# Patient Record
Sex: Male | Born: 1965
Health system: Southern US, Community
[De-identification: ages and names within clinical notes are randomized; demographics above are authoritative.]

## PROBLEM LIST (undated history)

## (undated) DIAGNOSIS — E785 Hyperlipidemia, unspecified: Secondary | ICD-10-CM

## (undated) DIAGNOSIS — G8929 Other chronic pain: Secondary | ICD-10-CM

## (undated) DIAGNOSIS — J309 Allergic rhinitis, unspecified: Secondary | ICD-10-CM

## (undated) DIAGNOSIS — F319 Bipolar disorder, unspecified: Secondary | ICD-10-CM

## (undated) DIAGNOSIS — J449 Chronic obstructive pulmonary disease, unspecified: Secondary | ICD-10-CM

## (undated) DIAGNOSIS — E7800 Pure hypercholesterolemia, unspecified: Secondary | ICD-10-CM

## (undated) DIAGNOSIS — I10 Essential (primary) hypertension: Secondary | ICD-10-CM

## (undated) DIAGNOSIS — M1990 Unspecified osteoarthritis, unspecified site: Secondary | ICD-10-CM

## (undated) DIAGNOSIS — F1021 Alcohol dependence, in remission: Secondary | ICD-10-CM

## (undated) DIAGNOSIS — K219 Gastro-esophageal reflux disease without esophagitis: Secondary | ICD-10-CM

## (undated) DIAGNOSIS — K317 Polyp of stomach and duodenum: Secondary | ICD-10-CM

## (undated) DIAGNOSIS — E782 Mixed hyperlipidemia: Secondary | ICD-10-CM

## (undated) DIAGNOSIS — I2510 Atherosclerotic heart disease of native coronary artery without angina pectoris: Secondary | ICD-10-CM

## (undated) DIAGNOSIS — K635 Polyp of colon: Secondary | ICD-10-CM

## (undated) HISTORY — DX: Alcohol dependence, in remission: F10.21

## (undated) HISTORY — DX: Other chronic pain: G89.29

## (undated) HISTORY — DX: Polyp of stomach and duodenum: K31.7

## (undated) HISTORY — PX: CATARACT EXTRACTION W/PHACO: SHX586

## (undated) HISTORY — DX: Mixed hyperlipidemia: E78.2

## (undated) HISTORY — PX: NO PAST SURGERIES: SHX2092

## (undated) HISTORY — DX: Allergic rhinitis, unspecified: J30.9

## (undated) HISTORY — DX: Atherosclerotic heart disease of native coronary artery without angina pectoris: I25.10

## (undated) HISTORY — DX: Polyp of colon: K63.5

## (undated) HISTORY — DX: Bipolar disorder, unspecified: F31.9

---

## 2010-05-10 ENCOUNTER — Ambulatory Visit (HOSPITAL_COMMUNITY): Admission: RE | Admit: 2010-05-10 | Discharge: 2010-05-10 | Payer: Self-pay | Admitting: Family Medicine

## 2015-03-16 ENCOUNTER — Ambulatory Visit (HOSPITAL_COMMUNITY)
Admission: RE | Admit: 2015-03-16 | Discharge: 2015-03-16 | Disposition: A | Discharge status: Home / Self Care | Payer: BLUE CROSS/BLUE SHIELD | Source: Ambulatory Visit | Attending: Family Medicine | Admitting: Family Medicine

## 2015-03-16 ENCOUNTER — Other Ambulatory Visit (HOSPITAL_COMMUNITY): Payer: Self-pay | Admitting: Family Medicine

## 2015-03-16 DIAGNOSIS — M545 Low back pain, unspecified: Secondary | ICD-10-CM

## 2015-03-16 DIAGNOSIS — M79605 Pain in left leg: Secondary | ICD-10-CM

## 2016-07-31 NOTE — H&P (Signed)
  NTS SOAP Note  Vital Signs:  Vitals as of: [id number]: Systolic 123456: Diastolic 90: Heart Rate 82: Temp 97.81F (Temporal): Height 41ft 2in: Weight 180Lbs 0 Ounces: BMI 23.11   BMI : 23.11 kg/m2  Subjective: This 50 year old male presents for of need for screening colonoscopy and reflux disease.  Never has had a colonoscopy.  No family h/o colon cancer.  Does have dysphagia and heartburn.  Prilosec somewhat helpful.  No weight loss, blood in stools, change in bowel habits.  Review of Symptoms:  Constitutional:negative Head:negative Eyes:negative sore throat Cardiovascular:negative Respiratory:negative Gastrointestinheartburn, dysphagia Genitourinary:negative joint and back pain Skin:negative Hematolgic/Lymphatic:negative Allergic/Immunologic:negative   Past Medical History:Reviewed  Past Medical History  Surgical History: none Medical Problems: reflux, chronic musculoskeletal pain, COPD Allergies: nkda Medications: fenofibrate, alprazolam, atorvastatin, omeprazole, baby asa, norco   Social History:Reviewed  Social History  Preferred Language: English Race:  White Ethnicity: Not Hispanic / Latino Age: 50 year Marital Status:  M Alcohol: 6 pack a day   Smoking Status: Light tobacco smoker reviewed on 07/31/2016 Started Date:  Packs per week:  Functional Status reviewed on 07/31/2016 ------------------------------------------------ Bathing: Normal Cooking: Normal Dressing: Normal Driving: Normal Eating: Normal Managing Meds: Normal Oral Care: Normal Shopping: Normal Toileting: Normal Transferring: Normal Walking: Normal Cognitive Status reviewed on 07/31/2016 ------------------------------------------------ Attention: Normal Decision Making: Normal Language: Normal Memory: Normal Motor: Normal Perception: Normal Problem Solving: Normal Visual and Spatial: Normal   Family History:Reviewed  Family Health History Mother  Father,  Living; Heart attack (myocardial infarction);  Brother, Living; Esophageal cancer;  Sister, Living; Breast cancer;     Objective Information: General:Well appearing, well nourished in no distress. Skin:no rash or prominent lesions Head:Atraumatic; no masses; no abnormalities Neck:Supple without lymphadenopathy.  Heart:RRR, no murmur or gallop.  Normal S1, S2.  No S3, S4.  Lungs:CTA bilaterally, no wheezes, rhonchi, rales.  Breathing unlabored. Abdomen:Soft, NT/ND, normal bowel sounds, no HSM, no masses.  No peritoneal signs. deferred to procedure  Assessment:Dysphagia, need for screening TCS  Diagnoses: 787.29  R13.19 Esophageal dysphagia (Other dysphagia)  Procedures: [id number] - OFFICE OUTPATIENT NEW 30 MINUTES    Plan:  Scheduled for EGD, TCS on 08/19/16.  Trilyte prescribed.   Patient Education:Alternative treatments to surgery were discussed with patient (and family).Risks and benefits  of procedure including bleeding and perforation were fully explained to the patient (and family) who gave informed consent. Patient/family questions were addressed.  Follow-up:Pending Surgery

## 2016-08-19 ENCOUNTER — Observation Stay (HOSPITAL_COMMUNITY)
Admission: EM | Admit: 2016-08-19 | Discharge: 2016-08-21 | Disposition: A | Discharge status: Home / Self Care | Payer: 59 | Attending: General Surgery | Admitting: General Surgery

## 2016-08-19 ENCOUNTER — Ambulatory Visit (HOSPITAL_COMMUNITY)
Admission: RE | Admit: 2016-08-19 | Discharge: 2016-08-19 | Disposition: A | Discharge status: Home / Self Care | Payer: 59 | Source: Ambulatory Visit | Attending: General Surgery | Admitting: General Surgery

## 2016-08-19 ENCOUNTER — Encounter (HOSPITAL_COMMUNITY)
Admission: RE | Disposition: A | Discharge status: Home / Self Care | Payer: Self-pay | Source: Ambulatory Visit | Attending: General Surgery

## 2016-08-19 ENCOUNTER — Encounter (HOSPITAL_COMMUNITY): Payer: Self-pay | Admitting: *Deleted

## 2016-08-19 ENCOUNTER — Emergency Department (HOSPITAL_COMMUNITY): Payer: 59

## 2016-08-19 DIAGNOSIS — G8929 Other chronic pain: Secondary | ICD-10-CM | POA: Insufficient documentation

## 2016-08-19 DIAGNOSIS — Z809 Family history of malignant neoplasm, unspecified: Secondary | ICD-10-CM | POA: Diagnosis not present

## 2016-08-19 DIAGNOSIS — Y839 Surgical procedure, unspecified as the cause of abnormal reaction of the patient, or of later complication, without mention of misadventure at the time of the procedure: Secondary | ICD-10-CM | POA: Insufficient documentation

## 2016-08-19 DIAGNOSIS — D62 Acute posthemorrhagic anemia: Secondary | ICD-10-CM

## 2016-08-19 DIAGNOSIS — K259 Gastric ulcer, unspecified as acute or chronic, without hemorrhage or perforation: Secondary | ICD-10-CM

## 2016-08-19 DIAGNOSIS — K264 Chronic or unspecified duodenal ulcer with hemorrhage: Secondary | ICD-10-CM

## 2016-08-19 DIAGNOSIS — R112 Nausea with vomiting, unspecified: Secondary | ICD-10-CM | POA: Insufficient documentation

## 2016-08-19 DIAGNOSIS — R52 Pain, unspecified: Secondary | ICD-10-CM | POA: Diagnosis present

## 2016-08-19 DIAGNOSIS — Z8249 Family history of ischemic heart disease and other diseases of the circulatory system: Secondary | ICD-10-CM | POA: Insufficient documentation

## 2016-08-19 DIAGNOSIS — Z1211 Encounter for screening for malignant neoplasm of colon: Secondary | ICD-10-CM | POA: Diagnosis not present

## 2016-08-19 DIAGNOSIS — Z932 Ileostomy status: Secondary | ICD-10-CM | POA: Diagnosis not present

## 2016-08-19 DIAGNOSIS — K91840 Postprocedural hemorrhage and hematoma of a digestive system organ or structure following a digestive system procedure: Secondary | ICD-10-CM | POA: Diagnosis not present

## 2016-08-19 DIAGNOSIS — K921 Melena: Secondary | ICD-10-CM

## 2016-08-19 DIAGNOSIS — Z8379 Family history of other diseases of the digestive system: Secondary | ICD-10-CM | POA: Diagnosis not present

## 2016-08-19 DIAGNOSIS — J449 Chronic obstructive pulmonary disease, unspecified: Secondary | ICD-10-CM | POA: Insufficient documentation

## 2016-08-19 DIAGNOSIS — E7800 Pure hypercholesterolemia, unspecified: Secondary | ICD-10-CM | POA: Diagnosis not present

## 2016-08-19 DIAGNOSIS — R1319 Other dysphagia: Secondary | ICD-10-CM | POA: Insufficient documentation

## 2016-08-19 DIAGNOSIS — I959 Hypotension, unspecified: Secondary | ICD-10-CM | POA: Diagnosis not present

## 2016-08-19 DIAGNOSIS — D125 Benign neoplasm of sigmoid colon: Secondary | ICD-10-CM | POA: Insufficient documentation

## 2016-08-19 DIAGNOSIS — K625 Hemorrhage of anus and rectum: Secondary | ICD-10-CM | POA: Diagnosis present

## 2016-08-19 DIAGNOSIS — R1310 Dysphagia, unspecified: Secondary | ICD-10-CM | POA: Insufficient documentation

## 2016-08-19 DIAGNOSIS — K219 Gastro-esophageal reflux disease without esophagitis: Secondary | ICD-10-CM | POA: Insufficient documentation

## 2016-08-19 DIAGNOSIS — K317 Polyp of stomach and duodenum: Secondary | ICD-10-CM | POA: Diagnosis not present

## 2016-08-19 DIAGNOSIS — K449 Diaphragmatic hernia without obstruction or gangrene: Secondary | ICD-10-CM | POA: Diagnosis not present

## 2016-08-19 DIAGNOSIS — E785 Hyperlipidemia, unspecified: Secondary | ICD-10-CM | POA: Diagnosis not present

## 2016-08-19 DIAGNOSIS — F1020 Alcohol dependence, uncomplicated: Secondary | ICD-10-CM | POA: Diagnosis not present

## 2016-08-19 DIAGNOSIS — F17210 Nicotine dependence, cigarettes, uncomplicated: Secondary | ICD-10-CM | POA: Insufficient documentation

## 2016-08-19 HISTORY — DX: Gastro-esophageal reflux disease without esophagitis: K21.9

## 2016-08-19 HISTORY — DX: Pure hypercholesterolemia, unspecified: E78.00

## 2016-08-19 HISTORY — PX: COLONOSCOPY: SHX5424

## 2016-08-19 HISTORY — PX: ESOPHAGOGASTRODUODENOSCOPY: SHX5428

## 2016-08-19 HISTORY — DX: Hyperlipidemia, unspecified: E78.5

## 2016-08-19 LAB — CBC WITH DIFFERENTIAL/PLATELET
BASOS ABS: 0 10*3/uL (ref 0.0–0.1)
BASOS PCT: 0 %
EOS ABS: 0 10*3/uL (ref 0.0–0.7)
EOS PCT: 0 %
HCT: 28.9 % — ABNORMAL LOW (ref 39.0–52.0)
Hemoglobin: 9.7 g/dL — ABNORMAL LOW (ref 13.0–17.0)
Lymphocytes Relative: 12 %
Lymphs Abs: 1.4 10*3/uL (ref 0.7–4.0)
MCH: 31.3 pg (ref 26.0–34.0)
MCHC: 33.6 g/dL (ref 30.0–36.0)
MCV: 93.2 fL (ref 78.0–100.0)
Monocytes Absolute: 0.6 10*3/uL (ref 0.1–1.0)
Monocytes Relative: 5 %
Neutro Abs: 9.8 10*3/uL — ABNORMAL HIGH (ref 1.7–7.7)
Neutrophils Relative %: 83 %
PLATELETS: 235 10*3/uL (ref 150–400)
RBC: 3.1 MIL/uL — AB (ref 4.22–5.81)
RDW: 13.6 % (ref 11.5–15.5)
WBC: 11.8 10*3/uL — AB (ref 4.0–10.5)

## 2016-08-19 LAB — I-STAT CHEM 8, ED
BUN: 24 mg/dL — AB (ref 6–20)
CREATININE: 1 mg/dL (ref 0.61–1.24)
Calcium, Ion: 1.21 mmol/L (ref 1.15–1.40)
Chloride: 101 mmol/L (ref 101–111)
Glucose, Bld: 201 mg/dL — ABNORMAL HIGH (ref 65–99)
HEMATOCRIT: 30 % — AB (ref 39.0–52.0)
Hemoglobin: 10.2 g/dL — ABNORMAL LOW (ref 13.0–17.0)
POTASSIUM: 4.1 mmol/L (ref 3.5–5.1)
Sodium: 137 mmol/L (ref 135–145)
TCO2: 23 mmol/L (ref 0–100)

## 2016-08-19 LAB — COMPREHENSIVE METABOLIC PANEL
ALT: 23 U/L (ref 17–63)
AST: 33 U/L (ref 15–41)
Albumin: 3.4 g/dL — ABNORMAL LOW (ref 3.5–5.0)
Alkaline Phosphatase: 43 U/L (ref 38–126)
Anion gap: 10 (ref 5–15)
BUN: 25 mg/dL — ABNORMAL HIGH (ref 6–20)
CHLORIDE: 103 mmol/L (ref 101–111)
CO2: 24 mmol/L (ref 22–32)
CREATININE: 0.99 mg/dL (ref 0.61–1.24)
Calcium: 9.1 mg/dL (ref 8.9–10.3)
GFR calc non Af Amer: >60 mL/min (ref >60)
Glucose, Bld: 207 mg/dL — ABNORMAL HIGH (ref 65–99)
Potassium: 4.1 mmol/L (ref 3.5–5.1)
SODIUM: 137 mmol/L (ref 135–145)
Total Bilirubin: 0.7 mg/dL (ref 0.3–1.2)
Total Protein: 6.1 g/dL — ABNORMAL LOW (ref 6.5–8.1)

## 2016-08-19 LAB — HEMOGLOBIN AND HEMATOCRIT, BLOOD
HEMATOCRIT: 24.3 % — AB (ref 39.0–52.0)
Hemoglobin: 8.2 g/dL — ABNORMAL LOW (ref 13.0–17.0)

## 2016-08-19 LAB — CBG MONITORING, ED: GLUCOSE-CAPILLARY: 205 mg/dL — AB (ref 65–99)

## 2016-08-19 LAB — I-STAT CG4 LACTIC ACID, ED: LACTIC ACID, VENOUS: 2.36 mmol/L — AB (ref 0.5–1.9)

## 2016-08-19 LAB — PROTIME-INR
INR: 1
Prothrombin Time: 13.2 seconds (ref 11.4–15.2)

## 2016-08-19 LAB — ABO/RH: ABO/RH(D): A NEG

## 2016-08-19 LAB — PREPARE RBC (CROSSMATCH)

## 2016-08-19 LAB — LACTIC ACID, PLASMA: LACTIC ACID, VENOUS: 0.9 mmol/L (ref 0.5–1.9)

## 2016-08-19 SURGERY — COLONOSCOPY
Anesthesia: Moderate Sedation

## 2016-08-19 MED ORDER — MIDAZOLAM HCL 5 MG/5ML IJ SOLN
INTRAMUSCULAR | Status: AC
  Filled 2016-08-19: qty 10

## 2016-08-19 MED ORDER — SIMETHICONE 80 MG PO CHEW: 40.0000 mg | CHEWABLE_TABLET | Freq: Four times a day (QID) | ORAL | Status: DC | PRN

## 2016-08-19 MED ORDER — FAMOTIDINE IN NACL 20-0.9 MG/50ML-% IV SOLN
20.0000 mg | Freq: Two times a day (BID) | INTRAVENOUS | Status: DC
  Administered 2016-08-19 – 2016-08-21 (×3): 20 mg via INTRAVENOUS
  Filled 2016-08-19 (×3): qty 50

## 2016-08-19 MED ORDER — HYDROCODONE-ACETAMINOPHEN 5-325 MG PO TABS: 1.0000 | ORAL_TABLET | ORAL | Status: DC | PRN

## 2016-08-19 MED ORDER — MEPERIDINE HCL 50 MG/ML IJ SOLN
INTRAMUSCULAR | Status: DC | PRN
  Administered 2016-08-19: 50 mg via INTRAVENOUS

## 2016-08-19 MED ORDER — MEPERIDINE HCL 50 MG/ML IJ SOLN
INTRAMUSCULAR | Status: AC
  Filled 2016-08-19: qty 1

## 2016-08-19 MED ORDER — MIDAZOLAM HCL 5 MG/5ML IJ SOLN
INTRAMUSCULAR | Status: DC | PRN
  Administered 2016-08-19: 1 mg via INTRAVENOUS
  Administered 2016-08-19: 4 mg via INTRAVENOUS
  Administered 2016-08-19: 1 mg via INTRAVENOUS

## 2016-08-19 MED ORDER — LORAZEPAM 2 MG/ML IJ SOLN
1.0000 mg | Freq: Four times a day (QID) | INTRAMUSCULAR | Status: DC | PRN
  Administered 2016-08-19: 1 mg via INTRAVENOUS
  Filled 2016-08-19: qty 1

## 2016-08-19 MED ORDER — ADULT MULTIVITAMIN W/MINERALS CH
1.0000 | ORAL_TABLET | Freq: Every day | ORAL | Status: DC
  Administered 2016-08-20 – 2016-08-21 (×2): 1 via ORAL
  Filled 2016-08-19 (×2): qty 1

## 2016-08-19 MED ORDER — SODIUM CHLORIDE 0.9 % IV SOLN
INTRAVENOUS | Status: DC
  Administered 2016-08-19: 21:00:00 via INTRAVENOUS

## 2016-08-19 MED ORDER — FOLIC ACID 1 MG PO TABS
1.0000 mg | ORAL_TABLET | Freq: Every day | ORAL | Status: DC
  Administered 2016-08-20 – 2016-08-21 (×2): 1 mg via ORAL
  Filled 2016-08-19 (×2): qty 1

## 2016-08-19 MED ORDER — SODIUM CHLORIDE 0.9 % IV SOLN: Freq: Once | INTRAVENOUS | Status: DC

## 2016-08-19 MED ORDER — ONDANSETRON 4 MG PO TBDP
4.0000 mg | ORAL_TABLET | Freq: Four times a day (QID) | ORAL | Status: DC | PRN
  Filled 2016-08-19: qty 1

## 2016-08-19 MED ORDER — THIAMINE HCL 100 MG/ML IJ SOLN: 100.0000 mg | Freq: Every day | INTRAMUSCULAR | Status: DC

## 2016-08-19 MED ORDER — VITAMIN B-1 100 MG PO TABS
100.0000 mg | ORAL_TABLET | Freq: Every day | ORAL | Status: DC
  Administered 2016-08-20 – 2016-08-21 (×2): 100 mg via ORAL
  Filled 2016-08-19 (×2): qty 1

## 2016-08-19 MED ORDER — BUTAMBEN-TETRACAINE-BENZOCAINE 2-2-14 % EX AERO
INHALATION_SPRAY | CUTANEOUS | Status: DC | PRN
  Administered 2016-08-19: 2 via TOPICAL

## 2016-08-19 MED ORDER — ACETAMINOPHEN 650 MG RE SUPP: 650.0000 mg | Freq: Four times a day (QID) | RECTAL | Status: DC | PRN

## 2016-08-19 MED ORDER — ACETAMINOPHEN 325 MG PO TABS: 650.0000 mg | ORAL_TABLET | Freq: Four times a day (QID) | ORAL | Status: DC | PRN

## 2016-08-19 MED ORDER — SODIUM CHLORIDE 0.9 % IV BOLUS (SEPSIS)
1000.0000 mL | Freq: Once | INTRAVENOUS | Status: AC
  Administered 2016-08-19: 1000 mL via INTRAVENOUS

## 2016-08-19 MED ORDER — ONDANSETRON HCL 4 MG/2ML IJ SOLN: 4.0000 mg | Freq: Four times a day (QID) | INTRAMUSCULAR | Status: DC | PRN

## 2016-08-19 MED ORDER — LORAZEPAM 1 MG PO TABS: 1.0000 mg | ORAL_TABLET | Freq: Four times a day (QID) | ORAL | Status: DC | PRN

## 2016-08-19 NOTE — Discharge Instructions (Signed)
Hiatal Hernia A hiatal hernia occurs when part of your stomach slides above the muscle that separates your abdomen from your chest (diaphragm). You can be born with a hiatal hernia (congenital), or it may develop over time. In almost all cases of hiatal hernia, only the top part of the stomach pushes through.  Many people have a hiatal hernia with no symptoms. The larger the hernia, the more likely that you will have symptoms. In some cases, a hiatal hernia allows stomach acid to flow back into the tube that carries food from your mouth to your stomach (esophagus). This may cause heartburn symptoms. Severe heartburn symptoms may mean you have developed a condition called gastroesophageal reflux disease (GERD).  CAUSES  Hiatal hernias are caused by a weakness in the opening (hiatus) where your esophagus passes through your diaphragm to attach to the upper part of your stomach. You may be born with a weakness in your hiatus, or a weakness can develop. RISK FACTORS Older age is a major risk factor for a hiatal hernia. Anything that increases pressure on your diaphragm can also increase your risk of a hiatal hernia. This includes:  Pregnancy.  Excess weight.  Frequent constipation. SIGNS AND SYMPTOMS  People with a hiatal hernia often have no symptoms. If symptoms develop, they are almost always caused by GERD. They may include:  Heartburn.  Belching.  Indigestion.  Trouble swallowing.  Coughing or wheezing.  Sore throat.  Hoarseness.  Chest pain. DIAGNOSIS  A hiatal hernia is sometimes found during an exam for another problem. Your health care provider may suspect a hiatal hernia if you have symptoms of GERD. Tests may be done to diagnose GERD. These may include:  X-rays of your stomach or chest.  An upper gastrointestinal (GI) series. This is an X-ray exam of your GI tract involving the use of a chalky liquid that you swallow. The liquid shows up clearly on the X-ray.  Endoscopy.  This is a procedure to look into your stomach using a thin, flexible tube that has a tiny camera and light on the end of it. TREATMENT  If you have no symptoms, you may not need treatment. If you have symptoms, treatment may include:  Dietary and lifestyle changes to help reduce GERD symptoms.  Medicines. These may include:  Over-the-counter antacids.  Medicines that make your stomach empty more quickly.  Medicines that block the production of stomach acid (H2 blockers).  Stronger medicines to reduce stomach acid (proton pump inhibitors).  You may need surgery to repair the hernia if other treatments are not helping. HOME CARE INSTRUCTIONS   Take all medicines as directed by your health care provider.  Quit smoking, if you smoke.  Try to achieve and maintain a healthy body weight.  Eat frequent small meals instead of three large meals a day. This keeps your stomach from getting too full.  Eat slowly.  Do not lie down right after eating.  Do noteat 1-2 hours before bed.   Do not drink beverages with caffeine. These include cola, coffee, cocoa, and tea.  Do not drink alcohol.  Avoid foods that can make symptoms of GERD worse. These may include:  Fatty foods.  Citrus fruits.  Other foods and drinks that contain acid.  Avoid putting pressure on your belly. Anything that puts pressure on your belly increases the amount of acid that may be pushed up into your esophagus.   Avoid bending over, especially after eating.  Raise the head of your bed  by putting blocks under the legs. This keeps your head and esophagus higher than your stomach.  Do not wear tight clothing around your chest or stomach.  Try not to strain when having a bowel movement, when urinating, or when lifting heavy objects. SEEK MEDICAL CARE IF:  Your symptoms are not controlled with medicines or lifestyle changes.  You are having trouble swallowing.  You have coughing or wheezing that will not  go away. SEEK IMMEDIATE MEDICAL CARE IF:  Your pain is getting worse.  Your pain spreads to your arms, neck, jaw, teeth, or back.  You have shortness of breath.  You sweat for no reason.  You feel sick to your stomach (nauseous) or vomit.  You vomit blood.  You have bright red blood in your stools.  You have black, tarry stools.    This information is not intended to replace advice given to you by your health care provider. Make sure you discuss any questions you have with your health care provider.   Document Released: 02/14/2004 Document Revised: 12/15/2014 Document Reviewed: 11/11/2013 Elsevier Interactive Patient Education 2016 Else[employer] Colon Polyps Polyps are lumps of extra tissue growing inside the body. Polyps can grow in the large intestine (colon). Most colon polyps are noncancerous (benign). However, some colon polyps can become cancerous over time. Polyps that are larger than a pea may be harmful. To be safe, caregivers remove and test all polyps. CAUSES  Polyps form when mutations in the genes cause your cells to grow and divide even though no more tissue is needed. RISK FACTORS There are a number of risk factors that can increase your chances of getting colon polyps. They include:  Being older than 50 years.  Family history of colon polyps or colon cancer.  Long-term colon diseases, such as colitis or Crohn disease.  Being overweight.  Smoking.  Being inactive.  Drinking too much alcohol. SYMPTOMS  Most small polyps do not cause symptoms. If symptoms are present, they may include:  Blood in the stool. The stool may look dark red or black.  Constipation or diarrhea that lasts longer than 1 week. DIAGNOSIS People often do not know they have polyps until their caregiver finds them during a regular checkup. Your caregiver can use 4 tests to check for polyps:  Digital rectal exam. The caregiver wears gloves and feels inside the rectum. This test  would find polyps only in the rectum.  Barium enema. The caregiver puts a liquid called barium into your rectum before taking X-rays of your colon. Barium makes your colon look white. Polyps are dark, so they are easy to see in the X-ray pictures.  Sigmoidoscopy. A thin, flexible tube (sigmoidoscope) is placed into your rectum. The sigmoidoscope has a light and tiny camera in it. The caregiver uses the sigmoidoscope to look at the last third of your colon.  Colonoscopy. This test is like sigmoidoscopy, but the caregiver looks at the entire colon. This is the most common method for finding and removing polyps. TREATMENT  Any polyps will be removed during a sigmoidoscopy or colonoscopy. The polyps are then tested for cancer. PREVENTION  To help lower your risk of getting more colon polyps:  Eat plenty of fruits and vegetables. Avoid eating fatty foods.  Do not smoke.  Avoid drinking alcohol.  Exercise every day.  Lose weight if recommended by your caregiver.  Eat plenty of calcium and folate. Foods that are rich in calcium include milk, cheese, and broccoli. Foods that are rich  in folate include chickpeas, kidney beans, and spinach. HOME CARE INSTRUCTIONS Keep all follow-up appointments as directed by your caregiver. You may need periodic exams to check for polyps. SEEK MEDICAL CARE IF: You notice bleeding during a bowel movement.   This information is not intended to replace advice given to you by your health care provider. Make sure you discuss any questions you have with your health care provider.   Document Released: 08/20/2004 Document Revised: 12/15/2014 Document Reviewed: 02/03/2012 Elsevier Interactive Patient Education 2016 Elsevier Inc. Colonoscopy, Care After Refer to this sheet in the next few weeks. These instructions provide you with information on caring for yourself after your procedure. Your health care provider may also give you more specific instructions. Your  treatment has been planned according to current medical practices, but problems sometimes occur. Call your health care provider if you have any problems or questions after your procedure. WHAT TO EXPECT AFTER THE PROCEDURE  After your procedure, it is typical to have the following:  A small amount of blood in your stool.  Moderate amounts of gas and mild abdominal cramping or bloating. HOME CARE INSTRUCTIONS  Do not drive, operate machinery, or sign important documents for 24 hours.  You may shower and resume your regular physical activities, but move at a slower pace for the first 24 hours.  Take frequent rest periods for the first 24 hours.  Walk around or put a warm pack on your abdomen to help reduce abdominal cramping and bloating.  Drink enough fluids to keep your urine clear or pale yellow.  You may resume your normal diet as instructed by your health care provider. Avoid heavy or fried foods that are hard to digest.  Avoid drinking alcohol for 24 hours or as instructed by your health care provider.  Only take over-the-counter or prescription medicines as directed by your health care provider.  If a tissue sample (biopsy) was taken during your procedure:  Do not take aspirin or blood thinners for 7 days, or as instructed by your health care provider.  Do not drink alcohol for 7 days, or as instructed by your health care provider.  Eat soft foods for the first 24 hours. SEEK MEDICAL CARE IF: You have persistent spotting of blood in your stool 2-3 days after the procedure. SEEK IMMEDIATE MEDICAL CARE IF:  You have more than a small spotting of blood in your stool.  You pass large blood clots in your stool.  Your abdomen is swollen (distended).  You have nausea or vomiting.  You have a fever.  You have increasing abdominal pain that is not relieved with medicine.   This information is not intended to replace advice given to you by your health care provider. Make  sure you discuss any questions you have with your health care provider.   Document Released: 07/08/2004 Document Revised: 09/14/2013 Document Reviewed: 08/01/2013 Elsevier Interactive Patient Education [employer].

## 2016-08-19 NOTE — H&P (Signed)
Paul Arias is an 50 y.o. male.   Chief Complaint: Blood per rectum HPI: Patient is a 50 year old white male status post ileostomy with multiple polypectomies, EGD with duodenal polyp removal earlier today who began experiencing 3 episodes of vomiting and 4 episodes of bloody stools. He states that the vomitus did not have blood in it. He felt weak and presented emergency room for further evaluation treatment after he had called me at home. He states he has not had emesis since early this afternoon. He did have a bloody bowel movement just before he presented to the emergency room.  Past Medical History:  Diagnosis Date  . GERD (gastroesophageal reflux disease)   . Hypercholesteremia   . Hyperlipidemia     Past Surgical History:  Procedure Laterality Date  . NO PAST SURGERIES      Family History  Problem Relation Age of Onset  . Cirrhosis Mother   . Heart disease Father   . Cancer Brother    Social History:  reports that he has been smoking Cigarettes.  He has a 10.00 pack-year smoking history. He has never used smokeless tobacco. He reports that he drinks about 10.8 oz of alcohol per week . He reports that he does not use drugs.  Allergies: Not on File   (Not in a hospital admission)  Results for orders placed or performed during the hospital encounter of 08/19/16 (from the past 48 hour(s))  CBG monitoring, ED     Status: Abnormal   Collection Time: 08/19/16  5:34 PM  Result Value Ref Range   Glucose-Capillary 205 (H) 65 - 99 mg/dL  CBC with Differential     Status: Abnormal   Collection Time: 08/19/16  5:38 PM  Result Value Ref Range   WBC 11.8 (H) 4.0 - 10.5 K/uL   RBC 3.10 (L) 4.22 - 5.81 MIL/uL   Hemoglobin 9.7 (L) 13.0 - 17.0 g/dL   HCT 28.9 (L) 39.0 - 52.0 %   MCV 93.2 78.0 - 100.0 fL   MCH 31.3 26.0 - 34.0 pg   MCHC 33.6 30.0 - 36.0 g/dL   RDW 13.6 11.5 - 15.5 %   Platelets 235 150 - 400 K/uL   Neutrophils Relative % 83 %   Neutro Abs 9.8 (H) 1.7 - 7.7  K/uL   Lymphocytes Relative 12 %   Lymphs Abs 1.4 0.7 - 4.0 K/uL   Monocytes Relative 5 %   Monocytes Absolute 0.6 0.1 - 1.0 K/uL   Eosinophils Relative 0 %   Eosinophils Absolute 0.0 0.0 - 0.7 K/uL   Basophils Relative 0 %   Basophils Absolute 0.0 0.0 - 0.1 K/uL  Comprehensive metabolic panel     Status: Abnormal   Collection Time: 08/19/16  5:38 PM  Result Value Ref Range   Sodium 137 135 - 145 mmol/L   Potassium 4.1 3.5 - 5.1 mmol/L   Chloride 103 101 - 111 mmol/L   CO2 24 22 - 32 mmol/L   Glucose, Bld 207 (H) 65 - 99 mg/dL   BUN 25 (H) 6 - 20 mg/dL   Creatinine, Ser 0.99 0.61 - 1.24 mg/dL   Calcium 9.1 8.9 - 10.3 mg/dL   Total Protein 6.1 (L) 6.5 - 8.1 g/dL   Albumin 3.4 (L) 3.5 - 5.0 g/dL   AST 33 15 - 41 U/L   ALT 23 17 - 63 U/L   Alkaline Phosphatase 43 38 - 126 U/L   Total Bilirubin 0.7 0.3 - 1.2 mg/dL  GFR calc non Af Amer >60 >60 mL/min   GFR calc Af Amer >60 >60 mL/min    Comment: (NOTE) The eGFR has been calculated using the CKD EPI equation. This calculation has not been validated in all clinical situations. eGFR's persistently <60 mL/min signify possible Chronic Kidney Disease.    Anion gap 10 5 - 15  Protime-INR     Status: None   Collection Time: 08/19/16  5:38 PM  Result Value Ref Range   Prothrombin Time 13.2 11.4 - 15.2 seconds   INR 1.00   I-stat chem 8, ed     Status: Abnormal   Collection Time: 08/19/16  5:46 PM  Result Value Ref Range   Sodium 137 135 - 145 mmol/L   Potassium 4.1 3.5 - 5.1 mmol/L   Chloride 101 101 - 111 mmol/L   BUN 24 (H) 6 - 20 mg/dL   Creatinine, Ser 1.00 0.61 - 1.24 mg/dL   Glucose, Bld 201 (H) 65 - 99 mg/dL   Calcium, Ion 1.21 1.15 - 1.40 mmol/L   TCO2 23 0 - 100 mmol/L   Hemoglobin 10.2 (L) 13.0 - 17.0 g/dL   HCT 30.0 (L) 39.0 - 52.0 %  I-Stat CG4 Lactic Acid, ED     Status: Abnormal   Collection Time: 08/19/16  5:46 PM  Result Value Ref Range   Lactic Acid, Venous 2.36 (HH) 0.5 - 1.9 mmol/L   No results  found.  Review of Systems  Constitutional: Positive for malaise/fatigue.  HENT: Negative.   Eyes: Negative.   Respiratory: Negative.   Cardiovascular: Negative.   Gastrointestinal: Positive for blood in stool and nausea.  Genitourinary: Negative.   Musculoskeletal: Negative.   Skin: Negative.   Neurological: Negative.   Endo/Heme/Allergies: Negative.   Psychiatric/Behavioral: Negative.   All other systems reviewed and are negative.   Blood pressure 100/88, pulse 93, resp. rate (!) 40, height 6' 2" (1.88 m), weight 81.6 kg (180 lb), SpO2 96 %. Physical Exam  Vitals reviewed. Constitutional: He is oriented to person, place, and time. He appears well-developed and well-nourished.  HENT:  Head: Normocephalic and atraumatic.  Neck: Normal range of motion. Neck supple. No thyromegaly present.  Cardiovascular: Normal rate, regular rhythm and normal heart sounds.   Respiratory: Effort normal and breath sounds normal.  GI: Soft. He exhibits no distension. There is no tenderness. There is no rebound and no guarding.  Neurological: He is alert and oriented to person, place, and time.  Skin: Skin is warm and dry.     Assessment/Plan Impression: Blood per rectum, status post colonoscopy with polypectomy. Suspect polypectomy bleed. His vital signs are improved after receiving fluid. His hemoglobin is at 10. I doubt he has had an upper GI bleed as he had no blood in his emesis. Chest x-ray and lateral decubitus abdominal films are negative for free air. Plan: We'll bring the patient into the hospital for observation. He has been typed and crossed for 2 units. The patient understands and agrees to the treatment plan. Should he continue to bleed, a repeat endoscopy would be indicated.  , A, MD 08/19/2016, 6:25 PM   

## 2016-08-19 NOTE — Interval H&P Note (Signed)
History and Physical Interval Note:  08/19/2016 7:27 AM  Paul Arias  has presented today for surgery, with the diagnosis of screening,dysphagia  The various methods of treatment have been discussed with the patient and family. After consideration of risks, benefits and other options for treatment, the patient has consented to  Procedure(s): COLONOSCOPY (N/A) ESOPHAGOGASTRODUODENOSCOPY (EGD) (N/A) as a surgical intervention .  The patient's history has been reviewed, patient examined, no change in status, stable for surgery.  I have reviewed the patient's chart and labs.  Questions were answered to the patient's satisfaction.     Aviva Signs A

## 2016-08-19 NOTE — ED Triage Notes (Signed)
Pt comes in for weakness. Pt had an endoscopy done this morning. Since he was at home he had 3 episodes of vomiting and states he has been having bloody stool at home. MD Mesner at bedside.

## 2016-08-19 NOTE — Op Note (Signed)
[facility] Patient Name: Paul Arias Procedure Date: 08/19/2016 7:51 AM MRN: [id number] Date of Birth: 07/06/1966 Attending MD: Aviva Signs , MD CSN: [id number] Age: 50 Admit Type: Outpatient Procedure:                Upper GI endoscopy Indications:              Dysphagia Providers:                Aviva Signs, MD, Lurline Del, RN, [facility],                            Technician Referring MD:              Medicines:                Midazolam 7 mg IV, Droperidol 0 mg IV, Meperidine                            50 mg IV, Cetacaine spray Complications:            No immediate complications. Estimated Blood Loss:     Estimated blood loss: none. Procedure:                Pre-Anesthesia Assessment:                           - Prior to the procedure, a History and Physical                            was performed, and patient medications and                            allergies were reviewed. The patient is competent.                            The risks and benefits of the procedure and the                            sedation options and risks were discussed with the                            patient. All questions were answered and informed                            consent was obtained. Patient identification and                            proposed procedure were verified by the nurse in                            the procedure room. Mental Status Examination:                            alert and oriented. Airway Examination: normal  oropharyngeal airway and neck mobility. Respiratory                            Examination: clear to auscultation. CV Examination:                            RRR, no murmurs, no S3 or S4. Prophylactic                            Antibiotics: The patient does not require                            prophylactic antibiotics. Prior Anticoagulants: The                            patient has taken no previous anticoagulant or                             antiplatelet agents. ASA Grade Assessment: II - A                            patient with mild systemic disease. After reviewing                            the risks and benefits, the patient was deemed in                            satisfactory condition to undergo the procedure.                            The anesthesia plan was to use moderate sedation /                            analgesia (conscious sedation). Immediately prior                            to administration of medications, the patient was                            re-assessed for adequacy to receive sedatives. The                            heart rate, respiratory rate, oxygen saturations,                            blood pressure, adequacy of pulmonary ventilation,                            and response to care were monitored throughout the                            procedure. The physical status of the patient was  re-assessed after the procedure.                           - Prior to the procedure, a History and Physical                            was performed, and patient medications and                            allergies were reviewed. The patient is competent.                            The risks and benefits of the procedure and the                            sedation options and risks were discussed with the                            patient. All questions were answered and informed                            consent was obtained. Patient identification and                            proposed procedure were verified by the physician                            and the nurse in the procedure room. Mental Status                            Examination: alert and oriented. Airway                            Examination: normal oropharyngeal airway and neck                            mobility. Respiratory Examination: clear to                             auscultation. CV Examination: RRR, no murmurs, no                            S3 or S4. Prophylactic Antibiotics: The patient                            does not require prophylactic antibiotics. Prior                            Anticoagulants: The patient has taken no previous                            anticoagulant or antiplatelet agents. ASA Grade  Assessment: II - A patient with mild systemic                            disease. After reviewing the risks and benefits,                            the patient was deemed in satisfactory condition to                            undergo the procedure. The anesthesia plan was to                            use moderate sedation / analgesia (conscious                            sedation). Immediately prior to administration of                            medications, the patient was re-assessed for                            adequacy to receive sedatives. The heart rate,                            respiratory rate, oxygen saturations, blood                            pressure, adequacy of pulmonary ventilation, and                            response to care were monitored throughout the                            procedure. The physical status of the patient was                            re-assessed after the procedure.                           After obtaining informed consent, the endoscope was                            passed under direct vision. Throughout the                            procedure, the patient's blood pressure, pulse, and                            oxygen saturations were monitored continuously. The                            [id number]) was introduced through the  mouth, and advanced to the third part of duodenum.                            The upper GI endoscopy was accomplished with ease.                            The patient tolerated the procedure well. Scope  In: 7:53:45 AM Scope Out: 8:09:32 AM Total Procedure Duration: 0 hours 15 minutes 47 seconds  Findings:      Localized villous appearing mucosa was found in the first portion of the       duodenum. The polyp was removed with a hot snare. Resection was       complete, and retrieval was complete. Estimated blood loss: none.      A medium-sized hiatal hernia was present. This was biopsied with a cold       forceps for Helicobacter pylori testing using CLOtest. Estimated blood       loss: none.      No endoscopic abnormality was evident in the esophagus to explain the       patient's complaint of dysphagia.      The exam was otherwise without abnormality. Impression:               - Villous appearing mucosa in the first portion of                            the duodenum.                           - Medium-sized hiatal hernia. Biopsied.                           - No endoscopic esophageal abnormality to explain                            patient's dysphagia.                           - The examination was otherwise normal. Moderate Sedation:      Moderate (conscious) sedation was administered by the endoscopy nurse       and supervised by the endoscopist. The following parameters were       monitored: oxygen saturation, heart rate, blood pressure, and response       to care. Recommendation:           - Written discharge instructions were provided to                            the patient.                           - The signs and symptoms of potential delayed                            complications were discussed with the patient.                           - Patient has a contact  number available for                            emergencies.                           - Return to normal activities tomorrow.                           - Return to normal activities tomorrow.                           - Resume previous diet.                           - Continue present medications.                            - Await pathology results.                           - Await pathology results. Procedure Code(s):        --- Professional ---                           [phone], Esophagogastroduodenoscopy, flexible,                            transoral; with removal of tumor(s), polyp(s), or                            other lesion(s) by snare technique Diagnosis Code(s):        --- Professional ---                           K31.89, Other diseases of stomach and duodenum                           K44.9, Diaphragmatic hernia without obstruction or                            gangrene                           R13.10, Dysphagia, unspecified CPT copyright 2016 American Medical Association. All rights reserved. The codes documented in this report are preliminary and upon coder review may  be revised to meet current compliance requirements. Aviva Signs, MD Aviva Signs, MD 08/19/2016 8:27:43 AM This report has been signed electronically. Number of Addenda: 0

## 2016-08-19 NOTE — ED Provider Notes (Signed)
[facility] DEPT Provider Note   CSN: [id number] Arrival date & time: 08/19/16  1727     History   Chief Complaint Chief Complaint  Patient presents with  . Weakness    HPI Paul Arias is a 50 y.o. male.  50 year old male with history of alcoholism and dysphasia had a colonoscopy and EGD done earlier today. Shortly afterwards he started developing weakness and blood in his stools. He continue more more weak and confused so the family brought him here for further evaluation. Patient is not in any pain. He is also 3 episodes of vomiting. He's had bright red blood in his stool approximately 3 or 4 times. No history of the same. No exacerbating or relieving factors.      Past Medical History:  Diagnosis Date  . GERD (gastroesophageal reflux disease)   . Hypercholesteremia   . Hyperlipidemia     There are no active problems to display for this patient.   Past Surgical History:  Procedure Laterality Date  . NO PAST SURGERIES         Home Medications    Prior to Admission medications   Medication Sig Start Date End Date Taking? Authorizing Provider  ALPRAZolam Duanne Moron) 1 MG tablet Take 1 mg by mouth at bedtime.  07/25/16   Historical Provider, MD  atorvastatin (LIPITOR) 40 MG tablet Take 40 mg by mouth daily at 6 PM.  07/11/16   Historical Provider, MD  fenofibrate (TRICOR) 145 MG tablet Take 145 mg by mouth daily.  07/11/16   Historical Provider, MD  HYDROcodone-acetaminophen (NORCO) 10-325 MG tablet Take 1 tablet by mouth every 6 (six) hours as needed.  07/11/16   Historical Provider, MD  omeprazole (PRILOSEC) 20 MG capsule Take 20 mg by mouth daily.  07/22/16   Historical Provider, MD    Family History Family History  Problem Relation Age of Onset  . Cirrhosis Mother   . Heart disease Father   . Cancer Brother     Social History Social History  Substance Use Topics  . Smoking status: Current Every Day Smoker    Packs/day: 0.25    Years: 40.00    Types:  Cigarettes  . Smokeless tobacco: Never Used  . Alcohol use 10.8 oz/week    14 Cans of beer, 4 Shots of liquor per week     Allergies   Review of patient's allergies indicates not on file.   Review of Systems Review of Systems  All other systems reviewed and are negative.    Physical Exam Updated Vital Signs BP 110/82 (BP Location: Left Arm)   Pulse 93   Resp 16   Ht 6\' 2"  (1.88 m)   Wt 180 lb (81.6 kg)   SpO2 96%   BMI 23.11 kg/m   Physical Exam  Constitutional: He is oriented to person, place, and time. He appears well-developed and well-nourished.  HENT:  Head: Normocephalic and atraumatic.  Eyes: Conjunctivae are normal.  Neck: Neck supple.  Cardiovascular: Normal rate and regular rhythm.   No murmur heard. Pulmonary/Chest: Effort normal and breath sounds normal. No respiratory distress.  Abdominal: Soft. There is no tenderness.  Musculoskeletal: Normal range of motion. He exhibits no edema or deformity.  Neurological: He is alert and oriented to person, place, and time.  Skin: Skin is warm and dry. There is pallor.  Psychiatric: He has a normal mood and affect.  Nursing note and vitals reviewed.    ED Treatments / Results  Labs (all labs ordered  are listed, but only abnormal results are displayed) Labs Reviewed  CBC WITH DIFFERENTIAL/PLATELET - Abnormal; Notable for the following:       Result Value   WBC 11.8 (*)    RBC 3.10 (*)    Hemoglobin 9.7 (*)    HCT 28.9 (*)    Neutro Abs 9.8 (*)    All other components within normal limits  COMPREHENSIVE METABOLIC PANEL - Abnormal; Notable for the following:    Glucose, Bld 207 (*)    BUN 25 (*)    Total Protein 6.1 (*)    Albumin 3.4 (*)    All other components within normal limits  HEMOGLOBIN AND HEMATOCRIT, BLOOD - Abnormal; Notable for the following:    Hemoglobin 8.2 (*)    HCT 24.3 (*)    All other components within normal limits  I-STAT CHEM 8, ED - Abnormal; Notable for the following:    BUN  24 (*)    Glucose, Bld 201 (*)    Hemoglobin 10.2 (*)    HCT 30.0 (*)    All other components within normal limits  CBG MONITORING, ED - Abnormal; Notable for the following:    Glucose-Capillary 205 (*)    All other components within normal limits  I-STAT CG4 LACTIC ACID, ED - Abnormal; Notable for the following:    Lactic Acid, Venous 2.36 (*)    All other components within normal limits  PROTIME-INR  LACTIC ACID, PLASMA  BASIC METABOLIC PANEL  CBC  TYPE AND SCREEN  PREPARE RBC (CROSSMATCH)  ABO/RH    EKG  EKG Interpretation None       Radiology Dg Chest Portable 1 View  Result Date: 08/19/2016 CLINICAL DATA:  Post colonoscopy and EGD today. Rectal bleeding. Evaluate for free air. EXAM: PORTABLE CHEST 1 VIEW COMPARISON:  Chest radiograph 05/10/2010 FINDINGS: The cardiomediastinal contours are normal. Stable chronic hyperinflation. No pneumomediastinum. Pulmonary vasculature is normal. No consolidation, pleural effusion, or pneumothorax. No acute osseous abnormalities are seen. No evidence of free air under the diaphragms. IMPRESSION: 1. No evidence of free air under the diaphragms. No pneumomediastinum. No acute chest findings. 2. Chronic hyperinflation. Electronically Signed   By: Jeb Levering M.D.   On: 08/19/2016 18:43   Dg Abd Portable 1v  Result Date: 08/19/2016 CLINICAL DATA:  Pain. Post EGD and colonoscopy today. Rectal bleeding. EXAM: PORTABLE ABDOMEN - 1 VIEW COMPARISON:  None. FINDINGS: Portable lateral decubitus view of the abdomen. No evidence of free air. Visualized bowel gas pattern is grossly normal. IMPRESSION: No evidence of free intra-abdominal air on lateral decubitus view. Electronically Signed   By: Jeb Levering M.D.   On: 08/19/2016 18:45    Procedures Procedures (including critical care time)  Medications Ordered in ED Medications  sodium chloride 0.9 % bolus 1,000 mL (not administered)     Initial Impression / Assessment and Plan / ED  Course  I have reviewed the triage vital signs and the nursing notes.  Pertinent labs & imaging results that were available during my care of the patient were reviewed by me and considered in my medical decision making (see chart for details).  Per notes, it appears he had polypectomies done, suspect they are bleeding. Plan for type/screen, istat chem 8, fluids and d/w Dr. Arnoldo Morale.   Clinical Course  Value Comment By Time  Hemoglobin: (!) 9.7 Likely below his normal type/cross/transfusion ordered. Dr. Arnoldo Morale already consulted.  Merrily Pew, MD 09/12 1810   Per Dr. Arnoldo Morale, will not transfuse blood,  but will keep 2U type/cross. Verbally instructed nurse and d/c'ed order.  Merrily Pew, MD 09/12 1825     Final Clinical Impressions(s) / ED Diagnoses   Final diagnoses:  Hematochezia  Acute blood loss anemia    New Prescriptions New Prescriptions   No medications on file     Merrily Pew, MD 08/20/16 0023

## 2016-08-19 NOTE — Op Note (Signed)
[facility] Patient Name: Paul Arias Procedure Date: 08/19/2016 7:28 AM MRN: [id number] Date of Birth: [birth year] Attending MD: Aviva Signs , MD CSN: [id number] Age: 50 Admit Type: Outpatient Procedure:                Colonoscopy Indications:              Screening for colorectal malignant neoplasm Providers:                Aviva Signs, MD, Lurline Del, RN, [place],                            [employer] Referring MD:              Medicines:                Midazolam 7 mg IV, Meperidine 50 mg IV Complications:            No immediate complications. Estimated Blood Loss:     Estimated blood loss: none. Procedure:                Pre-Anesthesia Assessment:                           - Prior to the procedure, a History and Physical                            was performed, and patient medications and                            allergies were reviewed. The patient is competent.                            The risks and benefits of the procedure and the                            sedation options and risks were discussed with the                            patient. All questions were answered and informed                            consent was obtained. Patient identification and                            proposed procedure were verified by the nurse in                            the procedure room. Mental Status Examination:                            alert and oriented. Airway Examination: normal                            oropharyngeal airway and neck mobility. Respiratory  Examination: clear to auscultation. CV Examination:                            RRR, no murmurs, no S3 or S4. Prophylactic                            Antibiotics: The patient does not require                            prophylactic antibiotics. Prior Anticoagulants: The                            patient has taken no previous anticoagulant or   antiplatelet agents. ASA Grade Assessment: II - A                            patient with mild systemic disease. After reviewing                            the risks and benefits, the patient was deemed in                            satisfactory condition to undergo the procedure.                            The anesthesia plan was to use moderate sedation /                            analgesia (conscious sedation). Immediately prior                            to administration of medications, the patient was                            re-assessed for adequacy to receive sedatives. The                            heart rate, respiratory rate, oxygen saturations,                            blood pressure, adequacy of pulmonary ventilation,                            and response to care were monitored throughout the                            procedure. The physical status of the patient was                            re-assessed after the procedure.                           After obtaining informed consent, the colonoscope  was passed under direct vision. Throughout the                            procedure, the patient's blood pressure, pulse, and                            oxygen saturations were monitored continuously. The                            [id number] scope was introduced through                            the anus and advanced to the the cecum, identified                            by the appendiceal orifice, ileocecal valve and                            palpation. The entire colon was well visualized. No                            anatomical landmarks were photographed. The                            colonoscopy was performed with ease. The quality of                            the bowel preparation was adequate. Scope In: 7:33:24 AM Scope Out: 7:48:30 AM Scope Withdrawal Time: 0 hours 11 minutes 33 seconds  Total Procedure Duration: 0 hours  15 minutes 6 seconds  Findings:      Two semi-pedunculated polyps were found in the distal sigmoid colon. The       polyps were 2 to 5 mm in size. These polyps were removed with a hot       snare. Resection and retrieval were complete. Estimated blood loss: none.      The exam was otherwise without abnormality on direct and retroflexion       views. Impression:               - Two 2 to 5 mm polyps in the distal sigmoid colon,                            removed with a hot snare. Resected and retrieved.                           - The examination was otherwise normal on direct                            and retroflexion views. Moderate Sedation:      Moderate (conscious) sedation was administered by the endoscopy nurse       and supervised by the endoscopist. The following parameters were       monitored: oxygen saturation, heart rate, blood pressure, and response       to care. Total physician intraservice time  was 15 minutes. Recommendation:           - Written discharge instructions were provided to                            the patient.                           - The signs and symptoms of potential delayed                            complications were discussed with the patient.                           - Patient has a contact number available for                            emergencies.                           - Return to normal activities tomorrow.                           - Resume previous diet.                           - Await pathology results.                           - Repeat colonoscopy for adenoma surveillance.                           - Continue present medications. Procedure Code(s):        --- Professional ---                           [phone], Colonoscopy, flexible; with removal of                            tumor(s), polyp(s), or other lesion(s) by snare                            technique                           99152, Moderate sedation services provided by  the                            same physician or other qualified health care                            professional performing the diagnostic or                            therapeutic service that the sedation supports,                            requiring  the presence of an independent trained                            observer to assist in the monitoring of the                            patient's level of consciousness and physiological                            status; initial 15 minutes of intraservice time,                            patient age 2 years or older Diagnosis Code(s):        --- Professional ---                           Z12.11, Encounter for screening for malignant                            neoplasm of colon                           D12.5, Benign neoplasm of sigmoid colon CPT copyright 2016 American Medical Association. All rights reserved. The codes documented in this report are preliminary and upon coder review may  be revised to meet current compliance requirements. Aviva Signs, MD Aviva Signs, MD 08/19/2016 8:20:33 AM This report has been signed electronically. Number of Addenda: 0

## 2016-08-20 ENCOUNTER — Observation Stay (HOSPITAL_COMMUNITY): Payer: 59 | Admitting: Anesthesiology

## 2016-08-20 ENCOUNTER — Encounter (HOSPITAL_COMMUNITY)
Admission: EM | Disposition: A | Discharge status: Home / Self Care | Payer: Self-pay | Source: Home / Self Care | Attending: Emergency Medicine

## 2016-08-20 HISTORY — PX: FLEXIBLE SIGMOIDOSCOPY: SHX5431

## 2016-08-20 HISTORY — PX: ESOPHAGOGASTRODUODENOSCOPY (EGD) WITH PROPOFOL: SHX5813

## 2016-08-20 LAB — BASIC METABOLIC PANEL
ANION GAP: 7 (ref 5–15)
BUN: 15 mg/dL (ref 6–20)
CO2: 23 mmol/L (ref 22–32)
Calcium: 8.3 mg/dL — ABNORMAL LOW (ref 8.9–10.3)
Chloride: 107 mmol/L (ref 101–111)
Creatinine, Ser: 0.72 mg/dL (ref 0.61–1.24)
Glucose, Bld: 105 mg/dL — ABNORMAL HIGH (ref 65–99)
POTASSIUM: 3.6 mmol/L (ref 3.5–5.1)
SODIUM: 137 mmol/L (ref 135–145)

## 2016-08-20 LAB — CBC
HEMATOCRIT: 22.5 % — AB (ref 39.0–52.0)
Hemoglobin: 7.7 g/dL — ABNORMAL LOW (ref 13.0–17.0)
MCH: 31.4 pg (ref 26.0–34.0)
MCHC: 34.2 g/dL (ref 30.0–36.0)
MCV: 91.8 fL (ref 78.0–100.0)
Platelets: 196 10*3/uL (ref 150–400)
RBC: 2.45 MIL/uL — AB (ref 4.22–5.81)
RDW: 13.7 % (ref 11.5–15.5)
WBC: 6.7 10*3/uL (ref 4.0–10.5)

## 2016-08-20 LAB — MRSA PCR SCREENING: MRSA BY PCR: NEGATIVE

## 2016-08-20 LAB — CLOTEST (H. PYLORI), BIOPSY: HELICOBACTER SCREEN: NEGATIVE

## 2016-08-20 SURGERY — SIGMOIDOSCOPY, FLEXIBLE
Anesthesia: Monitor Anesthesia Care

## 2016-08-20 MED ORDER — SODIUM CHLORIDE 0.9 % IV SOLN: Freq: Once | INTRAVENOUS | Status: DC

## 2016-08-20 MED ORDER — SODIUM CHLORIDE 0.9 % IJ SOLN
INTRAMUSCULAR | Status: DC | PRN
  Administered 2016-08-20: 10 mL

## 2016-08-20 MED ORDER — EPINEPHRINE HCL 0.1 MG/ML IJ SOSY
PREFILLED_SYRINGE | INTRAMUSCULAR | Status: AC
  Filled 2016-08-20: qty 10

## 2016-08-20 MED ORDER — MIDAZOLAM HCL 5 MG/5ML IJ SOLN
INTRAMUSCULAR | Status: DC | PRN
  Administered 2016-08-20: 2 mg via INTRAVENOUS

## 2016-08-20 MED ORDER — SODIUM CHLORIDE 0.9 % IV SOLN
INTRAVENOUS | Status: DC
  Administered 2016-08-20 – 2016-08-21 (×2): via INTRAVENOUS

## 2016-08-20 MED ORDER — LACTATED RINGERS IV SOLN
INTRAVENOUS | Status: DC | PRN
  Administered 2016-08-20: 14:00:00 via INTRAVENOUS

## 2016-08-20 MED ORDER — PROPOFOL 500 MG/50ML IV EMUL
INTRAVENOUS | Status: DC | PRN
  Administered 2016-08-20 (×4): via INTRAVENOUS
  Administered 2016-08-20: 200 ug/kg/min via INTRAVENOUS
  Administered 2016-08-20: 15:00:00 via INTRAVENOUS

## 2016-08-20 MED ORDER — PROPOFOL 10 MG/ML IV BOLUS
INTRAVENOUS | Status: AC
  Filled 2016-08-20: qty 20

## 2016-08-20 MED ORDER — FENTANYL CITRATE (PF) 100 MCG/2ML IJ SOLN: 25.0000 ug | INTRAMUSCULAR | Status: DC | PRN

## 2016-08-20 MED ORDER — LACTATED RINGERS IV SOLN: INTRAVENOUS | Status: DC

## 2016-08-20 MED ORDER — MIDAZOLAM HCL 2 MG/2ML IJ SOLN: 1.0000 mg | INTRAMUSCULAR | Status: DC | PRN

## 2016-08-20 MED ORDER — HYDROMORPHONE HCL 1 MG/ML IJ SOLN: 0.2500 mg | INTRAMUSCULAR | Status: DC | PRN

## 2016-08-20 MED ORDER — FENTANYL CITRATE (PF) 100 MCG/2ML IJ SOLN
INTRAMUSCULAR | Status: DC | PRN
  Administered 2016-08-20 (×4): 25 ug via INTRAVENOUS

## 2016-08-20 MED ORDER — SODIUM CHLORIDE 0.9 % IV SOLN: INTRAVENOUS | Status: DC

## 2016-08-20 MED ORDER — MIDAZOLAM HCL 2 MG/2ML IJ SOLN
INTRAMUSCULAR | Status: AC
  Filled 2016-08-20: qty 2

## 2016-08-20 NOTE — Care Management Note (Signed)
Case Management Note  Patient Details  Name: Paul Arias MRN: [id number] Date of Birth: [birth year]  Subjective/Objective:                  Pt admitted with GIB after having a colonoscopy yesterday. Chart reviewed for CM needs. Pt is from home, lives with family and is ind with ADL's. Family is at bedside. Pt has PCP, transportation and insurance with drug coverage. He has no HH services or DME needs PTA. He plans to return home with self care at DC.   Action/Plan: No CM needs anticipated.   Expected Discharge Date:     08/22/2016             Expected Discharge Plan:  Home/Self Care  In-House Referral:  NA  Discharge planning Services  CM Consult  Post Acute Care Choice:  NA Choice offered to:  NA  DME Arranged:    DME Agency:     HH Arranged:    HH Agency:     Status of Service:  In process, will continue to follow  If discussed at Long Length of Stay Meetings, dates discussed:    Additional Comments:  Sherald Barge, RN 08/20/2016, 1:29 PM

## 2016-08-20 NOTE — Op Note (Signed)
[facility] Patient Name: Paul Arias Procedure Date: 08/20/2016 8:51 AM MRN: [id number] Date of Birth: 04-06-1966 Attending MD: Aviva Signs , MD CSN: [id number] Age: 51 Admit Type: Inpatient Procedure:                Flexible Sigmoidoscopy Indications:              Treatment of bleeding from polypectomy site Providers:                Aviva Signs, MD, Nelda Severe, RN, Bonnetta Barry,                            Technician Referring MD:              Medicines:                Sedation Administered by an Anesthesia Professional Complications:             Estimated Blood Loss:      Procedure:                Pre-Anesthesia Assessment:                           - Prior to the procedure, a History and Physical                            was performed, and patient medications and                            allergies were reviewed. The patient is competent.                            The risks and benefits of the procedure and the                            sedation options and risks were discussed with the                            patient. All questions were answered and informed                            consent was obtained. Patient identification and                            proposed procedure were verified by the physician                            and the nurse in the pre-procedure area in the                            procedure room. Mental Status Examination: alert                            and oriented. Airway Examination: normal                            oropharyngeal  airway and neck mobility. Respiratory                            Examination: clear to auscultation. CV Examination:                            RRR, no murmurs, no S3 or S4. Prophylactic                            Antibiotics: The patient does not require                            prophylactic antibiotics. Prior Anticoagulants: The                            patient has taken no previous anticoagulant  or                            antiplatelet agents. ASA Grade Assessment: III - A                            patient with severe systemic disease. After                            reviewing the risks and benefits, the patient was                            deemed in satisfactory condition to undergo the                            procedure. The anesthesia plan was to use deep                            sedation / analgesia. Immediately prior to                            administration of medications, the patient was                            re-assessed for adequacy to receive sedatives. The                            heart rate, respiratory rate, oxygen saturations,                            blood pressure, adequacy of pulmonary ventilation,                            and response to care were monitored throughout the                            procedure. The physical status of the patient was  re-assessed after the procedure.                           After obtaining informed consent, the scope was                            passed under direct vision. The [id number]                            scope was introduced through the anus and advanced                            to the the sigmoid colon. After obtaining informed                            consent, the scope was passed under direct vision.                            The flexible sigmoidoscopy was accomplished without                            difficulty. The patient tolerated the procedure                            well. The quality of the bowel preparation was                            adequate. Scope In: 2:09:42 PM Scope Out: 2:31:32 PM Total Procedure Duration: 0 hours 21 minutes 50 seconds  Findings:      Hematin (altered blood/coffee-ground-like material) was found in the mid       sigmoid colon. Estimated blood loss: none. For hemostasis, one       hemostatic clip was successfully  placed. There was no bleeding during,       or at the end, of the procedure. Estimated blood loss: none.      The exam was otherwise without abnormality. Impression:                Moderate Sedation:      Per Anesthesia Care Recommendation:            Procedure Code(s):        --- Professional ---                           [phone], Sigmoidoscopy, flexible; with control of                            bleeding, any method Diagnosis Code(s):        --- Professional ---                           [id number], Postprocedural hemorrhage of a digestive                            system organ or structure following a digestive  system procedure CPT copyright 2016 American Medical Association. All rights reserved. The codes documented in this report are preliminary and upon coder review may  be revised to meet current compliance requirements. Aviva Signs, MD Aviva Signs, MD 08/20/2016 3:39:53 PM This report has been signed electronically. Number of Addenda: 0

## 2016-08-20 NOTE — Anesthesia Postprocedure Evaluation (Signed)
Anesthesia Post Note  Patient: MACHEAL SKAINS  Procedure(s) Performed: Procedure(s) (LRB): FLEXIBLE SIGMOIDOSCOPY (N/A) ESOPHAGOGASTRODUODENOSCOPY (EGD) WITH PROPOFOL  Patient location during evaluation: PACU Anesthesia Type: MAC Level of consciousness: awake and alert, oriented and patient cooperative Pain management: pain level controlled Vital Signs Assessment: post-procedure vital signs reviewed and stable Respiratory status: spontaneous breathing, nonlabored ventilation and respiratory function stable Cardiovascular status: blood pressure returned to baseline and stable Postop Assessment: no signs of nausea or vomiting Anesthetic complications: no    Last Vitals:  Vitals:   08/20/16 1349 08/20/16 1523  BP: 128/80 (!) 144/72  Pulse: 72   Resp: 16   Temp: 36.6 C     Last Pain:  Vitals:   08/20/16 1523  TempSrc:   PainSc: 0-No pain                 Alahna Dunne J

## 2016-08-20 NOTE — Op Note (Signed)
[facility] Patient Name: Paul Arias Procedure Date: 08/20/2016 2:39 PM MRN: [id number] Date of Birth: 02/22/1966 Attending MD: Norvel Richards , MD CSN: [id number] Age: 50 Admit Type: Inpatient Procedure:                Upper GI endoscopy with bleeding control therapy. Indications:              Hematochezia; EGD in progress with Dr. Arnoldo Morale. I                            was asked to come in and assist with the procedure                            emergently. Providers:                Nelda Severe, RN, Bonnetta Barry, Technician, Norvel Richards, MD Referring MD:              Medicines:                Propofol per Anesthesia, Ondansetron 4 mg IV Complications:            No immediate complications. Estimated Blood Loss:     Estimated blood loss: none. Estimated blood loss:                            none. Procedure:                Pre-Anesthesia Assessment:                           - Prior to the procedure, a History and Physical                            was performed, and patient medications and                            allergies were reviewed. The patient is competent.                            The risks and benefits of the procedure and the                            sedation options and risks were discussed with the                            patient. All questions were answered and informed                            consent was obtained. Patient identification and                            proposed procedure were verified by the physician  and the nurse in the pre-procedure area in the                            procedure room. Mental Status Examination: alert                            and oriented. Airway Examination: normal                            oropharyngeal airway and neck mobility. Respiratory                            Examination: clear to auscultation. CV Examination:         RRR, no murmurs, no S3 or S4. Prophylactic                            Antibiotics: The patient does not require                            prophylactic antibiotics. Prior Anticoagulants: The                            patient has taken no previous anticoagulant or                            antiplatelet agents. ASA Grade Assessment: III - A                            patient with severe systemic disease. After                            reviewing the risks and benefits, the patient was                            deemed in satisfactory condition to undergo the                            procedure. The anesthesia plan was to use deep                            sedation / analgesia. Immediately prior to                            administration of medications, the patient was                            re-assessed for adequacy to receive sedatives. The                            heart rate, respiratory rate, oxygen saturations,                            blood pressure, adequacy of pulmonary ventilation,  and response to care were monitored throughout the                            procedure. The physical status of the patient was                            re-assessed after the procedure.                           After obtaining informed consent, the endoscope was                            passed under direct vision. Throughout the                            procedure, the patient's blood pressure, pulse, and                            oxygen saturations were monitored continuously. The                            [id number] scope was introduced through the                            mouth, and advanced to the second part of duodenum.                            The upper GI endoscopy was accomplished with ease.                            The patient tolerated the procedure well. Scope In: 2:41:08 PM Scope Out: 3:13:16 PM Total Procedure Duration: 0  hours 32 minutes 8 seconds  Findings:      The examined esophagus was normal.      A medium-sized hiatal hernia was present.      One non-bleeding cratered stellate shaped gastric ulcer with no stigmata       of bleeding was found on the greater curvature of the stomach. The       lesion was 8 mm in largest dimension.      One cratered ulcerated area with adherent clot in the second portion of       the duodenum. The lesion was 25 mm in largest dimension. 2 areas of       raised clot within the polypectomy crater consistent with visible vessel       stigmata.Marland Kitchen Heaped up margins consistent with the prior report of       incomplete removal.      duodenal ulcer crater injected at the periphery with a total of 10 mL of       100,000 epinephrine. Subsequently, the ulcer crater was closed with 4       Cook clips placed in "zipper" fashion. Good hemostasis maintained. Area       was successfully injected with 10 mL of a 1:10,000 solution of       epinephrine for hemostasis. There was no bleeding during, or at the end,  of the procedure. Impression:               - Normal esophagus.                           - Medium-sized hiatal hernia.                           - Non-bleeding gastric ulcer with no stigmata of                            bleeding.                           - One duodenal ulcer with adherent clot. Status                            post bleeding control therapy as described above                           - No specimens collected. Moderate Sedation:      Moderate (conscious) sedation was personally administered by an       anesthesia professional. The following parameters were monitored: oxygen       saturation, heart rate, blood pressure, respiratory rate, EKG, adequacy       of pulmonary ventilation, and response to care. Total physician       intraservice time was 38 minutes. Recommendation:           - Return patient to ICU for ongoing care. Avoid                             nonsteroidal agents in the future. Follow-up on                            pathology from debulking of duodenal lesion. No                            future MRI until clips colon. Patient will need a                            repeat EGD to be planned. Clear liquids when awake.                            Twice a day PPI therapy. See colonoscopy report                            (Dr. Arnoldo Morale).                           - Clear liquid diet today.                           - Continue present medications. Procedure Code(s):        --- Professional ---  43255, Esophagogastroduodenoscopy, flexible,                            transoral; with control of bleeding, any method Diagnosis Code(s):        --- Professional ---                           K44.9, Diaphragmatic hernia without obstruction or                            gangrene                           K25.9, Gastric ulcer, unspecified as acute or                            chronic, without hemorrhage or perforation                           K26.4, Chronic or unspecified duodenal ulcer with                            hemorrhage                           K92.1, Melena (includes Hematochezia) CPT copyright 2016 American Medical Association. All rights reserved. The codes documented in this report are preliminary and upon coder review may  be revised to meet current compliance requirements. [name]. Cassia Fein, MD Norvel Richards, MD 08/20/2016 4:14:37 PM This report has been signed electronically. Number of Addenda: 0

## 2016-08-20 NOTE — Progress Notes (Signed)
  Subjective: Patient states he feels fine. He did have another bloody bowel movement this morning. No hematemesis noted.  Objective: Vital signs in last 24 hours: Temp:  [97.9 F (36.6 C)-99.1 F (37.3 C)] 99.1 F (37.3 C) (09/13 0400) Pulse Rate:  [65-117] 80 (09/13 0500) Resp:  [10-40] 14 (09/13 0500) BP: (100-129)/(60-89) 104/67 (09/13 0500) SpO2:  [93 %-100 %] 93 % (09/13 0500) Weight:  [78.5 kg (173 lb 1 oz)-81.6 kg (180 lb)] 78.5 kg (173 lb 1 oz) (09/13 0500) Last BM Date: 08/19/16  Intake/Output from previous day: 09/12 0701 - 09/13 0700 In: 50 [IV Piggyback:50] Out: -  Intake/Output this shift: No intake/output data recorded.  General appearance: alert, cooperative and no distress Resp: clear to auscultation bilaterally Cardio: regular rate and rhythm, S1, S2 normal, no murmur, click, rub or gallop GI: soft, non-tender; bowel sounds normal; no masses,  no organomegaly  Lab Results:   Recent Labs  08/19/16 1738  08/19/16 2300 08/20/16 0520  WBC 11.8*  --   --  6.7  HGB 9.7*  < > 8.2* 7.7*  HCT 28.9*  < > 24.3* 22.5*  PLT 235  --   --  196  < > = values in this interval not displayed. BMET  Recent Labs  08/19/16 1738 08/19/16 1746 08/20/16 0520  NA 137 137 137  K 4.1 4.1 3.6  CL 103 101 107  CO2 24  --  23  GLUCOSE 207* 201* 105*  BUN 25* 24* 15  CREATININE 0.99 1.00 0.72  CALCIUM 9.1  --  8.3*   PT/INR  Recent Labs  08/19/16 1738  LABPROT 13.2  INR 1.00    Studies/Results: Dg Chest Portable 1 View  Result Date: 08/19/2016 CLINICAL DATA:  Post colonoscopy and EGD today. Rectal bleeding. Evaluate for free air. EXAM: PORTABLE CHEST 1 VIEW COMPARISON:  Chest radiograph 05/10/2010 FINDINGS: The cardiomediastinal contours are normal. Stable chronic hyperinflation. No pneumomediastinum. Pulmonary vasculature is normal. No consolidation, pleural effusion, or pneumothorax. No acute osseous abnormalities are seen. No evidence of free air under the  diaphragms. IMPRESSION: 1. No evidence of free air under the diaphragms. No pneumomediastinum. No acute chest findings. 2. Chronic hyperinflation. Electronically Signed   By: Jeb Levering M.D.   On: 08/19/2016 18:43   Dg Abd Portable 1v  Result Date: 08/19/2016 CLINICAL DATA:  Pain. Post EGD and colonoscopy today. Rectal bleeding. EXAM: PORTABLE ABDOMEN - 1 VIEW COMPARISON:  None. FINDINGS: Portable lateral decubitus view of the abdomen. No evidence of free air. Visualized bowel gas pattern is grossly normal. IMPRESSION: No evidence of free intra-abdominal air on lateral decubitus view. Electronically Signed   By: Jeb Levering M.D.   On: 08/19/2016 18:45    Anti-infectives: Anti-infectives    None      Assessment/Plan: Impression: Continued blood per rectum. Has had a drop in his hemoglobin status post colonoscopy with polypectomy, EGD with polypectomy. Plan: We'll take to endoscopy for colonoscopy, possible EGD today. Will received 2 units of packed red blood cells.  LOS: 0 days    Aleeha Boline A 08/20/2016

## 2016-08-20 NOTE — Transfer of Care (Signed)
Immediate Anesthesia Transfer of Care Note  Patient: Paul Arias  Procedure(s) Performed: Procedure(s): FLEXIBLE SIGMOIDOSCOPY (N/A) ESOPHAGOGASTRODUODENOSCOPY (EGD) WITH PROPOFOL  Patient Location: PACU  Anesthesia Type:MAC  Level of Consciousness: awake, oriented and patient cooperative  Airway & Oxygen Therapy: Patient Spontanous Breathing and Patient connected to nasal cannula oxygen  Post-op Assessment: Report given to RN, Post -op Vital signs reviewed and stable and Patient moving all extremities  Post vital signs: Reviewed and stable  Last Vitals:  Vitals:   08/20/16 1200 08/20/16 1349  BP:  128/80  Pulse:  72  Resp:  16  Temp: 37.1 C 36.6 C    Last Pain:  Vitals:   08/20/16 1349  TempSrc: Oral  PainSc:       Patients Stated Pain Goal: 0 ([id number])  Complications: No apparent anesthesia complications

## 2016-08-20 NOTE — Anesthesia Preprocedure Evaluation (Addendum)
Anesthesia Evaluation  Patient identified by MRN, date of birth, ID band Patient awake    Reviewed: Allergy & Precautions, NPO status , Patient's Chart, lab work & pertinent test results  Airway Mallampati: I  TM Distance: >3 FB     Dental  (+) Poor Dentition, Edentulous Upper   Pulmonary Current Smoker,    breath sounds clear to auscultation       Cardiovascular negative cardio ROS   Rhythm:Regular Rate:Normal     Neuro/Psych    GI/Hepatic GERD  ,(+)     substance abuse  alcohol use, Lower GI bleeding with anemia   Endo/Other    Renal/GU      Musculoskeletal   Abdominal   Peds  Hematology   Anesthesia Other Findings Hb= 7.7, getting 2 PRBC transfusion prior to procedure.  Reproductive/Obstetrics                            Anesthesia Physical Anesthesia Plan  ASA: III  Anesthesia Plan: MAC   Post-op Pain Management:    Induction: Intravenous  Airway Management Planned: Simple Face Mask  Additional Equipment:   Intra-op Plan:   Post-operative Plan:   Informed Consent: I have reviewed the patients History and Physical, chart, labs and discussed the procedure including the risks, benefits and alternatives for the proposed anesthesia with the patient or authorized representative who has indicated his/her understanding and acceptance.     Plan Discussed with:   Anesthesia Plan Comments:         Anesthesia Quick Evaluation

## 2016-08-21 ENCOUNTER — Telehealth: Payer: Self-pay | Admitting: Gastroenterology

## 2016-08-21 ENCOUNTER — Encounter: Payer: Self-pay | Admitting: Gastroenterology

## 2016-08-21 DIAGNOSIS — K264 Chronic or unspecified duodenal ulcer with hemorrhage: Secondary | ICD-10-CM

## 2016-08-21 DIAGNOSIS — D62 Acute posthemorrhagic anemia: Secondary | ICD-10-CM | POA: Diagnosis not present

## 2016-08-21 DIAGNOSIS — K259 Gastric ulcer, unspecified as acute or chronic, without hemorrhage or perforation: Secondary | ICD-10-CM

## 2016-08-21 LAB — TYPE AND SCREEN
ABO/RH(D): A NEG
Antibody Screen: NEGATIVE
UNIT DIVISION: 0
UNIT DIVISION: 0
Unit division: 0

## 2016-08-21 LAB — POCT I-STAT 4, (NA,K, GLUC, HGB,HCT)
GLUCOSE: 102 mg/dL — AB (ref 65–99)
HEMATOCRIT: 31 % — AB (ref 39.0–52.0)
HEMOGLOBIN: 10.5 g/dL — AB (ref 13.0–17.0)
Potassium: 3.6 mmol/L (ref 3.5–5.1)
Sodium: 142 mmol/L (ref 135–145)

## 2016-08-21 LAB — HEMOGLOBIN AND HEMATOCRIT, BLOOD
HEMATOCRIT: 26.6 % — AB (ref 39.0–52.0)
HEMOGLOBIN: 9.2 g/dL — AB (ref 13.0–17.0)

## 2016-08-21 LAB — CBC
HEMATOCRIT: 25.8 % — AB (ref 39.0–52.0)
HEMOGLOBIN: 8.8 g/dL — AB (ref 13.0–17.0)
MCH: 30.9 pg (ref 26.0–34.0)
MCHC: 34.1 g/dL (ref 30.0–36.0)
MCV: 90.5 fL (ref 78.0–100.0)
PLATELETS: 193 10*3/uL (ref 150–400)
RBC: 2.85 MIL/uL — AB (ref 4.22–5.81)
RDW: 15.2 % (ref 11.5–15.5)
WBC: 10.1 10*3/uL (ref 4.0–10.5)

## 2016-08-21 MED ORDER — OMEPRAZOLE 20 MG PO CPDR: 20.0000 mg | DELAYED_RELEASE_CAPSULE | Freq: Two times a day (BID) | ORAL | 3 refills | Status: DC

## 2016-08-21 MED ORDER — PANTOPRAZOLE SODIUM 40 MG PO TBEC: 40.0000 mg | DELAYED_RELEASE_TABLET | Freq: Two times a day (BID) | ORAL | Status: DC

## 2016-08-21 MED ORDER — FAMOTIDINE 20 MG PO TABS: 20.0000 mg | ORAL_TABLET | Freq: Two times a day (BID) | ORAL | Status: DC

## 2016-08-21 NOTE — Telephone Encounter (Signed)
Please schedule patient for hospital follow-up in 8-10 weeks. He will need to have the EGD in 12 weeks.

## 2016-08-21 NOTE — Care Management Note (Signed)
Case Management Note  Patient Details  Name: Paul Arias MRN: [id number] Date of Birth: 04-23-1966   Expected Discharge Date:     08/21/2016             Expected Discharge Plan:  Home/Self Care  In-House Referral:  NA  Discharge planning Services  CM Consult  Post Acute Care Choice:  NA Choice offered to:  NA  DME Arranged:    DME Agency:     HH Arranged:    [place] Agency:     Status of Service:  Completed, signed off  If discussed at [employer] of Stay Meetings, dates discussed:    Additional Comments: Anticipate DC home today. No CM needs. Sherald Barge, RN 08/21/2016, 10:07 AM

## 2016-08-21 NOTE — Progress Notes (Signed)
Subjective:  Doing better. Normal colored stools this morning. No further vomiting. No abdominal pain.   Objective: Vital signs in last 24 hours: Temp:  [97.5 F (36.4 C)-99.1 F (37.3 C)] 98.6 F (37 C) (09/14 0730) Pulse Rate:  [65-95] 65 (09/14 0700) Resp:  [14-16] 14 (09/14 0700) BP: (104-144)/(72-81) 104/74 (09/14 0700) SpO2:  [97 %-100 %] 98 % (09/14 0700) Weight:  [175 lb 7.8 oz (79.6 kg)] 175 lb 7.8 oz (79.6 kg) (09/14 0500) Last BM Date: 08/21/16 General:   Alert,  Well-developed, well-nourished, pleasant and cooperative in NAD Head:  Normocephalic and atraumatic. Eyes:  Sclera clear, no icterus.  Abdomen:  Soft, nontender and nondistended.  Normal bowel sounds, without guarding, and without rebound.   Extremities:  Without clubbing, deformity or edema. Neurologic:  Alert and  oriented x4;  grossly normal neurologically. Skin:  Intact without significant lesions or rashes. Psych:  Alert and cooperative. Normal mood and affect.  Intake/Output from previous day: 09/13 0701 - 09/14 0700 In: 2980 [I.V.:970; Blood:2010] Out: -  Intake/Output this shift: Total I/O In: 240 [P.O.:240] Out: -   Lab Results: CBC  Recent Labs  08/19/16 1738  08/20/16 0520 08/20/16 1343 08/21/16 0508  WBC 11.8*  --  6.7  --  10.1  HGB 9.7*  < > 7.7* 10.5* 8.8*  HCT 28.9*  < > 22.5* 31.0* 25.8*  MCV 93.2  --  91.8  --  90.5  PLT 235  --  196  --  193  < > = values in this interval not displayed. BMET  Recent Labs  08/19/16 1738 08/19/16 1746 08/20/16 0520 08/20/16 1343  NA 137 137 137 142  K 4.1 4.1 3.6 3.6  CL 103 101 107  --   CO2 24  --  23  --   GLUCOSE 207* 201* 105* 102*  BUN 25* 24* 15  --   CREATININE 0.99 1.00 0.72  --   CALCIUM 9.1  --  8.3*  --    LFTs  Recent Labs  08/19/16 1738  BILITOT 0.7  ALKPHOS 43  AST 33  ALT 23  PROT 6.1*  ALBUMIN 3.4*   No results for input(s): LIPASE in the last 72 hours. PT/INR  Recent Labs  08/19/16 1738  LABPROT  13.2  INR 1.00      Imaging Studies: Dg Chest Portable 1 View  Result Date: 08/19/2016 CLINICAL DATA:  Post colonoscopy and EGD today. Rectal bleeding. Evaluate for free air. EXAM: PORTABLE CHEST 1 VIEW COMPARISON:  Chest radiograph 05/10/2010 FINDINGS: The cardiomediastinal contours are normal. Stable chronic hyperinflation. No pneumomediastinum. Pulmonary vasculature is normal. No consolidation, pleural effusion, or pneumothorax. No acute osseous abnormalities are seen. No evidence of free air under the diaphragms. IMPRESSION: 1. No evidence of free air under the diaphragms. No pneumomediastinum. No acute chest findings. 2. Chronic hyperinflation. Electronically Signed   By: Jeb Levering M.D.   On: 08/19/2016 18:43   Dg Abd Portable 1v  Result Date: 08/19/2016 CLINICAL DATA:  Pain. Post EGD and colonoscopy today. Rectal bleeding. EXAM: PORTABLE ABDOMEN - 1 VIEW COMPARISON:  None. FINDINGS: Portable lateral decubitus view of the abdomen. No evidence of free air. Visualized bowel gas pattern is grossly normal. IMPRESSION: No evidence of free intra-abdominal air on lateral decubitus view. Electronically Signed   By: Jeb Levering M.D.   On: 08/19/2016 18:45  [2 weeks]   Assessment:  50 y/o male with h/o alcoholism who presented with acute onset UGI bleed s/p  EGD/TCS 08/19/16 for colon cancer screening and reflux disease/dysphagia by Dr. Arnoldo Morale. Patient noted to have villous appearing mucosa in the first portion of duodenum which was resected (pathology revealed tubular adenoma), also 2 polyps removed from the mid sigmoid again tubular adenomas. Patient developed blood in stool, vomiting without hematemesis, weakness. Found to have one ulcer with adherent clot requiring bleeding control therapy including four hemostasis clips by Dr. Gala Romney. Noted to have nonbleeding gastric ulcer as well (in the setting of BC powders). Patient has received 2 units of packed blood cells. Hemoglobin on  presentation 9.7. He is at 8.8 today without signs of ongoing overt GI bleeding.   Plan: 1. Patient will need repeat EGD in 12 weeks with Dr. Gala Romney, to follow-up gastric ulcer. Will reevaluate duodenum as well. 2. Recommend PPI twice a day. Was on omeprazole 20 mg daily at home. Would advise increasing to 20 mg twice a day. 3. Avoid aspirin powders, NSAIDs. Recommend reducing etoh consumption with goal of cessation.   Laureen Ochs. Bernarda Caffey [facility] [phone] 9/14/20171:50 PM     LOS: 0 days

## 2016-08-21 NOTE — Progress Notes (Signed)
Pt will be discharged home with family. Discharge instructions given and all questions answered.

## 2016-08-21 NOTE — Discharge Instructions (Signed)
Take prilosec one pill twice a day.  Avoid ibuprofen, bc powders.  Will refill prilosec prescription at you next office visit.

## 2016-08-21 NOTE — Telephone Encounter (Signed)
APPOINTMENT MADE AND LETTER SENT °

## 2016-08-21 NOTE — Progress Notes (Signed)
1 Day Post-Op  Subjective: Patient feels fine. He denies abdominal pain. His says his bowel movements have become clear.  Objective: Vital signs in last 24 hours: Temp:  [97 F (36.1 C)-99.1 F (37.3 C)] 98.6 F (37 C) (09/14 0730) Pulse Rate:  [65-95] 65 (09/14 0700) Resp:  [14-16] 14 (09/14 0700) BP: (104-144)/(72-88) 104/74 (09/14 0700) SpO2:  [97 %-100 %] 98 % (09/14 0700) Weight:  [79.6 kg (175 lb 7.8 oz)] 79.6 kg (175 lb 7.8 oz) (09/14 0500) Last BM Date: 08/21/16  Intake/Output from previous day: 09/13 0701 - 09/14 0700 In: 2980 [I.V.:970; Blood:2010] Out: -  Intake/Output this shift: No intake/output data recorded.  General appearance: alert, cooperative and no distress Resp: clear to auscultation bilaterally Cardio: regular rate and rhythm, S1, S2 normal, no murmur, click, rub or gallop GI: soft, non-tender; bowel sounds normal; no masses,  no organomegaly  Lab Results:   Recent Labs  08/20/16 0520 08/20/16 1343 08/21/16 0508  WBC 6.7  --  10.1  HGB 7.7* 10.5* 8.8*  HCT 22.5* 31.0* 25.8*  PLT 196  --  193   BMET  Recent Labs  08/19/16 1738 08/19/16 1746 08/20/16 0520 08/20/16 1343  NA 137 137 137 142  K 4.1 4.1 3.6 3.6  CL 103 101 107  --   CO2 24  --  23  --   GLUCOSE 207* 201* 105* 102*  BUN 25* 24* 15  --   CREATININE 0.99 1.00 0.72  --   CALCIUM 9.1  --  8.3*  --    PT/INR  Recent Labs  08/19/16 1738  LABPROT 13.2  INR 1.00    Studies/Results: Dg Chest Portable 1 View  Result Date: 08/19/2016 CLINICAL DATA:  Post colonoscopy and EGD today. Rectal bleeding. Evaluate for free air. EXAM: PORTABLE CHEST 1 VIEW COMPARISON:  Chest radiograph 05/10/2010 FINDINGS: The cardiomediastinal contours are normal. Stable chronic hyperinflation. No pneumomediastinum. Pulmonary vasculature is normal. No consolidation, pleural effusion, or pneumothorax. No acute osseous abnormalities are seen. No evidence of free air under the diaphragms. IMPRESSION: 1.  No evidence of free air under the diaphragms. No pneumomediastinum. No acute chest findings. 2. Chronic hyperinflation. Electronically Signed   By: Jeb Levering M.D.   On: 08/19/2016 18:43   Dg Abd Portable 1v  Result Date: 08/19/2016 CLINICAL DATA:  Pain. Post EGD and colonoscopy today. Rectal bleeding. EXAM: PORTABLE ABDOMEN - 1 VIEW COMPARISON:  None. FINDINGS: Portable lateral decubitus view of the abdomen. No evidence of free air. Visualized bowel gas pattern is grossly normal. IMPRESSION: No evidence of free intra-abdominal air on lateral decubitus view. Electronically Signed   By: Jeb Levering M.D.   On: 08/19/2016 18:45    Anti-infectives: Anti-infectives    None      Assessment/Plan: s/p Procedure(s): FLEXIBLE SIGMOIDOSCOPY ESOPHAGOGASTRODUODENOSCOPY (EGD) WITH PROPOFOL Impression: Patient is stable. Hemoglobin is down to 8.8, but this may be due to IV hydration. We'll advance to soft diet and check another hemoglobin. 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 Appreciate Dr. Roseanne Kaufman assistance. May discharged later on today if patient is stable.  LOS: 0 days    Paul Arias 08/21/2016

## 2016-08-22 NOTE — Discharge Summary (Signed)
Physician Discharge Summary  Patient ID: MAKBEL WHITTUM MRN: [id number] DOB/AGE: 1966-06-07 50 y.o.  Admit date: 08/19/2016 Discharge date: 08/21/2016 Admission Diagnoses: Blood per rectum, status post colonoscopy with polypectomy, EGD with polypectomy  Discharge Diagnoses: Same Active Problems:   Fresh blood passed per rectum   Acute blood loss anemia   Duodenal ulcer with hemorrhage   Gastric ulceration   Discharged Condition: good  Hospital Course: Patient is a 50 year old white male who underwent both a colonoscopy with polypectomy and EGD with polypectomy of an adenomatous polyp in the duodenum on 08/19/16 who presented that evening with worsening episodes of blood per rectum and nausea. He did have several episodes of emesis while at home, which were reportedly negative for blood. He presented to the emergency room and needed an IV fluid bolus for relative hypotension. Chest x-ray and abdominal films were negative for pneumoperitoneum. The patient was admitted to the stepdown unit for observation. His hemoglobin did drop, requiring 2 units packed red blood cells. He was taken back to the endoscopy suite and underwent a flexible sigmoidoscopy with clipping of a lumpectomy site in the sigmoid colon as well as an EGD with clipping of the duodenal polypectomy site by Dr. Gala Romney. 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 The patient was monitored after the procedures and the following morning, his hemoglobin remained stable. His diet was advanced without difficulty and he was discharged home on 08/21/2016 in good and stable condition.  Consults: GI  Treatments: surgery: Colonoscopy with control of post polypectomy bleed by Dr. Aviva Signs, EGD with control of post polypectomy bleed by Dr. Gala Romney on 08/20/2016.  Discharge Exam: Blood pressure 104/74, pulse 65, temperature 98.6 F (37 C), temperature source Oral, resp. rate 14, height 6\' 3"  (1.905 m), weight 79.6 kg (175 lb  7.8 oz), SpO2 98 %. General appearance: alert, cooperative and no distress Resp: clear to auscultation bilaterally Cardio: regular rate and rhythm, S1, S2 normal, no murmur, click, rub or gallop GI: soft, non-tender; bowel sounds normal; no masses,  no organomegaly  Disposition: 01-Home or Self Care     Medication List    STOP taking these medications   aspirin EC 81 MG tablet   vitamin C 500 MG tablet Commonly known as:  ASCORBIC ACID     TAKE these medications   ALPRAZolam 1 MG tablet Commonly known as:  XANAX Take 1 mg by mouth at bedtime.   atorvastatin 40 MG tablet Commonly known as:  LIPITOR Take 40 mg by mouth daily at 6 PM.   fenofibrate 145 MG tablet Commonly known as:  TRICOR Take 145 mg by mouth daily.   HYDROcodone-acetaminophen 10-325 MG tablet Commonly known as:  NORCO Take 1 tablet by mouth every 6 (six) hours as needed.   omeprazole 20 MG capsule Commonly known as:  PRILOSEC Take 1 capsule (20 mg total) by mouth 2 (two) times daily before a meal. What changed:  when to take this      Follow-up Information    Jamesetta So, MD. Schedule an appointment as soon as possible for a visit on 09/04/2016.   Specialty:  General Surgery Contact information: [id number] [place] [id number] [phone]           Signed: Aviva Signs A 08/22/2016, 8:07 AM

## 2016-08-25 ENCOUNTER — Encounter (HOSPITAL_COMMUNITY): Payer: Self-pay | Admitting: General Surgery

## 2016-09-03 ENCOUNTER — Encounter (HOSPITAL_COMMUNITY): Payer: Self-pay | Admitting: General Surgery

## 2016-10-17 ENCOUNTER — Ambulatory Visit (INDEPENDENT_AMBULATORY_CARE_PROVIDER_SITE_OTHER): Payer: 59 | Admitting: Gastroenterology

## 2016-10-17 ENCOUNTER — Encounter: Payer: Self-pay | Admitting: Gastroenterology

## 2016-10-17 ENCOUNTER — Telehealth: Payer: Self-pay

## 2016-10-17 ENCOUNTER — Other Ambulatory Visit: Payer: Self-pay

## 2016-10-17 VITALS — BP 146/81 | HR 81 | Temp 97.6°F | Ht 75.0 in | Wt 178.2 lb

## 2016-10-17 DIAGNOSIS — K259 Gastric ulcer, unspecified as acute or chronic, without hemorrhage or perforation: Secondary | ICD-10-CM | POA: Diagnosis not present

## 2016-10-17 DIAGNOSIS — K264 Chronic or unspecified duodenal ulcer with hemorrhage: Secondary | ICD-10-CM

## 2016-10-17 NOTE — Assessment & Plan Note (Signed)
Abnormal duodenal mucosa status post resection, tubular adenoma. Resultant post polypectomy GI bleeding as outlined. Due for repeat EGD. Area will be reassessed. Plan for deep sedation in the OR. I have discussed the risks, alternatives, benefits with regards to but not limited to the risk of reaction to medication, bleeding, infection, perforation and the patient is agreeable to proceed. Written consent to be obtained.

## 2016-10-17 NOTE — Assessment & Plan Note (Signed)
Nonbleeding gastric ulcer likely related to aspirin use. Longer using aspirin powders. Has been on PPI twice a day since his procedures. Clinically doing well. Due for surveillance EGD at this time. Plan for deep sedation in the OR.  I have discussed the risks, alternatives, benefits with regards to but not limited to the risk of reaction to medication, bleeding, infection, perforation and the patient is agreeable to proceed. Written consent to be obtained.  Urged the patient to continued cutting back on alcohol use. Family history of cirrhosis. Discussed that even though his LFTs are normal, that does not rule out developing underlying liver disease. Patient is also concerned about family history of esophageal cancer, brother. His risk of cancer is increased given ongoing alcohol abuse. Voiced understanding.

## 2016-10-17 NOTE — Telephone Encounter (Addendum)
Pt's sister called and she is wanting to make sure that when they do his EGD that they look at the duodenal area because she said that Dr.Jenkins told them that the wall was to thin to removal it. They had a brother pass away with esophageal cancer and she is worried. Her call back number is [phone]

## 2016-10-17 NOTE — Telephone Encounter (Signed)
LMOM and informed of pre-op appt 10/21/16 at 2:45pm. Pt then called office and requested to change date of pre-op appt. Pre-op appt changed to 10/24/16 at 1:15pm. Attempted to call pt back to inform of new appt, no answer.

## 2016-10-17 NOTE — Patient Instructions (Signed)
1. Continue to avoid aspirin powders. 2. Continue to cut back on alcohol consumption. Congratulations on your success so far.  3. Upper endoscopy with Dr. Gala Romney as scheduled. See separate instructions.

## 2016-10-17 NOTE — Telephone Encounter (Signed)
Pt came in office. Informed of new pre-op appt 10/24/16 and letter given.

## 2016-10-17 NOTE — Progress Notes (Signed)
Primary Care Physician:  Robert Bellow, MD  Primary Gastroenterologist:  Garfield Cornea, MD   Chief Complaint  Patient presents with  . Follow-up    was in hospital, throat sore sometimes in the morning. ?EGD set-up    HPI:  Paul Arias is a 50 y.o. male here with history of alcohol abuse who we saw during admission on 08/20/16 for acute onset upper GI bleed status post EGD/colonoscopy on 08/19/2016 for colon cancer screening and reflux disease/dysphagia by Dr. Arnoldo Morale. Patient was noted to have villous appearing mucosa in the first portion of the duodenum which was resected (pathology revealed tubular adenoma), also 2 polyps removed from the mid sigmoid colon which were tubular adenomas. Patient developed acute onset blood in the stool, vomiting without hematemesis, weakness. Dr. Arnoldo Morale initially perform flexible sigmoidoscopy to identify blood source, per his note hemostasis clip was placed in the sigmoid colon presumably at site of prior polypectomy. Repeat upper endoscopy was undertaken and Dr. Gala Romney was asked to assist emergently. He was found to have nonbleeding gastric ulcer. In the duodenum there was a cratered ulcerated area with adherent clot in the second portion measuring 25 mm, 2 areas of raised clot within the polypectomy crater consistent with visible vessel stigmata. Heat up margins consistent with prior report of incomplete removal. Patient had bleeding control therapy to include for hemostasis clips. This resulted in no further GI bleeding.Patient Hemoglobin at time of discharge 9.2. Received 2 units of prbcs during admission.   Patient presents today to schedule his 3 month follow-up EGD. He needs to have EGD to follow-up on gastric ulcer, likely related to aspirin powder use as well as duodenal area. Clinically he states he's doing well. He's had no further signs of bleeding. No melena rectal bleeding. Denies any bowel concerns. No abdominal pain. Denies any heartburn,  dysphagia. Notes that he is try to cut back on alcohol consumption. He quit liquor. He is down to 24 ounces of beer daily.  Current Outpatient Prescriptions  Medication Sig Dispense Refill  . ALPRAZolam (XANAX) 1 MG tablet Take 1 mg by mouth at bedtime.     Marland Kitchen atorvastatin (LIPITOR) 40 MG tablet Take 40 mg by mouth daily at 6 PM.     . fenofibrate (TRICOR) 145 MG tablet Take 145 mg by mouth daily.     Marland Kitchen HYDROcodone-acetaminophen (NORCO) 10-325 MG tablet Take 1 tablet by mouth every 6 (six) hours as needed.     Marland Kitchen omeprazole (PRILOSEC) 20 MG capsule Take 1 capsule (20 mg total) by mouth 2 (two) times daily before a meal. 60 capsule 3  . vitamin B-12 (CYANOCOBALAMIN) 1000 MCG tablet Take 1,000 mcg by mouth daily.     No current facility-administered medications for this visit.     Allergies as of 10/17/2016  . (No Known Allergies)    Past Medical History:  Diagnosis Date  . GERD (gastroesophageal reflux disease)   . Hypercholesteremia   . Hyperlipidemia     Past Surgical History:  Procedure Laterality Date  . COLONOSCOPY N/A 08/19/2016   Dr. Aviva Signs: 2 semi-pedunculated polyps in the distal sigmoid colon removed. Measure 2-5 mm. Tubular adenomas.  . ESOPHAGOGASTRODUODENOSCOPY N/A 08/19/2016   Dr. Arnoldo Morale: Villous appearing mucosa in the first portion of the duodenum, polyp removed with hot snare, pathology tubular adenoma. Medium sized hiatal hernia. Biopsy for CLOtest which was negative.  . ESOPHAGOGASTRODUODENOSCOPY (EGD) WITH PROPOFOL  08/20/2016   Dr. Gala Romney: assisted Dr. Arnoldo Morale emergently. Esophagus normal. Medium sized  hiatal hernia. One nonbleeding cratered stellate-shaped gastric ulcer measuring 8 mm. One cratered ulcerated area with adherent clot in the second portion of the duodenum. 25 mm in diameter. Visible vessel seen. Status post bleeding control therapy. Heaped up margins consistent with incomplete removal.   . FLEXIBLE SIGMOIDOSCOPY N/A 08/20/2016   Dr. Arnoldo Morale:  Hematin found in mid sigmoid colon. One hemostatic clip placed.  . NO PAST SURGERIES      Family History  Problem Relation Age of Onset  . Cirrhosis Mother   . Heart disease Father   . Cancer Brother     esophageal cancer    Social History   Social History  . Marital status: Single    Spouse name: N/A  . Number of children: N/A  . Years of education: N/A   Occupational History  . Not on file.   Social History Main Topics  . Smoking status: Current Every Day Smoker    Packs/day: 0.25    Years: 40.00    Types: Cigarettes  . Smokeless tobacco: Never Used  . Alcohol use 10.8 oz/week    14 Cans of beer, 4 Shots of liquor per week     Comment: as of 10/17/16 patient reports cut back to 24 ounces of beer daily and no liquor  . Drug use: No  . Sexual activity: Not on file   Other Topics Concern  . Not on file   Social History Narrative  . No narrative on file      ROS:  General: Negative for anorexia, weight loss, fever, chills, fatigue, weakness. Eyes: Negative for vision changes.  ENT: Negative for hoarseness, difficulty swallowing , nasal congestion. CV: Negative for chest pain, angina, palpitations, dyspnea on exertion, peripheral edema.  Respiratory: Negative for dyspnea at rest, dyspnea on exertion, cough, sputum, wheezing.  GI: See history of present illness. GU:  Negative for dysuria, hematuria, urinary incontinence, urinary frequency, nocturnal urination.  MS: Negative for joint pain,Positive Chronic low back pain.  Derm: Negative for rash or itching.  Neuro: Negative for weakness, abnormal sensation, seizure, frequent headaches, memory loss, confusion.  Psych: Negative for anxiety, depression, suicidal ideation, hallucinations.  Endo: Negative for unusual weight change.  Heme: Negative for bruising or bleeding. Allergy: Negative for rash or hives.    Physical Examination:  BP (!) 146/81   Pulse 81   Temp 97.6 F (36.4 C) (Oral)   Ht 6\' 3"  (1.905 m)    Wt 178 lb 3.2 oz (80.8 kg)   BMI 22.27 kg/m    General: Well-nourished, well-developed in no acute distress.  Head: Normocephalic, atraumatic.   Eyes: Conjunctiva pink, no icterus. Mouth: Oropharyngeal mucosa moist and pink , no lesions erythema or exudate. Neck: Supple without thyromegaly, masses, or lymphadenopathy.  Lungs: Clear to auscultation bilaterally.  Heart: Regular rate and rhythm, no murmurs rubs or gallops.  Abdomen: Bowel sounds are normal, nontender, nondistended, no hepatosplenomegaly or masses, no abdominal bruits or    hernia , no rebound or guarding.   Rectal: Not performed Extremities: No lower extremity edema. No clubbing or deformities.  Neuro: Alert and oriented x 4 , grossly normal neurologically.  Skin: Warm and dry, no rash or jaundice.   Psych: Alert and cooperative, normal mood and affect.  Labs: Lab Results  Component Value Date   WBC 10.1 08/21/2016   HGB 9.2 (L) 08/21/2016   HCT 26.6 (L) 08/21/2016   MCV 90.5 08/21/2016   PLT 193 08/21/2016   Lab Results  Component Value  Date   CREATININE 0.72 08/20/2016   BUN 15 08/20/2016   NA 142 08/20/2016   K 3.6 08/20/2016   CL 107 08/20/2016   CO2 23 08/20/2016   Lab Results  Component Value Date   ALT 23 08/19/2016   AST 33 08/19/2016   ALKPHOS 43 08/19/2016   BILITOT 0.7 08/19/2016     Imaging Studies: No results found.

## 2016-10-17 NOTE — Telephone Encounter (Signed)
Please let sister know (assuming she is on the release of medical info sheet), that we will be looking at the duodenal area, entire stomach and esophagus.   Patient does not have esophageal cancer, his esophagus was normal.

## 2016-10-20 NOTE — Telephone Encounter (Signed)
Pt's sister is aware

## 2016-10-21 ENCOUNTER — Ambulatory Visit: Payer: BLUE CROSS/BLUE SHIELD | Admitting: Gastroenterology

## 2016-10-21 ENCOUNTER — Inpatient Hospital Stay (HOSPITAL_COMMUNITY): Admission: RE | Admit: 2016-10-21 | Payer: 59 | Source: Ambulatory Visit

## 2016-10-21 NOTE — Progress Notes (Signed)
CC'D TO PCP °

## 2016-10-23 NOTE — Patient Instructions (Signed)
Paul Arias  10/23/2016     @PREFPERIOPPHARMACY @   Your procedure is scheduled on 10/27/2016.  Report to [facility] at 8:45 A.M.  Call this number if you have problems the morning of surgery:  [phone]   Remember:  Do not eat food or drink liquids after midnight.  Take these medicines the morning of surgery with A SIP OF WATER : XANAX, [place]   Do not wear jewelry, make-up or nail polish.  Do not wear lotions, powders, or perfumes, or deoderant.  Do not shave 48 hours prior to surgery.  Men may shave face and neck.  Do not bring valuables to the hospital.  [facility] is not responsible for any belongings or valuables.  Contacts, dentures or bridgework may not be worn into surgery.  Leave your suitcase in the car.  After surgery it may be brought to your room.  For patients admitted to the hospital, discharge time will be determined by your treatment team.  Patients discharged the day of surgery will not be allowed to drive home.   Name and phone number of your driver:   FAMILY Special instructions:  N/A  Please read over the following fact sheets that you were given. Care and Recovery After Surgery  Esophagogastroduodenoscopy Introduction Esophagogastroduodenoscopy (EGD) is a procedure to examine the lining of the esophagus, stomach, and first part of the small intestine (duodenum). This procedure is done to check for problems such as inflammation, bleeding, ulcers, or growths. During this procedure, a long, flexible, lighted tube with a camera attached (endoscope) is inserted down the throat. Tell a health care provider about:  Any allergies you have.  All medicines you are taking, including vitamins, herbs, eye drops, creams, and over-the-counter medicines.  Any problems you or family members have had with anesthetic medicines.  Any blood disorders you have.  Any surgeries you have had.  Any medical conditions you have.  Whether you are  pregnant or may be pregnant. What are the risks? Generally, this is a safe procedure. However, problems may occur, including:  Infection.  Bleeding.  A tear (perforation) in the esophagus, stomach, or duodenum.  Trouble breathing.  Excessive sweating.  Spasms of the larynx.  A slowed heartbeat.  Low blood pressure. What happens before the procedure?  Follow instructions from your health care provider about eating or drinking restrictions.  Ask your health care provider about:  Changing or stopping your regular medicines. This is especially important if you are taking diabetes medicines or blood thinners.  Taking medicines such as aspirin and ibuprofen. These medicines can thin your blood. Do not take these medicines before your procedure if your health care provider instructs you not to.  Plan to have someone take you home after the procedure.  If you wear dentures, be ready to remove them before the procedure. What happens during the procedure?  To reduce your risk of infection, your health care team will wash or sanitize their hands.  An IV tube will be put in a vein in your hand or arm. You will get medicines and fluids through this tube.  You will be given one or more of the following:  A medicine to help you relax (sedative).  A medicine to numb the area (local anesthetic). This medicine may be sprayed into your throat. It will make you feel more comfortable and keep you from gagging or coughing during the procedure.  A medicine for pain.  A mouth guard may be  placed in your mouth to protect your teeth and to keep you from biting on the endoscope.  You will be asked to lie on your left side.  The endoscope will be lowered down your throat into your esophagus, stomach, and duodenum.  Air will be put into the endoscope. This will help your health care provider see better.  The lining of your esophagus, stomach, and duodenum will be examined.  Your health  care provider may:  Take a tissue sample so it can be looked at in a lab (biopsy).  Remove growths.  Remove objects (foreign bodies) that are stuck.  Treat any bleeding with medicines or other devices that stop tissue from bleeding.  Widen (dilate) or stretch narrowed areas of your esophagus and stomach.  The endoscope will be taken out. The procedure may vary among health care providers and hospitals. What happens after the procedure?  Your blood pressure, heart rate, breathing rate, and blood oxygen level will be monitored often until the medicines you were given have worn off.  Do not eat or drink anything until the numbing medicine has worn off and your gag reflex has returned. This information is not intended to replace advice given to you by your health care provider. Make sure you discuss any questions you have with your health care provider. Document Released: 03/27/2005 Document Revised: 05/01/2016 Document Reviewed: 10/18/2015  2017 Elsevier  Colonoscopy, Adult A colonoscopy is an exam to look at the entire large intestine. During the exam, a lubricated, bendable tube is inserted into the anus and then passed into the rectum, colon, and other parts of the large intestine. A colonoscopy is often done as a part of normal colorectal screening or in response to certain symptoms, such as anemia, persistent diarrhea, abdominal pain, and blood in the stool. The exam can help screen for and diagnose medical problems, including:  Tumors.  Polyps.  Inflammation.  Areas of bleeding. Tell a health care provider about:  Any allergies you have.  All medicines you are taking, including vitamins, herbs, eye drops, creams, and over-the-counter medicines.  Any problems you or family members have had with anesthetic medicines.  Any blood disorders you have.  Any surgeries you have had.  Any medical conditions you have.  Any problems you have had passing stool. What are the  risks? Generally, this is a safe procedure. However, problems may occur, including:  Bleeding.  A tear in the intestine.  A reaction to medicines given during the exam.  Infection (rare). What happens before the procedure? Eating and drinking restrictions  Follow instructions from your health care provider about eating and drinking, which may include:  A few days before the procedure - follow a low-fiber diet. Avoid nuts, seeds, dried fruit, raw fruits, and vegetables.  1-3 days before the procedure - follow a clear liquid diet. Drink only clear liquids, such as clear broth or bouillon, black coffee or tea, clear juice, clear soft drinks or sports drinks, gelatin desert, and popsicles. Avoid any liquids that contain red or purple dye.  On the day of the procedure - do not eat or drink anything during the 2 hours before the procedure, or within the time period that your health care provider recommends. Bowel prep  If you were prescribed an oral bowel prep to clean out your colon:  Take it as told by your health care provider. Starting the day before your procedure, you will need to drink a large amount of medicated liquid. The liquid  will cause you to have multiple loose stools until your stool is almost clear or light green.  If your skin or anus gets irritated from diarrhea, you may use these to relieve the irritation:  Medicated wipes, such as adult wet wipes with aloe and vitamin E.  A skin soothing-product like petroleum jelly.  If you vomit while drinking the bowel prep, take a break for up to 60 minutes and then begin the bowel prep again. If vomiting continues and you cannot take the bowel prep without vomiting, call your health care provider. General instructions  Ask your health care provider about changing or stopping your regular medicines. This is especially important if you are taking diabetes medicines or blood thinners.  Plan to have someone take you home from the  hospital or clinic. What happens during the procedure?  An IV tube may be inserted into one of your veins.  You will be given medicine to help you relax (sedative).  To reduce your risk of infection:  Your health care team will wash or sanitize their hands.  Your anal area will be washed with soap.  You will be asked to lie on your side with your knees bent.  Your health care provider will lubricate a long, thin, flexible tube. The tube will have a camera and a light on the end.  The tube will be inserted into your anus.  The tube will be gently eased through your rectum and colon.  Air will be delivered into your colon to keep it open. You may feel some pressure or cramping.  The camera will be used to take images during the procedure.  A small tissue sample may be removed from your body to be examined under a microscope (biopsy). If any potential problems are found, the tissue will be sent to a lab for testing.  If small polyps are found, your health care provider may remove them and have them checked for cancer cells.  The tube that was inserted into your anus will be slowly removed. The procedure may vary among health care providers and hospitals. What happens after the procedure?  Your blood pressure, heart rate, breathing rate, and blood oxygen level will be monitored until the medicines you were given have worn off.  Do not drive for 24 hours after the exam.  You may have a small amount of blood in your stool.  You may pass gas and have mild abdominal cramping or bloating due to the air that was used to inflate your colon during the exam.  It is up to you to get the results of your procedure. Ask your health care provider, or the department performing the procedure, when your results will be ready. This information is not intended to replace advice given to you by your health care provider. Make sure you discuss any questions you have with your health care  provider. Document Released: 11/21/2000 Document Revised: 06/13/2016 Document Reviewed: 02/05/2016 Elsevier Interactive Patient Education  2017 [employer].

## 2016-10-24 ENCOUNTER — Encounter (HOSPITAL_COMMUNITY): Payer: Self-pay

## 2016-10-24 ENCOUNTER — Encounter (HOSPITAL_COMMUNITY)
Admission: RE | Admit: 2016-10-24 | Discharge: 2016-10-24 | Disposition: A | Discharge status: Home / Self Care | Payer: 59 | Source: Ambulatory Visit | Attending: Internal Medicine | Admitting: Internal Medicine

## 2016-10-24 DIAGNOSIS — K219 Gastro-esophageal reflux disease without esophagitis: Secondary | ICD-10-CM | POA: Diagnosis not present

## 2016-10-24 DIAGNOSIS — K317 Polyp of stomach and duodenum: Secondary | ICD-10-CM | POA: Diagnosis present

## 2016-10-24 DIAGNOSIS — E7800 Pure hypercholesterolemia, unspecified: Secondary | ICD-10-CM | POA: Diagnosis not present

## 2016-10-24 DIAGNOSIS — F17210 Nicotine dependence, cigarettes, uncomplicated: Secondary | ICD-10-CM | POA: Diagnosis not present

## 2016-10-24 DIAGNOSIS — D132 Benign neoplasm of duodenum: Secondary | ICD-10-CM | POA: Diagnosis not present

## 2016-10-24 DIAGNOSIS — R1310 Dysphagia, unspecified: Secondary | ICD-10-CM | POA: Insufficient documentation

## 2016-10-24 DIAGNOSIS — Z01812 Encounter for preprocedural laboratory examination: Secondary | ICD-10-CM | POA: Insufficient documentation

## 2016-10-24 DIAGNOSIS — Z01818 Encounter for other preprocedural examination: Secondary | ICD-10-CM

## 2016-10-24 DIAGNOSIS — J449 Chronic obstructive pulmonary disease, unspecified: Secondary | ICD-10-CM | POA: Diagnosis not present

## 2016-10-24 DIAGNOSIS — K2270 Barrett's esophagus without dysplasia: Secondary | ICD-10-CM | POA: Diagnosis not present

## 2016-10-24 LAB — BASIC METABOLIC PANEL
ANION GAP: 7 (ref 5–15)
BUN: 14 mg/dL (ref 6–20)
CALCIUM: 9.8 mg/dL (ref 8.9–10.3)
CO2: 26 mmol/L (ref 22–32)
Chloride: 105 mmol/L (ref 101–111)
Creatinine, Ser: 0.91 mg/dL (ref 0.61–1.24)
GLUCOSE: 114 mg/dL — AB (ref 65–99)
Potassium: 3.9 mmol/L (ref 3.5–5.1)
SODIUM: 138 mmol/L (ref 135–145)

## 2016-10-24 LAB — CBC WITH DIFFERENTIAL/PLATELET
BASOS ABS: 0 10*3/uL (ref 0.0–0.1)
BASOS PCT: 0 %
EOS ABS: 0.1 10*3/uL (ref 0.0–0.7)
EOS PCT: 1 %
HCT: 38.7 % — ABNORMAL LOW (ref 39.0–52.0)
Hemoglobin: 12.5 g/dL — ABNORMAL LOW (ref 13.0–17.0)
Lymphocytes Relative: 24 %
Lymphs Abs: 1.6 10*3/uL (ref 0.7–4.0)
MCH: 28.4 pg (ref 26.0–34.0)
MCHC: 32.3 g/dL (ref 30.0–36.0)
MCV: 88 fL (ref 78.0–100.0)
MONO ABS: 0.8 10*3/uL (ref 0.1–1.0)
Monocytes Relative: 12 %
Neutro Abs: 4.3 10*3/uL (ref 1.7–7.7)
Neutrophils Relative %: 63 %
PLATELETS: 289 10*3/uL (ref 150–400)
RBC: 4.4 MIL/uL (ref 4.22–5.81)
RDW: 13.4 % (ref 11.5–15.5)
WBC: 6.8 10*3/uL (ref 4.0–10.5)

## 2016-10-24 MED ORDER — SODIUM CHLORIDE 0.9 % IV SOLN: INTRAVENOUS | Status: DC

## 2016-10-27 ENCOUNTER — Encounter (HOSPITAL_COMMUNITY): Payer: Self-pay

## 2016-10-27 ENCOUNTER — Ambulatory Visit (HOSPITAL_COMMUNITY): Payer: 59 | Admitting: Anesthesiology

## 2016-10-27 ENCOUNTER — Ambulatory Visit (HOSPITAL_COMMUNITY)
Admission: RE | Admit: 2016-10-27 | Discharge: 2016-10-27 | Disposition: A | Discharge status: Home / Self Care | Payer: 59 | Source: Ambulatory Visit | Attending: Internal Medicine | Admitting: Internal Medicine

## 2016-10-27 ENCOUNTER — Encounter (HOSPITAL_COMMUNITY)
Admission: RE | Disposition: A | Discharge status: Home / Self Care | Payer: Self-pay | Source: Ambulatory Visit | Attending: Internal Medicine

## 2016-10-27 DIAGNOSIS — J449 Chronic obstructive pulmonary disease, unspecified: Secondary | ICD-10-CM | POA: Insufficient documentation

## 2016-10-27 DIAGNOSIS — F17210 Nicotine dependence, cigarettes, uncomplicated: Secondary | ICD-10-CM | POA: Insufficient documentation

## 2016-10-27 DIAGNOSIS — K219 Gastro-esophageal reflux disease without esophagitis: Secondary | ICD-10-CM | POA: Insufficient documentation

## 2016-10-27 DIAGNOSIS — K2270 Barrett's esophagus without dysplasia: Secondary | ICD-10-CM | POA: Diagnosis not present

## 2016-10-27 DIAGNOSIS — K259 Gastric ulcer, unspecified as acute or chronic, without hemorrhage or perforation: Secondary | ICD-10-CM

## 2016-10-27 DIAGNOSIS — D132 Benign neoplasm of duodenum: Secondary | ICD-10-CM | POA: Insufficient documentation

## 2016-10-27 DIAGNOSIS — K317 Polyp of stomach and duodenum: Secondary | ICD-10-CM | POA: Diagnosis not present

## 2016-10-27 DIAGNOSIS — E7800 Pure hypercholesterolemia, unspecified: Secondary | ICD-10-CM | POA: Insufficient documentation

## 2016-10-27 HISTORY — DX: Chronic obstructive pulmonary disease, unspecified: J44.9

## 2016-10-27 HISTORY — PX: ESOPHAGOGASTRODUODENOSCOPY (EGD) WITH PROPOFOL: SHX5813

## 2016-10-27 SURGERY — ESOPHAGOGASTRODUODENOSCOPY (EGD) WITH PROPOFOL
Anesthesia: Monitor Anesthesia Care

## 2016-10-27 MED ORDER — MIDAZOLAM HCL 2 MG/2ML IJ SOLN
INTRAMUSCULAR | Status: AC
  Filled 2016-10-27: qty 2

## 2016-10-27 MED ORDER — CHLORHEXIDINE GLUCONATE CLOTH 2 % EX PADS: 6.0000 | MEDICATED_PAD | Freq: Once | CUTANEOUS | Status: DC

## 2016-10-27 MED ORDER — LIDOCAINE VISCOUS 2 % MT SOLN: 5.0000 mL | Freq: Once | OROMUCOSAL | Status: DC

## 2016-10-27 MED ORDER — MIDAZOLAM HCL 5 MG/5ML IJ SOLN
INTRAMUSCULAR | Status: DC | PRN
  Administered 2016-10-27: 2 mg via INTRAVENOUS

## 2016-10-27 MED ORDER — LACTATED RINGERS IV SOLN
INTRAVENOUS | Status: DC
  Administered 2016-10-27: 08:00:00 via INTRAVENOUS

## 2016-10-27 MED ORDER — MIDAZOLAM HCL 2 MG/2ML IJ SOLN
1.0000 mg | INTRAMUSCULAR | Status: DC | PRN
  Administered 2016-10-27 (×2): 2 mg via INTRAVENOUS
  Filled 2016-10-27: qty 2

## 2016-10-27 MED ORDER — PROPOFOL 10 MG/ML IV BOLUS
INTRAVENOUS | Status: AC
  Filled 2016-10-27: qty 40

## 2016-10-27 MED ORDER — PROPOFOL 500 MG/50ML IV EMUL
INTRAVENOUS | Status: DC | PRN
  Administered 2016-10-27: 11:00:00 via INTRAVENOUS
  Administered 2016-10-27: 100 ug/kg/min via INTRAVENOUS

## 2016-10-27 MED ORDER — FENTANYL CITRATE (PF) 100 MCG/2ML IJ SOLN
25.0000 ug | INTRAMUSCULAR | Status: AC | PRN
  Administered 2016-10-27 (×2): 25 ug via INTRAVENOUS

## 2016-10-27 MED ORDER — LIDOCAINE VISCOUS 2 % MT SOLN
OROMUCOSAL | Status: AC
  Filled 2016-10-27: qty 15

## 2016-10-27 MED ORDER — FENTANYL CITRATE (PF) 100 MCG/2ML IJ SOLN
INTRAMUSCULAR | Status: AC
  Filled 2016-10-27: qty 2

## 2016-10-27 NOTE — Transfer of Care (Signed)
Immediate Anesthesia Transfer of Care Note  Patient: Paul Arias  Procedure(s) Performed: Procedure(s) with comments: ESOPHAGOGASTRODUODENOSCOPY (EGD) WITH PROPOFOL (N/A) - 1015  Patient Location: PACU  Anesthesia Type:MAC  Level of Consciousness: awake and patient cooperative  Airway & Oxygen Therapy: Patient Spontanous Breathing and Patient connected to face mask oxygen  Post-op Assessment: Report given to RN, Post -op Vital signs reviewed and stable and Patient moving all extremities  Post vital signs: Reviewed and stable  Last Vitals:  Vitals:   10/27/16 1035 10/27/16 1040  BP: (!) 131/94 (!) 141/84  Pulse:    Resp: 13 13  Temp:      Last Pain:  Vitals:   10/27/16 0845  PainSc: 0-No pain         Complications: No apparent anesthesia complications

## 2016-10-27 NOTE — Op Note (Signed)
[facility] Patient Name: Paul Arias Procedure Date: 10/27/2016 10:46 AM MRN: [id number] Date of Birth: 10/17/1966 Attending MD: Norvel Richards , MD CSN: [id number] Age: 50 Admit Type: Outpatient Procedure:                Upper GI endoscopy with bx and snare polypectomy Indications:              Surveillance procedure Providers:                Norvel Richards, MD, Hinton Rao, RN, Sherlyn Lees, Technician Referring MD:             Newt Minion, MD Medicines:                Propofol per Anesthesia Complications:            No immediate complications. Estimated Blood Loss:     Estimated blood loss was minimal. Procedure:                Pre-Anesthesia Assessment:                           - Prior to the procedure, a History and Physical                            was performed, and patient medications and                            allergies were reviewed. The patient's tolerance of                            previous anesthesia was also reviewed. The risks                            and benefits of the procedure and the sedation                            options and risks were discussed with the patient.                            All questions were answered, and informed consent                            was obtained. Prior Anticoagulants: The patient has                            taken no previous anticoagulant or antiplatelet                            agents. ASA Grade Assessment: III - A patient with                            severe systemic disease. After reviewing the risks  and benefits, the patient was deemed in                            satisfactory condition to undergo the procedure.                           After obtaining informed consent, the endoscope was                            passed under direct vision. Throughout the                            procedure, the patient's blood  pressure, pulse, and                            oxygen saturations were monitored continuously. The                            [id number]) was introduced through the and                            advanced to the third part of duodenum. The patient                            tolerated the procedure well. Scope In: 10:55:44 AM Scope Out: 11:11:44 AM Total Procedure Duration: 0 hours 16 minutes 0 seconds  Findings:      2 cm "tongue" of salmon -colored epitheliium coming up from the GE       junction.. The maximum longitudinal extent of these mucosal changes was       1 cm in length.      The entire examined stomach was normal. Previously noted gastric ulcer       healed      Two 5 mm pedunculated and sessile polyps were found in the first portion       of the duodenum. clips gone. The polyps were removed with a hot snare.       Resection and retrieval were complete. Estimated blood loss: none. The       polyp was removed with a hot snare. Resection and retrieval were       complete. Estimated blood loss: none. This was biopsied with a cold       forceps for histology.      The exam was otherwise without abnormality. Impression:               Biopsy performed                           - ? Barrett's esophagus. biopsied                           - Normal stomach.                           - Two duodenal polyps. Resected and retrieved. Marland Kitchen                           -  The examination was otherwise normal. Moderate Sedation:      Moderate (conscious) sedation was personally administered by an       anesthesia professional. The following parameters were monitored: oxygen       saturation, heart rate, blood pressure, respiratory rate, EKG, adequacy       of pulmonary ventilation, and response to care. Total physician       intraservice time was 26 minutes. Recommendation:           - Patient has a contact number available for                            emergencies. The signs and symptoms  of potential                            delayed complications were discussed with the                            patient. Return to normal activities tomorrow.                            Written discharge instructions were provided to the                            patient.                           - Resume previous diet.                           - Continue present medications. No aspirin or NSAID                            products                           - Repeat upper endoscopy in 3 years for                            surveillance based on pathology results.                           - Return to GI office (date not yet determined). Procedure Code(s):        --- Professional ---                           [id number], Esophagogastroduodenoscopy, flexible,                            transoral; with removal of tumor(s), polyp(s), or                            other lesion(s) by snare technique                           43239, 59, Esophagogastroduodenoscopy, flexible,  transoral; with biopsy, single or multiple Diagnosis Code(s):        --- Professional ---                           K22.70, Barrett's esophagus without dysplasia                           K31.7, Polyp of stomach and duodenum CPT copyright 2016 American [employer] Association. All rights reserved. The codes documented in this report are preliminary and upon coder review may  be revised to meet current compliance requirements. [name]. Laydon Martis, MD Norvel Richards, MD 10/27/2016 1:17:06 PM This report has been signed electronically. Number of Addenda: 0

## 2016-10-27 NOTE — Interval H&P Note (Signed)
History and Physical Interval Note:  10/27/2016 10:30 AM  Paul Arias  has presented today for surgery, with the diagnosis of gastric ucler  The various methods of treatment have been discussed with the patient and family. After consideration of risks, benefits and other options for treatment, the patient has consented to  Procedure(s) with comments: ESOPHAGOGASTRODUODENOSCOPY (EGD) WITH PROPOFOL (N/A) - 1015 as a surgical intervention .  The patient's history has been reviewed, patient examined, no change in status, stable for surgery.  I have reviewed the patient's chart and labs.  Questions were answered to the patient's satisfaction.     Paul Arias  No change. Follow-up EGD per plan.  The risks, benefits, limitations, alternatives and imponderables have been reviewed with the patient. Potential for esophageal dilation, biopsy, etc. have also been reviewed.  Questions have been answered. All parties agreeable.

## 2016-10-27 NOTE — Anesthesia Preprocedure Evaluation (Signed)
Anesthesia Evaluation  Patient identified by MRN, date of birth, ID band Patient awake    Reviewed: Allergy & Precautions, NPO status , Patient's Chart, lab work & pertinent test results  Airway Mallampati: I  TM Distance: >3 FB     Dental  (+) Poor Dentition, Edentulous Upper   Pulmonary COPD, Current Smoker,    breath sounds clear to auscultation       Cardiovascular negative cardio ROS   Rhythm:Regular Rate:Normal     Neuro/Psych    GI/Hepatic PUD, GERD  ,(+)     substance abuse  alcohol use, Lower GI bleeding with anemia   Endo/Other    Renal/GU      Musculoskeletal   Abdominal   Peds  Hematology  (+) anemia ,   Anesthesia Other Findings   Reproductive/Obstetrics                             Anesthesia Physical Anesthesia Plan  ASA: III  Anesthesia Plan: MAC   Post-op Pain Management:    Induction: Intravenous  Airway Management Planned: Simple Face Mask  Additional Equipment:   Intra-op Plan:   Post-operative Plan:   Informed Consent: I have reviewed the patients History and Physical, chart, labs and discussed the procedure including the risks, benefits and alternatives for the proposed anesthesia with the patient or authorized representative who has indicated his/her understanding and acceptance.     Plan Discussed with:   Anesthesia Plan Comments:         Anesthesia Quick Evaluation

## 2016-10-27 NOTE — Anesthesia Postprocedure Evaluation (Signed)
Anesthesia Post Note  Patient: Paul Arias  Procedure(s) Performed: Procedure(s) (LRB): ESOPHAGOGASTRODUODENOSCOPY (EGD) WITH PROPOFOL (N/A)  Patient location during evaluation: PACU Anesthesia Type: MAC Level of consciousness: awake, oriented and patient cooperative Pain management: pain level controlled Vital Signs Assessment: post-procedure vital signs reviewed and stable Respiratory status: spontaneous breathing and nonlabored ventilation Cardiovascular status: blood pressure returned to baseline Postop Assessment: no signs of nausea or vomiting Anesthetic complications: no    Last Vitals:  Vitals:   10/27/16 1035 10/27/16 1040  BP: (!) 131/94 (!) 141/84  Pulse:    Resp: 13 13  Temp:      Last Pain:  Vitals:   10/27/16 0845  PainSc: 0-No pain                 Heavenly Christine J

## 2016-10-27 NOTE — H&P (View-Only) (Signed)
Primary Care Physician:  Robert Bellow, MD  Primary Gastroenterologist:  Garfield Cornea, MD   Chief Complaint  Patient presents with  . Follow-up    was in hospital, throat sore sometimes in the morning. ?EGD set-up    HPI:  Paul Arias is a 50 y.o. male here with history of alcohol abuse who we saw during admission on 08/20/16 for acute onset upper GI bleed status post EGD/colonoscopy on 08/19/2016 for colon cancer screening and reflux disease/dysphagia by Dr. Arnoldo Morale. Patient was noted to have villous appearing mucosa in the first portion of the duodenum which was resected (pathology revealed tubular adenoma), also 2 polyps removed from the mid sigmoid colon which were tubular adenomas. Patient developed acute onset blood in the stool, vomiting without hematemesis, weakness. Dr. Arnoldo Morale initially perform flexible sigmoidoscopy to identify blood source, per his note hemostasis clip was placed in the sigmoid colon presumably at site of prior polypectomy. Repeat upper endoscopy was undertaken and Dr. Gala Romney was asked to assist emergently. He was found to have nonbleeding gastric ulcer. In the duodenum there was a cratered ulcerated area with adherent clot in the second portion measuring 25 mm, 2 areas of raised clot within the polypectomy crater consistent with visible vessel stigmata. Heat up margins consistent with prior report of incomplete removal. Patient had bleeding control therapy to include for hemostasis clips. This resulted in no further GI bleeding.Patient Hemoglobin at time of discharge 9.2. Received 2 units of prbcs during admission.   Patient presents today to schedule his 3 month follow-up EGD. He needs to have EGD to follow-up on gastric ulcer, likely related to aspirin powder use as well as duodenal area. Clinically he states he's doing well. He's had no further signs of bleeding. No melena rectal bleeding. Denies any bowel concerns. No abdominal pain. Denies any heartburn,  dysphagia. Notes that he is try to cut back on alcohol consumption. He quit liquor. He is down to 24 ounces of beer daily.  Current Outpatient Prescriptions  Medication Sig Dispense Refill  . ALPRAZolam (XANAX) 1 MG tablet Take 1 mg by mouth at bedtime.     Marland Kitchen atorvastatin (LIPITOR) 40 MG tablet Take 40 mg by mouth daily at 6 PM.     . fenofibrate (TRICOR) 145 MG tablet Take 145 mg by mouth daily.     Marland Kitchen HYDROcodone-acetaminophen (NORCO) 10-325 MG tablet Take 1 tablet by mouth every 6 (six) hours as needed.     Marland Kitchen omeprazole (PRILOSEC) 20 MG capsule Take 1 capsule (20 mg total) by mouth 2 (two) times daily before a meal. 60 capsule 3  . vitamin B-12 (CYANOCOBALAMIN) 1000 MCG tablet Take 1,000 mcg by mouth daily.     No current facility-administered medications for this visit.     Allergies as of 10/17/2016  . (No Known Allergies)    Past Medical History:  Diagnosis Date  . GERD (gastroesophageal reflux disease)   . Hypercholesteremia   . Hyperlipidemia     Past Surgical History:  Procedure Laterality Date  . COLONOSCOPY N/A 08/19/2016   Dr. Aviva Signs: 2 semi-pedunculated polyps in the distal sigmoid colon removed. Measure 2-5 mm. Tubular adenomas.  . ESOPHAGOGASTRODUODENOSCOPY N/A 08/19/2016   Dr. Arnoldo Morale: Villous appearing mucosa in the first portion of the duodenum, polyp removed with hot snare, pathology tubular adenoma. Medium sized hiatal hernia. Biopsy for CLOtest which was negative.  . ESOPHAGOGASTRODUODENOSCOPY (EGD) WITH PROPOFOL  08/20/2016   Dr. Gala Romney: assisted Dr. Arnoldo Morale emergently. Esophagus normal. Medium sized  hiatal hernia. One nonbleeding cratered stellate-shaped gastric ulcer measuring 8 mm. One cratered ulcerated area with adherent clot in the second portion of the duodenum. 25 mm in diameter. Visible vessel seen. Status post bleeding control therapy. Heaped up margins consistent with incomplete removal.   . FLEXIBLE SIGMOIDOSCOPY N/A 08/20/2016   Dr. Arnoldo Morale:  Hematin found in mid sigmoid colon. One hemostatic clip placed.  . NO PAST SURGERIES      Family History  Problem Relation Age of Onset  . Cirrhosis Mother   . Heart disease Father   . Cancer Brother     esophageal cancer    Social History   Social History  . Marital status: Single    Spouse name: N/A  . Number of children: N/A  . Years of education: N/A   Occupational History  . Not on file.   Social History Main Topics  . Smoking status: Current Every Day Smoker    Packs/day: 0.25    Years: 40.00    Types: Cigarettes  . Smokeless tobacco: Never Used  . Alcohol use 10.8 oz/week    14 Cans of beer, 4 Shots of liquor per week     Comment: as of 10/17/16 patient reports cut back to 24 ounces of beer daily and no liquor  . Drug use: No  . Sexual activity: Not on file   Other Topics Concern  . Not on file   Social History Narrative  . No narrative on file      ROS:  General: Negative for anorexia, weight loss, fever, chills, fatigue, weakness. Eyes: Negative for vision changes.  ENT: Negative for hoarseness, difficulty swallowing , nasal congestion. CV: Negative for chest pain, angina, palpitations, dyspnea on exertion, peripheral edema.  Respiratory: Negative for dyspnea at rest, dyspnea on exertion, cough, sputum, wheezing.  GI: See history of present illness. GU:  Negative for dysuria, hematuria, urinary incontinence, urinary frequency, nocturnal urination.  MS: Negative for joint pain,Positive Chronic low back pain.  Derm: Negative for rash or itching.  Neuro: Negative for weakness, abnormal sensation, seizure, frequent headaches, memory loss, confusion.  Psych: Negative for anxiety, depression, suicidal ideation, hallucinations.  Endo: Negative for unusual weight change.  Heme: Negative for bruising or bleeding. Allergy: Negative for rash or hives.    Physical Examination:  BP (!) 146/81   Pulse 81   Temp 97.6 F (36.4 C) (Oral)   Ht 6\' 3"  (1.905 m)    Wt 178 lb 3.2 oz (80.8 kg)   BMI 22.27 kg/m    General: Well-nourished, well-developed in no acute distress.  Head: Normocephalic, atraumatic.   Eyes: Conjunctiva pink, no icterus. Mouth: Oropharyngeal mucosa moist and pink , no lesions erythema or exudate. Neck: Supple without thyromegaly, masses, or lymphadenopathy.  Lungs: Clear to auscultation bilaterally.  Heart: Regular rate and rhythm, no murmurs rubs or gallops.  Abdomen: Bowel sounds are normal, nontender, nondistended, no hepatosplenomegaly or masses, no abdominal bruits or    hernia , no rebound or guarding.   Rectal: Not performed Extremities: No lower extremity edema. No clubbing or deformities.  Neuro: Alert and oriented x 4 , grossly normal neurologically.  Skin: Warm and dry, no rash or jaundice.   Psych: Alert and cooperative, normal mood and affect.  Labs: Lab Results  Component Value Date   WBC 10.1 08/21/2016   HGB 9.2 (L) 08/21/2016   HCT 26.6 (L) 08/21/2016   MCV 90.5 08/21/2016   PLT 193 08/21/2016   Lab Results  Component Value  Date   CREATININE 0.72 08/20/2016   BUN 15 08/20/2016   NA 142 08/20/2016   K 3.6 08/20/2016   CL 107 08/20/2016   CO2 23 08/20/2016   Lab Results  Component Value Date   ALT 23 08/19/2016   AST 33 08/19/2016   ALKPHOS 43 08/19/2016   BILITOT 0.7 08/19/2016     Imaging Studies: No results found.

## 2016-10-27 NOTE — Interval H&P Note (Signed)
History and Physical Interval Note:  10/27/2016 9:18 AM  Paul Arias  has presented today for surgery, with the diagnosis of gastric ucler  The various methods of treatment have been discussed with the patient and family. After consideration of risks, benefits and other options for treatment, the patient has consented to  Procedure(s) with comments: ESOPHAGOGASTRODUODENOSCOPY (EGD) WITH PROPOFOL (N/A) - 1015 as a surgical intervention .  The patient's history has been reviewed, patient examined, no change in status, stable for surgery.  I have reviewed the patient's chart and labs.  Questions were answered to the patient's satisfaction.     [name]  No change. Follow-up EGD per plan.  The risks, benefits, limitations, alternatives and imponderables have been reviewed with the patient. Potential for esophageal dilation, biopsy, etc. have also been reviewed.  Questions have been answered. All parties agreeable.

## 2016-10-27 NOTE — Discharge Instructions (Signed)
EGD Discharge instructions Please read the instructions outlined below and refer to this sheet in the next few weeks. These discharge instructions provide you with general information on caring for yourself after you leave the hospital. Your doctor may also give you specific instructions. While your treatment has been planned according to the most current medical practices available, unavoidable complications occasionally occur. If you have any problems or questions after discharge, please call your doctor. ACTIVITY  You may resume your regular activity but move at a slower pace for the next 24 hours.   Take frequent rest periods for the next 24 hours.   Walking will help expel (get rid of) the air and reduce the bloated feeling in your abdomen.   No driving for 24 hours (because of the anesthesia (medicine) used during the test).   You may shower.   Do not sign any important legal documents or operate any machinery for 24 hours (because of the anesthesia used during the test).  NUTRITION  Drink plenty of fluids.   You may resume your normal diet.   Begin with a light meal and progress to your normal diet.   Avoid alcoholic beverages for 24 hours or as instructed by your caregiver.  MEDICATIONS  You may resume your normal medications unless your caregiver tells you otherwise.  WHAT YOU CAN EXPECT TODAY  You may experience abdominal discomfort such as a feeling of fullness or gas pains.  FOLLOW-UP  Your doctor will discuss the results of your test with you.  SEEK IMMEDIATE MEDICAL ATTENTION IF ANY OF THE FOLLOWING OCCUR:  Excessive nausea (feeling sick to your stomach) and/or vomiting.   Severe abdominal pain and distention (swelling).   Trouble swallowing.   Temperature over 101 F (37.8 C).  Rectal bleeding or vomiting of blood.   Avoid all aspirin and NSAID products  Further recommendations to follow pending review of pathology report

## 2016-11-01 ENCOUNTER — Encounter: Payer: Self-pay | Admitting: Internal Medicine

## 2016-11-03 ENCOUNTER — Encounter (HOSPITAL_COMMUNITY): Payer: Self-pay | Admitting: Internal Medicine

## 2016-11-03 ENCOUNTER — Telehealth: Payer: Self-pay

## 2016-11-03 NOTE — Telephone Encounter (Signed)
Letter and barretts info in the mail to the pt.

## 2016-11-03 NOTE — Telephone Encounter (Signed)
Per RMR-  Daneil Dolin, MD  Claudina Lick, LPN; Theadora Rama        Send letter to patient.  Send copy of letter with path to referring provider and PCP. plse send pt info on barretts.   Ov in I year. All procedures to be done w propofol

## 2016-11-03 NOTE — Telephone Encounter (Signed)
ON RECALL  °

## 2017-09-11 ENCOUNTER — Encounter: Payer: Self-pay | Admitting: Internal Medicine

## 2017-10-26 ENCOUNTER — Telehealth: Payer: Self-pay | Admitting: Internal Medicine

## 2017-10-26 NOTE — Telephone Encounter (Signed)
LM for a return call.  

## 2017-10-26 NOTE — Telephone Encounter (Signed)
Pt called to ask what can he take and what should he avoid taking since he has a history of stomach ulcers. He can be reached at [phone]

## 2017-10-26 NOTE — Telephone Encounter (Signed)
Spoke with about medications that he was asked to avoid. Pt was asked to avoid NSAIDs. Pt is aware. Also mentioned to pt that a letter was mailed 09/2017 about scheduling his TSC/EGD. Pt said he will call back when ready to schedule that.

## 2019-08-09 ENCOUNTER — Other Ambulatory Visit (HOSPITAL_COMMUNITY): Payer: Self-pay | Admitting: Family Medicine

## 2019-08-09 ENCOUNTER — Other Ambulatory Visit: Payer: Self-pay | Admitting: Family Medicine

## 2019-08-09 DIAGNOSIS — F17210 Nicotine dependence, cigarettes, uncomplicated: Secondary | ICD-10-CM

## 2019-08-24 ENCOUNTER — Ambulatory Visit (HOSPITAL_COMMUNITY): Payer: 59

## 2019-08-24 ENCOUNTER — Encounter (HOSPITAL_COMMUNITY): Payer: Self-pay

## 2019-08-31 ENCOUNTER — Ambulatory Visit (HOSPITAL_COMMUNITY): Payer: 59

## 2019-09-19 ENCOUNTER — Ambulatory Visit (HOSPITAL_COMMUNITY)
Admission: RE | Admit: 2019-09-19 | Discharge: 2019-09-19 | Disposition: A | Discharge status: Home / Self Care | Payer: 59 | Source: Ambulatory Visit | Attending: Family Medicine | Admitting: Family Medicine

## 2019-09-19 DIAGNOSIS — F17210 Nicotine dependence, cigarettes, uncomplicated: Secondary | ICD-10-CM | POA: Diagnosis present

## 2019-09-27 ENCOUNTER — Encounter: Payer: Self-pay | Admitting: Internal Medicine

## 2019-11-13 NOTE — Progress Notes (Signed)
Primary Care Physician:  Lemmie Evens, MD Primary Gastroenterologist:  Dr. Gala Romney  Chief Complaint  Patient presents with  . Colonoscopy    recall for TCS/EGD; doing ok    HPI:   Paul Arias is a 53 y.o. male presenting today for follow-up and to schedule EGD/TCS. History of alcohol abuse and acute upper GI bleed following EGD/colonoscopy on 08/19/2016 for colon cancer screening and reflux disease/dysphagia by Dr. Arnoldo Morale. Patient was noted to have villous appearing mucosa in the first portion of the duodenum which was resected (pathology revealed tubular adenoma), also 2 polyps removed from the mid sigmoid colon which were tubular adenomas. Patient developed acute onset blood in the stool, vomiting without hematemesis. Dr. Arnoldo Morale initially perform flexible sigmoidoscopy to identify blood source, per his note hemostasis clip was placed in the sigmoid colon presumably at site of prior polypectomy. Repeat upper endoscopy was undertaken and Dr. Gala Romney was asked to assist emergently. He was found to have nonbleeding gastric ulcer. In the duodenum there was a cratered ulcerated area with adherent clot in the second portion measuring 25 mm, 2 areas of raised clot within the polypectomy crater consistent with visible vessel stigmata. Heat up margins consistent with prior report of incomplete removal. Patient had bleeding control therapy to include for hemostasis clips.   Patient was last seen in our office on 10/17/2016 to schedule his follow-up EGD.  He was doing well without any overt GI bleeding.  No longer consuming aspirin powders.  Trying to cut back on alcohol consumption.   EGD on 10/27/2016: ? Barrett's esophagus s/p biopsy, normal stomach with previous gastric ulcer healed, 2 duodenal polyps resected, otherwise normal.  Duodenal biopsy with fragments of small bowel adenoma, esophageal biopsy consistent with Barrett's esophagus without dysplasia or malignancy.  Recommendations to repeat  upper endoscopy in 1 year.   He was also advised to repeat colonoscopy in 3 years due to tubular adenomas identified on TCS in September by Dr. Arnoldo Morale.   Today he has no acute complaints. No abdominal pain. No blood in the stool. No black stool. BMs daily. No constipation or diarrhea. Taking omeprazole every other day. States PCP told him it may cause dementia.  We discussed recommendations for daily PPI with history of Barrett's esophagus.  Patient was agreeable with resuming omeprazole daily.  Denies excessive alcohol use reflux symptoms. No NSAIDs. No dysphagia. No unintentional weight loss. No nausea or vomiting.      Past Medical History:  Diagnosis Date  . COPD (chronic obstructive pulmonary disease) ([place])   . GERD (gastroesophageal reflux disease)   . Hypercholesteremia   . Hyperlipidemia     Past Surgical History:  Procedure Laterality Date  . COLONOSCOPY N/A 08/19/2016   Dr. Aviva Signs: 2 semi-pedunculated polyps in the distal sigmoid colon removed. Measure 2-5 mm. Tubular adenomas.  . ESOPHAGOGASTRODUODENOSCOPY N/A 08/19/2016   Dr. Arnoldo Morale: Villous appearing mucosa in the first portion of the duodenum, polyp removed with hot snare, pathology tubular adenoma. Medium sized hiatal hernia. Biopsy for CLOtest which was negative.  . ESOPHAGOGASTRODUODENOSCOPY (EGD) WITH PROPOFOL  08/20/2016   Dr. Gala Romney: assisted Dr. Arnoldo Morale emergently. Esophagus normal. Medium sized hiatal hernia. One nonbleeding cratered stellate-shaped gastric ulcer measuring 8 mm. One cratered ulcerated area with adherent clot in the second portion of the duodenum. 25 mm in diameter. Visible vessel seen. Status post bleeding control therapy. Heaped up margins consistent with incomplete removal.   . ESOPHAGOGASTRODUODENOSCOPY (EGD) WITH PROPOFOL N/A 10/27/2016   PROPOFOL;  Surgeon: Daneil Dolin, MD; Barrett's esophagus without dysplasia or malignancy.  Normal stomach with previous gastric ulcer healed.  2 duodenal  polyps revealing fragments of small bowel adenoma, otherwise normal.  Recommendations to repeat endoscopy in 1 year.  Marland Kitchen FLEXIBLE SIGMOIDOSCOPY N/A 08/20/2016   Dr. Arnoldo Morale: Hematin found in mid sigmoid colon. One hemostatic clip placed.    Current Outpatient Medications  Medication Sig Dispense Refill  . ALPRAZolam (XANAX) 1 MG tablet Take 2 mg by mouth at bedtime.     Marland Kitchen atorvastatin (LIPITOR) 40 MG tablet Take 40 mg by mouth daily at 6 PM.     . Biotin 10000 MCG TABS Take 1 tablet by mouth daily.    . Calcium Carb-Cholecalciferol (CALCIUM 600/VITAMIN D3) 600-800 MG-UNIT TABS Take 1 tablet by mouth daily.    . cetirizine (ZYRTEC) 10 MG tablet Take 10 mg by mouth daily.    . cholecalciferol (VITAMIN D3) 25 MCG (1000 UT) tablet Take 1,000 Units by mouth daily.    . fenofibrate (TRICOR) 145 MG tablet Take 145 mg by mouth daily.     Marland Kitchen HYDROcodone-acetaminophen (NORCO) 10-325 MG tablet Take 1 tablet by mouth every 6 (six) hours as needed (for pain).     . magnesium oxide (MAG-OX) 400 MG tablet Take 400 mg by mouth daily.    . naphazoline-glycerin (CLEAR EYES) 0.012-0.2 % SOLN Place 1-2 drops into both eyes 4 (four) times daily as needed for irritation.    Marland Kitchen omeprazole (PRILOSEC) 20 MG capsule Take 1 capsule (20 mg total) by mouth 2 (two) times daily before a meal. (Patient taking differently: Take 20 mg by mouth every other day. ) 60 capsule 3  . vitamin B-12 (CYANOCOBALAMIN) 500 MCG tablet Take 500 mcg by mouth daily.    . Zinc 50 MG TABS Take by mouth daily.    . Na Sulfate-K Sulfate-Mg Sulf (SUPREP BOWEL PREP KIT) 17.5-3.13-1.6 GM/177ML SOLN Take 1 kit by mouth as directed. 354 mL 0   No current facility-administered medications for this visit.     Allergies as of 11/14/2019  . (No Known Allergies)    Family History  Problem Relation Age of Onset  . Cirrhosis Mother   . Heart disease Father   . Cancer Brother        esophageal cancer  . Colon cancer Neg Hx     Social History    Socioeconomic History  . Marital status: Single    Spouse name: Not on file  . Number of children: Not on file  . Years of education: Not on file  . Highest education level: Not on file  Occupational History  . Not on file  Social Needs  . Financial resource strain: Not on file  . Food insecurity    Worry: Not on file    Inability: Not on file  . Transportation needs    Medical: Not on file    Non-medical: Not on file  Tobacco Use  . Smoking status: Current Every Day Smoker    Packs/day: 0.25    Years: 40.00    Pack years: 10.00    Types: Cigarettes  . Smokeless tobacco: Never Used  Substance and Sexual Activity  . Alcohol use: Yes    Alcohol/week: 0.0 standard drinks    Comment: 3-4 beers/day and liquor  . Drug use: No  . Sexual activity: Never    Birth control/protection: None  Lifestyle  . Physical activity    Days per week: Not on file  Minutes per session: Not on file  . Stress: Not on file  Relationships  . Social Herbalist on phone: Not on file    Gets together: Not on file    Attends religious service: Not on file    Active member of club or organization: Not on file    Attends meetings of clubs or organizations: Not on file    Relationship status: Not on file  . Intimate partner violence    Fear of current or ex partner: Not on file    Emotionally abused: Not on file    Physically abused: Not on file    Forced sexual activity: Not on file  Other Topics Concern  . Not on file  Social History Narrative  . Not on file    Review of Systems: Gen: Denies any fever, chills, lightheadedness, dizziness, presyncope, or syncope. HEENT: Denies nasal congestion or sore throat. CV: Denies chest pain, heart palpitations Resp: Shortness of breath at times when walking up a hill or up steps. Reports history of emphysema. No regular cough.  GI: See HPI GU : Denies urinary burning, urinary frequency, urinary hesitancy MS: Chronic back pain.  Derm:  Denies rash Psych: Denies depression, anxiety Heme: Denies bruising, bleeding  Physical Exam: BP (!) 164/94   Pulse 89   Temp (!) 96.8 F (36 C) (Temporal)   Ht 6' 3" (1.905 m)   Wt 195 lb 3.2 oz (88.5 kg)   BMI 24.40 kg/m  General:   Alert and oriented. Pleasant and cooperative. Well-nourished and well-developed.  Head:  Normocephalic and atraumatic. Eyes:  Without icterus, sclera clear and conjunctiva pink.  Ears:  Normal auditory acuity. Lungs:  Clear to auscultation bilaterally. No wheezes, rales, or rhonchi. No distress.  Heart:  S1, S2 present without murmurs appreciated.  Abdomen:  +BS, soft, non-tender and non-distended. No HSM noted. No guarding or rebound. No masses appreciated.  Rectal:  Deferred  Msk:  Symmetrical without gross deformities. Normal posture. Extremities:  Without edema. Neurologic:  Alert and  oriented x4;  grossly normal neurologically. Skin:  Intact without significant lesions or rashes. Psych:  Normal mood and affect.

## 2019-11-14 ENCOUNTER — Other Ambulatory Visit: Payer: Self-pay

## 2019-11-14 ENCOUNTER — Encounter: Payer: Self-pay | Admitting: Gastroenterology

## 2019-11-14 ENCOUNTER — Ambulatory Visit: Payer: 59 | Admitting: Gastroenterology

## 2019-11-14 VITALS — BP 164/94 | HR 89 | Temp 96.8°F | Ht 75.0 in | Wt 195.2 lb

## 2019-11-14 DIAGNOSIS — Z86010 Personal history of colonic polyps: Secondary | ICD-10-CM

## 2019-11-14 DIAGNOSIS — K2270 Barrett's esophagus without dysplasia: Secondary | ICD-10-CM

## 2019-11-14 DIAGNOSIS — D132 Benign neoplasm of duodenum: Secondary | ICD-10-CM

## 2019-11-14 DIAGNOSIS — K219 Gastro-esophageal reflux disease without esophagitis: Secondary | ICD-10-CM

## 2019-11-14 DIAGNOSIS — Z8719 Personal history of other diseases of the digestive system: Secondary | ICD-10-CM | POA: Insufficient documentation

## 2019-11-14 DIAGNOSIS — Z8711 Personal history of peptic ulcer disease: Secondary | ICD-10-CM

## 2019-11-14 MED ORDER — SUPREP BOWEL PREP KIT 17.5-3.13-1.6 GM/177ML PO SOLN: 1.0000 | ORAL | 0 refills | Status: DC

## 2019-11-14 NOTE — Assessment & Plan Note (Addendum)
EGD on 08/19/2016 with villous appearing mucosa in the first portion of the duodenum s/p resection.  Pathology revealed tubular adenoma.  Repeat upper endoscopy on 08/20/2016 due to hematemesis revealing nonbleeding gastric ulcer, cratered ulcer with adherent clot and second portion of the duodenum and 2 areas of raised clot with polypectomy crater consistent with visible vessel stigmata.  Hemostasis clips were placed.  Follow-up EGD on 10/27/2016 revealed 2 duodenal polyps resected.  Duodenal biopsy with fragments of small bowel adenoma.  EGD at that time also revealed Barrett's esophagus without dysplasia or malignancy.  Previous gastric ulcer had healed.  He was advised to have upper endoscopy in 1 year but this was not completed.  He is without any significant upper or lower GI symptoms at this time.  No NSAIDs.  No bright red blood per rectum, melena, or unintentional weight loss.  Taking omeprazole every other day which keeps GERD symptoms well controlled.  Proceed with EGD at the time of colonoscopy with propofol with Dr. Gala Romney in the near future. The risks, benefits, and alternatives have been discussed in detail with patient. They have stated understanding and desire to proceed.  Resume taking omeprazole 20 mg daily due to history of Barrett's esophagus. Continue to avoid all NSAIDs. Discussed the importance of working towards alcohol cessation.  Follow-up after procedures.

## 2019-11-14 NOTE — Patient Instructions (Addendum)
Follow-up with PCP on elevated BP today.   We will get you scheduled for your surveillance colonoscopy and upper endoscopy in the near future with Dr. Gala Romney.   Please resume taking your omeprazole every day.   Continue to avoid all NSAIDs including ibuprofen, Aleve, Advil, and Goody Powders.   Follow-up as recommended at the time of procedures.   Aliene Altes, PA-C [facility]

## 2019-11-14 NOTE — Assessment & Plan Note (Signed)
GERD symptoms are well controlled on omeprazole 20 mg every other day.  Due to history of Barrett's esophagus, discussed need for PPI daily.  Patient is agreeable to resume taking omeprazole 20 mg every day.  He is due for colon cancer surveillance as well as surveillance of Barrett's esophagus as discussed below.  Plan to follow-up after procedures.

## 2019-11-14 NOTE — Assessment & Plan Note (Addendum)
53 year old male with past medical history of COPD, GERD, Barrett's esophagus, colonic tubular adenomas, small bowel adenoma, gastric ulcer and duodenal ulcer in 2017, and HLD who presents to schedule his surveillance colonoscopy.  Last colonoscopy by Dr. Arnoldo Morale in September 2017 with 2 semipedunculated polyps in the distal sigmoid colon measuring 2-5 mm.  Pathology revealed tubular adenomas.  Recommendations to repeat colonoscopy in 3 years.  No significant upper or lower GI symptoms at this time.  Denies bright red blood per rectum, melena, or unintentional weight loss.  No family history of colon cancer.  Of note, he is also due for Barrett's esophagus surveillance.  Proceed with TCS + EGD with propofol with Dr. Gala Romney in the near future. The risks, benefits, and alternatives have been discussed in detail with patient. They have stated understanding and desire to proceed.  Follow-up after procedures.

## 2019-11-14 NOTE — Assessment & Plan Note (Signed)
EGD on 10/27/2016 with Barrett's esophagus without dysplasia or malignancy.  Also with normal stomach with previous gastric ulcer healed,  2 duodenal polyps revealing fragments of small bowel adenoma.  Otherwise normal.  It was recommended for patient to have repeat upper endoscopy in 1 year but this was not completed.  Chronic history of GERD.  Currently on omeprazole every other day which keeps his symptoms well controlled.  Denies any other significant upper or lower GI symptoms.  No bright red blood per rectum, melena, unintentional weight loss, or dysphagia.  No NSAIDs.  He does drink alcohol daily. Of note, patient is also due for colon cancer surveillance.  Proceed with EGD at the time of colonoscopy with propofol with Dr. Gala Romney in the near future. The risks, benefits, and alternatives have been discussed in detail with patient. They have stated understanding and desire to proceed.  Resume taking omeprazole 20 mg daily. Continue to avoid all NSAIDs. Discussed the importance of working towards alcohol cessation.  Follow-up after procedures.

## 2019-11-15 ENCOUNTER — Other Ambulatory Visit: Payer: Self-pay

## 2019-11-15 NOTE — Patient Instructions (Signed)
PA for TCS/EGD submitted via [facility] website. PA# [id number], valid 02/09/20-05/09/20.

## 2019-11-15 NOTE — Progress Notes (Signed)
CC'ED TO PCP 

## 2020-02-07 ENCOUNTER — Telehealth: Payer: Self-pay | Admitting: Internal Medicine

## 2020-02-07 ENCOUNTER — Encounter (HOSPITAL_COMMUNITY): Payer: Self-pay

## 2020-02-07 ENCOUNTER — Other Ambulatory Visit (HOSPITAL_COMMUNITY)
Admission: RE | Admit: 2020-02-07 | Discharge: 2020-02-07 | Disposition: A | Discharge status: Home / Self Care | Payer: 59 | Source: Ambulatory Visit | Attending: Internal Medicine | Admitting: Internal Medicine

## 2020-02-07 ENCOUNTER — Encounter (HOSPITAL_COMMUNITY)
Admission: RE | Admit: 2020-02-07 | Discharge: 2020-02-07 | Disposition: A | Discharge status: Home / Self Care | Payer: 59 | Source: Ambulatory Visit | Attending: Internal Medicine | Admitting: Internal Medicine

## 2020-02-07 ENCOUNTER — Other Ambulatory Visit: Payer: Self-pay

## 2020-02-07 ENCOUNTER — Other Ambulatory Visit (HOSPITAL_COMMUNITY): Payer: 59

## 2020-02-07 DIAGNOSIS — Z01812 Encounter for preprocedural laboratory examination: Secondary | ICD-10-CM | POA: Insufficient documentation

## 2020-02-07 DIAGNOSIS — Z20822 Contact with and (suspected) exposure to covid-19: Secondary | ICD-10-CM | POA: Insufficient documentation

## 2020-02-07 LAB — SARS CORONAVIRUS 2 (TAT 6-24 HRS): SARS Coronavirus 2: NEGATIVE

## 2020-02-07 NOTE — Telephone Encounter (Signed)
Pt is schedule with RMR on 02/09/2020 and has questions about his insurance paying for the colonoscopy and not the EGD> [id number]

## 2020-02-07 NOTE — Telephone Encounter (Signed)
LMOVM for pt 

## 2020-02-07 NOTE — Telephone Encounter (Signed)
Patient called back. He states he thought he was not going to have to pay anything for his EGD. I advised patient his EGD is diagnostic and he will have a portion to pay. He voiced understanding.

## 2020-02-09 ENCOUNTER — Ambulatory Visit (HOSPITAL_COMMUNITY)
Admission: RE | Admit: 2020-02-09 | Discharge: 2020-02-09 | Disposition: A | Discharge status: Home / Self Care | Payer: 59 | Attending: Internal Medicine | Admitting: Internal Medicine

## 2020-02-09 ENCOUNTER — Encounter (HOSPITAL_COMMUNITY): Payer: Self-pay | Admitting: Internal Medicine

## 2020-02-09 ENCOUNTER — Ambulatory Visit (HOSPITAL_COMMUNITY): Payer: 59 | Admitting: Anesthesiology

## 2020-02-09 ENCOUNTER — Encounter (HOSPITAL_COMMUNITY)
Admission: RE | Disposition: A | Discharge status: Home / Self Care | Payer: Self-pay | Source: Home / Self Care | Attending: Internal Medicine

## 2020-02-09 ENCOUNTER — Other Ambulatory Visit: Payer: Self-pay

## 2020-02-09 DIAGNOSIS — Z79899 Other long term (current) drug therapy: Secondary | ICD-10-CM | POA: Diagnosis not present

## 2020-02-09 DIAGNOSIS — K219 Gastro-esophageal reflux disease without esophagitis: Secondary | ICD-10-CM | POA: Insufficient documentation

## 2020-02-09 DIAGNOSIS — F17210 Nicotine dependence, cigarettes, uncomplicated: Secondary | ICD-10-CM | POA: Insufficient documentation

## 2020-02-09 DIAGNOSIS — Z86010 Personal history of colonic polyps: Secondary | ICD-10-CM | POA: Diagnosis not present

## 2020-02-09 DIAGNOSIS — J449 Chronic obstructive pulmonary disease, unspecified: Secondary | ICD-10-CM | POA: Diagnosis not present

## 2020-02-09 DIAGNOSIS — K2270 Barrett's esophagus without dysplasia: Secondary | ICD-10-CM

## 2020-02-09 DIAGNOSIS — E7800 Pure hypercholesterolemia, unspecified: Secondary | ICD-10-CM | POA: Insufficient documentation

## 2020-02-09 DIAGNOSIS — D124 Benign neoplasm of descending colon: Secondary | ICD-10-CM | POA: Insufficient documentation

## 2020-02-09 DIAGNOSIS — K3189 Other diseases of stomach and duodenum: Secondary | ICD-10-CM | POA: Insufficient documentation

## 2020-02-09 DIAGNOSIS — D122 Benign neoplasm of ascending colon: Secondary | ICD-10-CM | POA: Diagnosis not present

## 2020-02-09 DIAGNOSIS — Z09 Encounter for follow-up examination after completed treatment for conditions other than malignant neoplasm: Secondary | ICD-10-CM | POA: Diagnosis present

## 2020-02-09 DIAGNOSIS — Z8719 Personal history of other diseases of the digestive system: Secondary | ICD-10-CM | POA: Insufficient documentation

## 2020-02-09 DIAGNOSIS — K449 Diaphragmatic hernia without obstruction or gangrene: Secondary | ICD-10-CM | POA: Diagnosis not present

## 2020-02-09 DIAGNOSIS — K635 Polyp of colon: Secondary | ICD-10-CM

## 2020-02-09 DIAGNOSIS — K621 Rectal polyp: Secondary | ICD-10-CM | POA: Diagnosis not present

## 2020-02-09 DIAGNOSIS — E785 Hyperlipidemia, unspecified: Secondary | ICD-10-CM | POA: Diagnosis not present

## 2020-02-09 HISTORY — PX: POLYPECTOMY: SHX5525

## 2020-02-09 HISTORY — PX: BIOPSY: SHX5522

## 2020-02-09 HISTORY — PX: COLONOSCOPY WITH PROPOFOL: SHX5780

## 2020-02-09 HISTORY — PX: ESOPHAGOGASTRODUODENOSCOPY (EGD) WITH PROPOFOL: SHX5813

## 2020-02-09 SURGERY — COLONOSCOPY WITH PROPOFOL
Anesthesia: General

## 2020-02-09 MED ORDER — LACTATED RINGERS IV SOLN: INTRAVENOUS | Status: DC | PRN

## 2020-02-09 MED ORDER — LIDOCAINE HCL (CARDIAC) PF 100 MG/5ML IV SOSY
PREFILLED_SYRINGE | INTRAVENOUS | Status: DC | PRN
  Administered 2020-02-09: 50 mg via INTRATRACHEAL

## 2020-02-09 MED ORDER — PROPOFOL 500 MG/50ML IV EMUL
INTRAVENOUS | Status: DC | PRN
  Administered 2020-02-09 (×3): 150 ug/kg/min via INTRAVENOUS

## 2020-02-09 MED ORDER — CHLORHEXIDINE GLUCONATE CLOTH 2 % EX PADS: 6.0000 | MEDICATED_PAD | Freq: Once | CUTANEOUS | Status: DC

## 2020-02-09 MED ORDER — PROPOFOL 10 MG/ML IV BOLUS
INTRAVENOUS | Status: DC | PRN
  Administered 2020-02-09 (×3): 20 mg via INTRAVENOUS

## 2020-02-09 MED ORDER — GLYCOPYRROLATE 0.2 MG/ML IJ SOLN
INTRAMUSCULAR | Status: DC | PRN
  Administered 2020-02-09: .1 mg via INTRAVENOUS

## 2020-02-09 MED ORDER — LACTATED RINGERS IV SOLN: Freq: Once | INTRAVENOUS | Status: AC

## 2020-02-09 MED ORDER — KETAMINE HCL 50 MG/5ML IJ SOSY
PREFILLED_SYRINGE | INTRAMUSCULAR | Status: AC
  Filled 2020-02-09: qty 5

## 2020-02-09 MED ORDER — KETAMINE HCL 10 MG/ML IJ SOLN
INTRAMUSCULAR | Status: DC | PRN
  Administered 2020-02-09: 40 mg via INTRAVENOUS
  Administered 2020-02-09: 10 mg via INTRAVENOUS

## 2020-02-09 NOTE — H&P (Signed)
_0 @   Primary Care Physician:  Lemmie Evens, MD Primary Gastroenterologist:  Dr.   Pre-Procedure History & Physical: HPI:  Paul Arias is a 54 y.o. male here for here for surveillance EGD (history of Barrett's esophagus and duodenal adenoma) as well as surveillance colonoscopy-history of colonic adenoma.  No dysphagia.  Reflux symptoms well controlled.  In fact, no lower GI complaints.  Past Medical History:  Diagnosis Date  . COPD (chronic obstructive pulmonary disease) ([place])   . GERD (gastroesophageal reflux disease)   . Hypercholesteremia   . Hyperlipidemia     Past Surgical History:  Procedure Laterality Date  . COLONOSCOPY N/A 08/19/2016   Dr. Aviva Signs: 2 semi-pedunculated polyps in the distal sigmoid colon removed. Measure 2-5 mm. Tubular adenomas.  . ESOPHAGOGASTRODUODENOSCOPY N/A 08/19/2016   Dr. Arnoldo Morale: Villous appearing mucosa in the first portion of the duodenum, polyp removed with hot snare, pathology tubular adenoma. Medium sized hiatal hernia. Biopsy for CLOtest which was negative.  . ESOPHAGOGASTRODUODENOSCOPY (EGD) WITH PROPOFOL  08/20/2016   Dr. Gala Romney: assisted Dr. Arnoldo Morale emergently. Esophagus normal. Medium sized hiatal hernia. One nonbleeding cratered stellate-shaped gastric ulcer measuring 8 mm. One cratered ulcerated area with adherent clot in the second portion of the duodenum. 25 mm in diameter. Visible vessel seen. Status post bleeding control therapy. Heaped up margins consistent with incomplete removal.   . ESOPHAGOGASTRODUODENOSCOPY (EGD) WITH PROPOFOL N/A 10/27/2016   PROPOFOL;  Surgeon: Daneil Dolin, MD; Barrett's esophagus without dysplasia or malignancy.  Normal stomach with previous gastric ulcer healed.  2 duodenal polyps revealing fragments of small bowel adenoma, otherwise normal.  Recommendations to repeat endoscopy in 1 year.  Marland Kitchen FLEXIBLE SIGMOIDOSCOPY N/A 08/20/2016   Dr. Arnoldo Morale: Hematin found in mid sigmoid colon. One hemostatic clip  placed.    Prior to Admission medications   Medication Sig Start Date End Date Taking? Authorizing Provider  ALPRAZolam Duanne Moron) 1 MG tablet Take 2 mg by mouth at bedtime.  07/25/16  Yes [provider]  atorvastatin (LIPITOR) 40 MG tablet Take 40 mg by mouth daily at 6 PM.  07/11/16  Yes [provider]  Biotin 10000 MCG TABS Take 1 tablet by mouth daily.   Yes [provider]  Calcium Carb-Cholecalciferol (CALCIUM 600/VITAMIN D3) 600-800 MG-UNIT TABS Take 1 tablet by mouth daily.   Yes [provider]  cetirizine (ZYRTEC) 10 MG tablet Take 10 mg by mouth daily.   Yes [provider]  cholecalciferol (VITAMIN D3) 25 MCG (1000 UT) tablet Take 1,000 Units by mouth daily.   Yes [provider]  fenofibrate 160 MG tablet Take 160 mg by mouth daily.   Yes [provider]  HYDROcodone-acetaminophen (NORCO) 10-325 MG tablet Take 1 tablet by mouth every 6 (six) hours as needed (for pain).  07/11/16  Yes [provider]  magnesium oxide (MAG-OX) 400 MG tablet Take 400 mg by mouth daily.   Yes [provider]  omeprazole (PRILOSEC) 20 MG capsule Take 1 capsule (20 mg total) by mouth 2 (two) times daily before a meal. Patient taking differently: Take 20 mg by mouth daily.  08/21/16  Yes Aviva Signs, MD  vitamin B-12 (CYANOCOBALAMIN) 500 MCG tablet Take 500 mcg by mouth daily.   Yes [provider]  Zinc 50 MG TABS Take 50 mg by mouth daily.    Yes [provider]  Na Sulfate-K Sulfate-Mg Sulf (SUPREP BOWEL PREP KIT) 17.5-3.13-1.6 GM/177ML SOLN Take 1 kit by mouth as directed. 11/14/19  Daneil Dolin, MD    Allergies as of 11/14/2019  . (No Known Allergies)    Family History  Problem Relation Age of Onset  . Cirrhosis Mother   . Heart disease Father   . Cancer Brother        esophageal cancer  . Colon cancer Neg Hx     Social History   Socioeconomic History  . Marital status: Single    Spouse  name: Not on file  . Number of children: Not on file  . Years of education: Not on file  . Highest education level: Not on file  Occupational History  . Not on file  Tobacco Use  . Smoking status: Current Every Day Smoker    Packs/day: 0.25    Years: 40.00    Pack years: 10.00    Types: Cigarettes  . Smokeless tobacco: Never Used  Substance and Sexual Activity  . Alcohol use: Yes    Alcohol/week: 0.0 standard drinks    Comment: 3-4 beers/day and liquor  . Drug use: No  . Sexual activity: Never    Birth control/protection: None  Other Topics Concern  . Not on file  Social History Narrative  . Not on file   Social Determinants of Health   Financial Resource Strain:   . Difficulty of Paying Living Expenses: Not on file  Food Insecurity:   . Worried About Charity fundraiser in the Last Year: Not on file  . Ran Out of Food in the Last Year: Not on file  Transportation Needs:   . Lack of Transportation (Medical): Not on file  . Lack of Transportation (Non-Medical): Not on file  Physical Activity:   . Days of Exercise per Week: Not on file  . Minutes of Exercise per Session: Not on file  Stress:   . Feeling of Stress : Not on file  Social Connections:   . Frequency of Communication with Friends and Family: Not on file  . Frequency of Social Gatherings with Friends and Family: Not on file  . Attends Religious Services: Not on file  . Active Member of Clubs or Organizations: Not on file  . Attends Archivist Meetings: Not on file  . Marital Status: Not on file  Intimate Partner Violence:   . Fear of Current or Ex-Partner: Not on file  . Emotionally Abused: Not on file  . Physically Abused: Not on file  . Sexually Abused: Not on file    Review of Systems: See HPI, otherwise negative ROS  Physical Exam: BP (!) 145/89   Pulse 76   Temp 98.1 F (36.7 C) (Oral)   Resp 19   Ht _0  (1.905 m)   Wt 89.8 kg   SpO2 96%   BMI 24.74 kg/m  General:   Alert,   Well-developed, well-nourished, pleasant and cooperative in NAD Mouth:  No deformity or lesions. Neck:  Supple; no masses or thyromegaly. No significant cervical adenopathy. Lungs:  Clear throughout to auscultation.   No wheezes, crackles, or rhonchi. No acute distress. Heart:  Regular rate and rhythm; no murmurs, clicks, rubs,  or gallops. Abdomen: Non-distended, normal bowel sounds.  Soft and nontender without appreciable mass or hepatosplenomegaly.  Pulses:  Normal pulses noted. Extremities:  Without clubbing or edema.  Impression/Plan: 54 year old gentleman with history of Barrett's esophagus and duodenal adenoma as well as colonic adenomas.  Patient is here for surveillance upper and colonoscopy.  Patient had post polypectomy bleeding after initial resection previously (Dr.  Arnoldo Morale).  Assistant with therapeutic follow-up endoscopy.  I discussed with the patient there is no way to absolutely prevent post polypectomy bleeding, etc. The risks, benefits, limitations, imponderables and alternatives regarding both EGD and colonoscopy have been reviewed with the patient. Questions have been answered. All parties agreeable.      Notice: This dictation was prepared with Dragon dictation along with smaller phrase technology. Any transcriptional errors that result from this process are unintentional and may not be corrected upon review.

## 2020-02-09 NOTE — Anesthesia Postprocedure Evaluation (Signed)
Anesthesia Post Note  Patient: Paul Arias  Procedure(s) Performed: COLONOSCOPY WITH PROPOFOL (N/A ) ESOPHAGOGASTRODUODENOSCOPY (EGD) WITH PROPOFOL (N/A ) BIOPSY POLYPECTOMY  Patient location during evaluation: PACU Anesthesia Type: General Level of consciousness: awake and alert and patient cooperative Pain management: satisfactory to patient Vital Signs Assessment: post-procedure vital signs reviewed and stable Respiratory status: spontaneous breathing Cardiovascular status: stable Postop Assessment: no apparent nausea or vomiting Anesthetic complications: no     Last Vitals:  Vitals:   02/09/20 1201  BP: (!) 145/89  Pulse: 76  Resp: 19  Temp: 36.7 C  SpO2: 96%    Last Pain:  Vitals:   02/09/20 1201  TempSrc: Oral  PainSc: 3                  Leylah Tarnow

## 2020-02-09 NOTE — Op Note (Signed)
[facility] Patient Name: Paul Arias Procedure Date: 02/09/2020 1:36 PM MRN: [id number] Date of Birth: 31-Jul-1966 Attending MD: Paul Arias , MD CSN: [id number] Age: 54 Admit Type: Outpatient Procedure:                Upper GI endoscopy Indications:              Surveillance for malignancy due to personal history                            of Barrett's esophagus; duodenal adenoma Providers:                Paul Richards, MD, Paul Riggers, RN, Paul Arias, Technician Referring MD:              Medicines:                Propofol per Anesthesia Complications:            No immediate complications. Estimated Blood Loss:     Estimated blood loss was minimal. Procedure:                Pre-Anesthesia Assessment:                           - Prior to the procedure, a History and Physical                            was performed, and patient medications and                            allergies were reviewed. The patient's tolerance of                            previous anesthesia was also reviewed. The risks                            and benefits of the procedure and the sedation                            options and risks were discussed with the patient.                            All questions were answered, and informed consent                            was obtained. Prior Anticoagulants: The patient has                            taken no previous anticoagulant or antiplatelet                            agents. ASA Grade Assessment: III - A patient with  severe systemic disease. After reviewing the risks                            and benefits, the patient was deemed in                            satisfactory condition to undergo the procedure.                           After obtaining informed consent, the endoscope was                            passed under direct vision. Throughout the      procedure, the patient's blood pressure, pulse, and                            oxygen saturations were monitored continuously. The                            [id number] scope was introduced through the                            mouth, and advanced to the second part of duodenum.                            The upper GI endoscopy was accomplished without                            difficulty. The patient tolerated the procedure                            well. Scope In: 1:43:20 PM Scope Out: 1:58:47 PM Total Procedure Duration: 0 hours 15 minutes 27 seconds  Findings:      Accentuated undulating Z-line with a 1.5 cm tongue of salmon-colored       epithelium coming up above the GE junction. 2 very skinny tongues of       salmon-colored epithelium coming up approximately 1 cm adjacent to the       larger tongue. No nodularity. No esophagitis. Biopsies taken.      A small hiatal hernia was present. Otherwise, normal-appearing stomach      Examination of the duodenal bulb, second and third portion revealed at 2       cm lobulated sessile adenomatous appearing lesion in the distal second       portion of the duodenum between 2 folds. There are a couple scattered       smaller 3 to 5 mm similar-appearing lesions in the second portion. There       was a pale 10 by 5 mm adenomatous appearing lesion at the junction of       the bulb and the second portion. In the vicinity of this lesion there       were multiple 5 mm "satellite" appearing lesions. The largest second       portion polypoid lesion was biopsied multiple times. The other lesions       were not manipulated. Impression:               -  Probable short segment Barrett's esophagus.                            Biopsied. Small hiatal hernia. Multiple adenomatous                            appearing duodenal lesions. Status post biopsy of                            the largest one. Moderate Sedation:      Moderate (conscious)  sedation was personally administered by an       anesthesia professional. The following parameters were monitored: oxygen       saturation, heart rate, blood pressure, respiratory rate, EKG, adequacy       of pulmonary ventilation, and response to care. Recommendation:           - Patient has a contact number available for                            emergencies. The signs and symptoms of potential                            delayed complications were discussed with the                            patient. Return to normal activities tomorrow.                            Written discharge instructions were provided to the                            patient.                           - Advance diet as tolerated. Follow-up on                            pathology. Anticipate referral to a center with                            duodenal EMR experience; see colonoscopy report. Procedure Code(s):        --- Professional ---                           [phone], Esophagogastroduodenoscopy, flexible,                            transoral; diagnostic, including collection of                            specimen(s) by brushing or washing, when performed                            (separate procedure) Diagnosis Code(s):        --- Professional ---  K44.9, Diaphragmatic hernia without obstruction or                            gangrene                           K22.70, Barrett's esophagus without dysplasia CPT copyright 2019 [employer]. All rights reserved. The codes documented in this report are preliminary and upon coder review may  be revised to meet current compliance requirements. Paul Arias. Paul Gingras, MD Paul Richards, MD 02/09/2020 2:34:36 PM This report has been signed electronically. Number of Addenda: 0

## 2020-02-09 NOTE — Discharge Instructions (Signed)
Colonoscopy Discharge Instructions  Read the instructions outlined below and refer to this sheet in the next few weeks. These discharge instructions provide you with general information on caring for yourself after you leave the hospital. Your doctor may also give you specific instructions. While your treatment has been planned according to the most current medical practices available, unavoidable complications occasionally occur. If you have any problems or questions after discharge, call Dr. Gala Romney at [phone]. ACTIVITY  You may resume your regular activity, but move at a slower pace for the next 24 hours.   Take frequent rest periods for the next 24 hours.   Walking will help get rid of the air and reduce the bloated feeling in your belly (abdomen).   No driving for 24 hours (because of the medicine (anesthesia) used during the test).    Do not sign any important legal documents or operate any machinery for 24 hours (because of the anesthesia used during the test).  NUTRITION  Drink plenty of fluids.   You may resume your normal diet as instructed by your doctor.   Begin with a light meal and progress to your normal diet. Heavy or fried foods are harder to digest and may make you feel sick to your stomach (nauseated).   Avoid alcoholic beverages for 24 hours or as instructed.  MEDICATIONS  You may resume your normal medications unless your doctor tells you otherwise.  WHAT YOU CAN EXPECT TODAY  Some feelings of bloating in the abdomen.   Passage of more gas than usual.   Spotting of blood in your stool or on the toilet paper.  IF YOU HAD POLYPS REMOVED DURING THE COLONOSCOPY:  No aspirin products for 7 days or as instructed.   No alcohol for 7 days or as instructed.   Eat a soft diet for the next 24 hours.  FINDING OUT THE RESULTS OF YOUR TEST Not all test results are available during your visit. If your test results are not back during the visit, make an appointment  with your caregiver to find out the results. Do not assume everything is normal if you have not heard from your caregiver or the medical facility. It is important for you to follow up on all of your test results.  SEEK IMMEDIATE MEDICAL ATTENTION IF:  You have more than a spotting of blood in your stool.   Your belly is swollen (abdominal distention).   You are nauseated or vomiting.   You have a temperature over 101.   You have abdominal pain or discomfort that is severe or gets worse throughout the day.  EGD Discharge instructions Please read the instructions outlined below and refer to this sheet in the next few weeks. These discharge instructions provide you with general information on caring for yourself after you leave the hospital. Your doctor may also give you specific instructions. While your treatment has been planned according to the most current medical practices available, unavoidable complications occasionally occur. If you have any problems or questions after discharge, please call your doctor. ACTIVITY  You may resume your regular activity but move at a slower pace for the next 24 hours.   Take frequent rest periods for the next 24 hours.   Walking will help expel (get rid of) the air and reduce the bloated feeling in your abdomen.   No driving for 24 hours (because of the anesthesia (medicine) used during the test).   You may shower.   Do not sign any important  legal documents or operate any machinery for 24 hours (because of the anesthesia used during the test).  NUTRITION  Drink plenty of fluids.   You may resume your normal diet.   Begin with a light meal and progress to your normal diet.   Avoid alcoholic beverages for 24 hours or as instructed by your caregiver.  MEDICATIONS  You may resume your normal medications unless your caregiver tells you otherwise.  WHAT YOU CAN EXPECT TODAY  You may experience abdominal discomfort such as a feeling of fullness  or "gas" pains.  FOLLOW-UP  Your doctor will discuss the results of your test with you.  SEEK IMMEDIATE MEDICAL ATTENTION IF ANY OF THE FOLLOWING OCCUR:  Excessive nausea (feeling sick to your stomach) and/or vomiting.   Severe abdominal pain and distention (swelling).   Trouble swallowing.   Temperature over 101 F (37.8 C).   Rectal bleeding or vomiting of blood.   GERD information provided  Continue omeprazole 20 mg daily.  Take every day whether you feel you need it or not.  Multiple polyps in your duodenum and colon.  Further recommendations to follow pending review of pathology report  At patient request, I called daughter, Sharyn Lull, at [phone] and reviewed results.     Gastroesophageal Reflux Disease, Adult Gastroesophageal reflux (GER) happens when acid from the stomach flows up into the tube that connects the mouth and the stomach (esophagus). Normally, food travels down the esophagus and stays in the stomach to be digested. With GER, food and stomach acid sometimes move back up into the esophagus. You may have a disease called gastroesophageal reflux disease (GERD) if the reflux:  Happens often.  Causes frequent or very bad symptoms.  Causes problems such as damage to the esophagus. When this happens, the esophagus becomes sore and swollen (inflamed). Over time, GERD can make small holes (ulcers) in the lining of the esophagus. What are the causes? This condition is caused by a problem with the muscle between the esophagus and the stomach. When this muscle is weak or not normal, it does not close properly to keep food and acid from coming back up from the stomach. The muscle can be weak because of:  Tobacco use.  Pregnancy.  Having a certain type of hernia (hiatal hernia).  Alcohol use.  Certain foods and drinks, such as coffee, chocolate, onions, and peppermint. What increases the risk? You are more likely to develop this condition if you:  Are  overweight.  Have a disease that affects your connective tissue.  Use NSAID medicines. What are the signs or symptoms? Symptoms of this condition include:  Heartburn.  Difficult or painful swallowing.  The feeling of having a lump in the throat.  A bitter taste in the mouth.  Bad breath.  Having a lot of saliva.  Having an upset or bloated stomach.  Belching.  Chest pain. Different conditions can cause chest pain. Make sure you see your doctor if you have chest pain.  Shortness of breath or noisy breathing (wheezing).  Ongoing (chronic) cough or a cough at night.  Wearing away of the surface of teeth (tooth enamel).  Weight loss. How is this treated? Treatment will depend on how bad your symptoms are. Your doctor may suggest:  Changes to your diet.  Medicine.  Surgery. Follow these instructions at home: Eating and drinking   Follow a diet as told by your doctor. You may need to avoid foods and drinks such as: ? Coffee and tea (with  or without caffeine). ? Drinks that contain alcohol. ? Energy drinks and sports drinks. ? Bubbly (carbonated) drinks or sodas. ? Chocolate and cocoa. ? Peppermint and mint flavorings. ? Garlic and onions. ? Horseradish. ? Spicy and acidic foods. These include peppers, chili powder, curry powder, vinegar, hot sauces, and BBQ sauce. ? Citrus fruit juices and citrus fruits, such as oranges, lemons, and limes. ? Tomato-based foods. These include red sauce, chili, salsa, and pizza with red sauce. ? Fried and fatty foods. These include donuts, french fries, potato chips, and high-fat dressings. ? High-fat meats. These include hot dogs, rib eye steak, sausage, ham, and bacon. ? High-fat dairy items, such as whole milk, butter, and cream cheese.  Eat small meals often. Avoid eating large meals.  Avoid drinking large amounts of liquid with your meals.  Avoid eating meals during the 2-3 hours before bedtime.  Avoid lying down right  after you eat.  Do not exercise right after you eat. Lifestyle   Do not use any products that contain nicotine or tobacco. These include cigarettes, e-cigarettes, and chewing tobacco. If you need help quitting, ask your doctor.  Try to lower your stress. If you need help doing this, ask your doctor.  If you are overweight, lose an amount of weight that is healthy for you. Ask your doctor about a safe weight loss goal. General instructions  Pay attention to any changes in your symptoms.  Take over-the-counter and prescription medicines only as told by your doctor. Do not take aspirin, ibuprofen, or other NSAIDs unless your doctor says it is okay.  Wear loose clothes. Do not wear anything tight around your waist.  Raise (elevate) the head of your bed about 6 inches (15 cm).  Avoid bending over if this makes your symptoms worse.  Keep all follow-up visits as told by your doctor. This is important. Contact a doctor if:  You have new symptoms.  You lose weight and you do not know why.  You have trouble swallowing or it hurts to swallow.  You have wheezing or a cough that keeps happening.  Your symptoms do not get better with treatment.  You have a hoarse voice. Get help right away if:  You have pain in your arms, neck, jaw, teeth, or back.  You feel sweaty, dizzy, or light-headed.  You have chest pain or shortness of breath.  You throw up (vomit) and your throw-up looks like blood or coffee grounds.  You pass out (faint).  Your poop (stool) is bloody or black.  You cannot swallow, drink, or eat. Summary  If a person has gastroesophageal reflux disease (GERD), food and stomach acid move back up into the esophagus and cause symptoms or problems such as damage to the esophagus.  Treatment will depend on how bad your symptoms are.  Follow a diet as told by your doctor.  Take all medicines only as told by your doctor. This information is not intended to replace  advice given to you by your health care provider. Make sure you discuss any questions you have with your health care provider. Document Revised: 06/02/2018 Document Reviewed: 06/02/2018 Elsevier Patient Education  2020 [place] After These instructions provide you with information about caring for yourself after your procedure. Your health care provider may also give you more specific instructions. Your treatment has been planned according to current medical practices, but problems sometimes occur. Call your health care provider if you have any problems  or questions after your procedure. What can I expect after the procedure? After your procedure, you may:  Feel sleepy for several hours.  Feel clumsy and have poor balance for several hours.  Feel forgetful about what happened after the procedure.  Have poor judgment for several hours.  Feel nauseous or vomit.  Have a sore throat if you had a breathing tube during the procedure. Follow these instructions at home: For at least 24 hours after the procedure:      Have a responsible adult stay with you. It is important to have someone help care for you until you are awake and alert.  Rest as needed.  Do not: ? Participate in activities in which you could fall or become injured. ? Drive. ? Use heavy machinery. ? Drink alcohol. ? Take sleeping pills or medicines that cause drowsiness. ? Make important decisions or sign legal documents. ? Take care of children on your own. Eating and drinking  Follow the diet that is recommended by your health care provider.  If you vomit, drink water, juice, or soup when you can drink without vomiting.  Make sure you have little or no nausea before eating solid foods. General instructions  Take over-the-counter and prescription medicines only as told by your health care provider.  If you have sleep apnea, surgery and certain medicines can increase  your risk for breathing problems. Follow instructions from your health care provider about wearing your sleep device: ? Anytime you are sleeping, including during daytime naps. ? While taking prescription pain medicines, sleeping medicines, or medicines that make you drowsy.  If you smoke, do not smoke without supervision.  Keep all follow-up visits as told by your health care provider. This is important. Contact a health care provider if:  You keep feeling nauseous or you keep vomiting.  You feel light-headed.  You develop a rash.  You have a fever. Get help right away if:  You have trouble breathing. Summary  For several hours after your procedure, you may feel sleepy and have poor judgment.  Have a responsible adult stay with you for at least 24 hours or until you are awake and alert. This information is not intended to replace advice given to you by your health care provider. Make sure you discuss any questions you have with your health care provider. Document Revised: 02/22/2018 Document Reviewed: 03/16/2016 Elsevier Patient Education  [place].

## 2020-02-09 NOTE — Transfer of Care (Signed)
Immediate Anesthesia Transfer of Care Note  Patient: Paul Arias  Procedure(s) Performed: COLONOSCOPY WITH PROPOFOL (N/A ) ESOPHAGOGASTRODUODENOSCOPY (EGD) WITH PROPOFOL (N/A ) BIOPSY POLYPECTOMY  Patient Location: PACU  Anesthesia Type:General  Level of Consciousness: awake, alert  and patient cooperative  Airway & Oxygen Therapy: Patient Spontanous Breathing  Post-op Assessment: Report given to RN and Post -op Vital signs reviewed and stable  Post vital signs: Reviewed and stable  Last Vitals:  Vitals Value Taken Time  BP    Temp 97.5   Pulse    Resp    SpO2      Last Pain:  Vitals:   02/09/20 1201  TempSrc: Oral  PainSc: 3          Complications: No apparent anesthesia complications

## 2020-02-09 NOTE — Anesthesia Preprocedure Evaluation (Addendum)
Anesthesia Evaluation  Patient identified by MRN, date of birth, ID band Patient awake    Reviewed: Allergy & Precautions, NPO status , Patient's Chart, lab work & pertinent test results  Airway Mallampati: II       Dental  (+) Poor Dentition   Pulmonary COPD, Patient abstained from smoking., former smoker,    Pulmonary exam normal breath sounds clear to auscultation       Cardiovascular Exercise Tolerance: Good  Rhythm:Regular Rate:Normal     Neuro/Psych negative psych ROS   GI/Hepatic PUD, GERD  Medicated and Controlled,(+)     substance abuse  alcohol use,   Endo/Other  negative endocrine ROS  Renal/GU negative Renal ROS  negative genitourinary   Musculoskeletal negative musculoskeletal ROS (+)   Abdominal   Peds  Hematology  (+) anemia ,   Anesthesia Other Findings   Reproductive/Obstetrics negative OB ROS                           Anesthesia Physical Anesthesia Plan  ASA: III  Anesthesia Plan: General   Post-op Pain Management:    Induction: Intravenous  PONV Risk Score and Plan: TIVA  Airway Management Planned: Nasal Cannula and Simple Face Mask  Additional Equipment:   Intra-op Plan:   Post-operative Plan:   Informed Consent: I have reviewed the patients History and Physical, chart, labs and discussed the procedure including the risks, benefits and alternatives for the proposed anesthesia with the patient or authorized representative who has indicated his/her understanding and acceptance.     Dental advisory given  Plan Discussed with: CRNA  Anesthesia Plan Comments:        Anesthesia Quick Evaluation

## 2020-02-09 NOTE — Op Note (Signed)
[facility] Patient Name: Paul Arias Procedure Date: 02/09/2020 1:59 PM MRN: [id number] Date of Birth: [birth year] Attending MD: Norvel Richards , MD CSN: [id number] Age: 54 Admit Type: Outpatient Procedure:                Colonoscopy Indications:              High risk colon cancer surveillance: Personal                            history of colonic polyps Providers:                Norvel Richards, MD, Janeece Riggers, RN, Nelma Rothman, Technician Referring MD:              Medicines:                Propofol per Anesthesia Complications:            No immediate complications. Estimated Blood Loss:     Estimated blood loss was minimal. Procedure:                Pre-Anesthesia Assessment:                           - Prior to the procedure, a History and Physical                            was performed, and patient medications and                            allergies were reviewed. The patient's tolerance of                            previous anesthesia was also reviewed. The risks                            and benefits of the procedure and the sedation                            options and risks were discussed with the patient.                            All questions were answered, and informed consent                            was obtained. Prior Anticoagulants: The patient has                            taken no previous anticoagulant or antiplatelet                            agents. ASA Grade Assessment: III - A patient with  severe systemic disease. After reviewing the risks                            and benefits, the patient was deemed in                            satisfactory condition to undergo the procedure.                           After obtaining informed consent, the colonoscope                            was passed under direct vision. Throughout the                            procedure, the  patient's blood pressure, pulse, and                            oxygen saturations were monitored continuously. The                            [id number]) scope was introduced through                            the anus and advanced to the the cecum, identified                            by appendiceal orifice and ileocecal valve. The                            colonoscopy was performed without difficulty. The                            patient tolerated the procedure well. The quality                            of the bowel preparation was adequate. Scope In: 2:07:21 PM Scope Out: 2:27:12 PM Scope Withdrawal Time: 0 hours 15 minutes 43 seconds  Total Procedure Duration: 0 hours 19 minutes 51 seconds  Findings:      The perianal and digital rectal examinations were normal.      Seven sessile polyps were found in the rectum, descending colon and       ascending colon. The polyps were 4 to 7 mm in size. These polyps were       removed with a cold snare. Resection and retrieval were complete.       Estimated blood loss was minimal.      The exam was otherwise without abnormality on direct and retroflexion       views. Impression:               - Seven 4 to 7 mm polyps in the rectum, in the                            descending colon and in the ascending colon,  removed with a cold snare. Resected and retrieved.                           - The examination was otherwise normal on direct                            and retroflexion views. Moderate Sedation:      Moderate (conscious) sedation was personally administered by an       anesthesia professional. The following parameters were monitored: oxygen       saturation, heart rate, blood pressure, respiratory rate, EKG, adequacy       of pulmonary ventilation, and response to care. Recommendation:           - Patient has a contact number available for                            emergencies. The signs and  symptoms of potential                            delayed complications were discussed with the                            patient. Return to normal activities tomorrow.                            Written discharge instructions were provided to the                            patient.                           - Advance diet as tolerated.                           - Continue present medications.                           - Repeat colonoscopy date to be determined after                            pending pathology results are reviewed for                            surveillance based on pathology results.                           - Return to GI office (date not yet determined).                            See EGD report. Procedure Code(s):        --- Professional ---                           [phone], Colonoscopy, flexible; with removal of  tumor(s), polyp(s), or other lesion(s) by snare                            technique Diagnosis Code(s):        --- Professional ---                           Z86.010, Personal history of colonic polyps                           K62.1, Rectal polyp                           K63.5, Polyp of colon CPT copyright 2019 American Medical Association. All rights reserved. The codes documented in this report are preliminary and upon coder review may  be revised to meet current compliance requirements. [name]. Kristof Nadeem, MD Norvel Richards, MD 02/09/2020 2:37:47 PM This report has been signed electronically. Number of Addenda: 0

## 2020-02-13 ENCOUNTER — Encounter: Payer: Self-pay | Admitting: Internal Medicine

## 2020-02-13 LAB — SURGICAL PATHOLOGY

## 2020-02-20 ENCOUNTER — Other Ambulatory Visit: Payer: Self-pay

## 2020-02-20 DIAGNOSIS — D132 Benign neoplasm of duodenum: Secondary | ICD-10-CM

## 2020-02-23 ENCOUNTER — Telehealth: Payer: Self-pay | Admitting: Internal Medicine

## 2020-02-23 NOTE — Telephone Encounter (Signed)
Referral was sent to Dr. Rush Landmark 02/20/20. Per referral message, records have been forwarded to Dr. Rush Landmark for review but he is off this week and will review when he returns.  Tried to call pt, LMOVM to inform him.

## 2020-02-23 NOTE — Telephone Encounter (Signed)
[phone] patient called and stated rmr told him after his procedure that we would be referring him to a specialist,  He has not heard anything yet and is concerned

## 2020-02-24 ENCOUNTER — Telehealth: Payer: Self-pay | Admitting: Internal Medicine

## 2020-02-24 NOTE — Telephone Encounter (Signed)
Pt was calling inquiring about his results. [phone]

## 2020-02-24 NOTE — Telephone Encounter (Signed)
Spoke with pt. Pt was notified of his results for his EGD and TCS previously. Pt is asking to know specifics about his esophagus. Pt states RMR explained that he is being referred to a doctor due to something continuing to grow in his small intestine. Pt said he didn't recall hearing anything about his esophagus. Please advise.

## 2020-02-24 NOTE — Telephone Encounter (Signed)
big issue now is growth in small intestine.  He has Barrett's esophagus unchanged from 4 years ago. We will recommend a repeat EGD for Barrett's in 3 years.

## 2020-02-27 ENCOUNTER — Telehealth: Payer: Self-pay

## 2020-02-27 NOTE — Telephone Encounter (Signed)
Pt returned call. Pt notified of RMR's recommendations.

## 2020-02-27 NOTE — Telephone Encounter (Signed)
Reminder in epic °

## 2020-02-27 NOTE — Telephone Encounter (Signed)
Please see note below and schedule pt with Dr. Jannifer Rodney   Please move forward with scheduling Clinic visit and then we will proceed with scheduling EMR needs.  Please let Dr. Gala Romney and I know about the timing of his clinic visit.  Thank you.    GM

## 2020-02-27 NOTE — Telephone Encounter (Signed)
Lmom, waiting on a return call.  

## 2020-02-28 ENCOUNTER — Encounter: Payer: Self-pay | Admitting: Gastroenterology

## 2020-02-28 ENCOUNTER — Ambulatory Visit: Payer: 59 | Admitting: Cardiology

## 2020-02-28 NOTE — Telephone Encounter (Signed)
Consult scheduled on 04/06/20 at 3:30pm.

## 2020-02-29 ENCOUNTER — Telehealth: Payer: Self-pay | Admitting: Internal Medicine

## 2020-02-29 NOTE — Telephone Encounter (Signed)
Spoke with pts sister. She is aware of the results of pts recent exams. Pt will call back if needed.

## 2020-02-29 NOTE — Telephone Encounter (Signed)
Pt said he received a letter about his colonoscopy results, but nothing regarding his EGD results. I told him that he is recommended to repeat EGD in 3 yrs. Pt said he wants the nurse to call his sister, Delorise Royals, and explain results to her. He did sign a release of information to speak with her. Her number is [phone] and patient's number in [phone]

## 2020-03-06 ENCOUNTER — Telehealth: Payer: Self-pay | Admitting: Internal Medicine

## 2020-03-06 NOTE — Telephone Encounter (Signed)
Pt called to say we had referred him and they can't see him until the end of April and was there somewhere else we could refer him to that could see him sooner. [phone]

## 2020-03-06 NOTE — Telephone Encounter (Signed)
Patient is scheduled with [facility] 04/06/2020. Patient wanted to know if he could get in sooner somewhere else. I advised [facility] but they are scheduling about the same time out. Patient wants to know if Dr. Gala Romney is okay with this. Please advise Dr. Gala Romney thanks

## 2020-03-07 ENCOUNTER — Telehealth: Payer: Self-pay

## 2020-03-07 ENCOUNTER — Encounter: Payer: Self-pay | Admitting: Cardiology

## 2020-03-07 ENCOUNTER — Other Ambulatory Visit: Payer: Self-pay

## 2020-03-07 ENCOUNTER — Ambulatory Visit (INDEPENDENT_AMBULATORY_CARE_PROVIDER_SITE_OTHER): Payer: 59 | Admitting: Cardiology

## 2020-03-07 VITALS — BP 138/82 | HR 69 | Ht 75.0 in | Wt 192.0 lb

## 2020-03-07 DIAGNOSIS — I2510 Atherosclerotic heart disease of native coronary artery without angina pectoris: Secondary | ICD-10-CM

## 2020-03-07 DIAGNOSIS — Z8249 Family history of ischemic heart disease and other diseases of the circulatory system: Secondary | ICD-10-CM

## 2020-03-07 DIAGNOSIS — E782 Mixed hyperlipidemia: Secondary | ICD-10-CM

## 2020-03-07 DIAGNOSIS — R0600 Dyspnea, unspecified: Secondary | ICD-10-CM

## 2020-03-07 DIAGNOSIS — I208 Other forms of angina pectoris: Secondary | ICD-10-CM

## 2020-03-07 DIAGNOSIS — R0609 Other forms of dyspnea: Secondary | ICD-10-CM

## 2020-03-07 MED ORDER — METOPROLOL TARTRATE 100 MG PO TABS: ORAL_TABLET | ORAL | 0 refills | Status: DC

## 2020-03-07 NOTE — Patient Instructions (Addendum)
Your cardiac CT will be scheduled at one of the below locations:   [facility] [address] [place], [place] [address] [phone]  [facility] [address] [place], [place] [address] [phone]  If scheduled at [facility], please arrive at the [facility] main entrance of [facility] 30 minutes prior to test start time. Proceed to the [facility] Radiology Department (first floor) to check-in and test prep.  If scheduled at [facility], please arrive 15 mins early for check-in and test prep.  Please follow these instructions carefully (unless otherwise directed):  Hold all erectile dysfunction medications at least 3 days (72 hrs) prior to test.  On the Night Before the Test: . Be sure to Drink plenty of water. . Do not consume any caffeinated/decaffeinated beverages or chocolate 12 hours prior to your test. . Do not take any antihistamines 12 hours prior to your test. . If you take Metformin do not take 24 hours prior to test. .  On the Day of the Test: . Drink plenty of water. Do not drink any water within one hour of the test. . Do not eat any food 4 hours prior to the test. . You may take your regular medications prior to the test.  . Take metoprolol (Lopressor) two hours prior to test. . HOLD Furosemide/Hydrochlorothiazide morning of the test. .        After the Test: . Drink plenty of water. . After receiving IV contrast, you may experience a mild flushed feeling. This is normal. . On occasion, you may experience a mild rash up to 24 hours after the test. This is not dangerous. If this occurs, you can take Benadryl 25 mg and increase your fluid intake. . If you experience trouble breathing, this can be serious. If it is severe call 911 IMMEDIATELY. If it is mild, please call our office. . If you take any of these medications: Glipizide/Metformin, Avandament,  Glucavance, please do not take 48 hours after completing test unless otherwise instructed.   Once we have confirmed authorization from your insurance company, we will call you to set up a date and time for your test.   For non-scheduling related questions, please contact the cardiac imaging nurse navigator should you have any questions/concerns: Marchia Bond, RN Navigator Cardiac Imaging [facility] [phone] office  For scheduling needs, including cancellations and rescheduling, please call [phone].

## 2020-03-07 NOTE — Telephone Encounter (Signed)
Attempt to reach sister at [phone], line just rings

## 2020-03-07 NOTE — Progress Notes (Signed)
Cardiology Office Note  Date: 03/07/2020   ID: Paul Arias, [name] 04/21/1966, MRN [id number]  PCP:  Lemmie Evens, MD  Consulting Cardiologist:  Rozann Lesches, MD Electrophysiologist:  None   Chief Complaint  Patient presents with  . Cardiac evaluation    History of Present Illness: Paul Arias is a 54 y.o. male referred for cardiology consultation by Ms. Ruthann Cancer NP with Dr. Karie Kirks due to family history of heart disease.  I reviewed the available records.  He reports a family history of premature CAD, both parents and also sibling.  He has a history of hyperlipidemia, Lipitor was recently increased to 80 mg daily in light of LDL 147.  He does not have any standing history of hypertension or type 2 diabetes mellitus.  In terms of symptomatology, he reports dyspnea on exertion, he attributes this to COPD.  Screening chest CT noted in October 2020 is reviewed below.  I discussed the results with him today which incidentally mentioned coronary artery calcification and also aortic atherosclerosis.  He states that is not able to take aspirin at this point with prior history of GI bleed, gastric and colonic polyps previously documented.  He works for a [employer].  I personally reviewed his ECG today which shows normal sinus rhythm.  Past Medical History:  Diagnosis Date  . Alcohol dependence in remission ([place])   . Allergic rhinitis   . Bipolar disorder ([place])   . Chronic pain   . Colon polyp    Tubular adenoma  . COPD (chronic obstructive pulmonary disease) ([place])   . Gastric polyp    Tubular adenoma  . GERD (gastroesophageal reflux disease)   . Mixed hyperlipidemia     Past Surgical History:  Procedure Laterality Date  . BIOPSY  02/09/2020   Procedure: BIOPSY;  Surgeon: Daneil Dolin, MD;  [facility];  Service: Endoscopy;;  . COLONOSCOPY N/A 08/19/2016   Dr. Aviva Signs: 2 semi-pedunculated polyps in the distal sigmoid colon removed.  Measure 2-5 mm. Tubular adenomas.  . COLONOSCOPY WITH PROPOFOL N/A 02/09/2020   Procedure: COLONOSCOPY WITH PROPOFOL;  Surgeon: Daneil Dolin, MD;  [facility];  Service: Endoscopy;  Laterality: N/A;  1:00pm  . ESOPHAGOGASTRODUODENOSCOPY N/A 08/19/2016   Dr. Arnoldo Morale: Villous appearing mucosa in the first portion of the duodenum, polyp removed with hot snare, pathology tubular adenoma. Medium sized hiatal hernia. Biopsy for CLOtest which was negative.  . ESOPHAGOGASTRODUODENOSCOPY (EGD) WITH PROPOFOL  08/20/2016   Dr. Gala Romney: assisted Dr. Arnoldo Morale emergently. Esophagus normal. Medium sized hiatal hernia. One nonbleeding cratered stellate-shaped gastric ulcer measuring 8 mm. One cratered ulcerated area with adherent clot in the second portion of the duodenum. 25 mm in diameter. Visible vessel seen. Status post bleeding control therapy. Heaped up margins consistent with incomplete removal.   . ESOPHAGOGASTRODUODENOSCOPY (EGD) WITH PROPOFOL N/A 10/27/2016   PROPOFOL;  Surgeon: Daneil Dolin, MD; Barrett's esophagus without dysplasia or malignancy.  Normal stomach with previous gastric ulcer healed.  2 duodenal polyps revealing fragments of small bowel adenoma, otherwise normal.  Recommendations to repeat endoscopy in 1 year.  . ESOPHAGOGASTRODUODENOSCOPY (EGD) WITH PROPOFOL N/A 02/09/2020   Procedure: ESOPHAGOGASTRODUODENOSCOPY (EGD) WITH PROPOFOL;  Surgeon: Daneil Dolin, MD;  [facility];  Service: Endoscopy;  Laterality: N/A;  . FLEXIBLE SIGMOIDOSCOPY N/A 08/20/2016   Dr. Arnoldo Morale: Hematin found in mid sigmoid colon. One hemostatic clip placed.  Marland Kitchen POLYPECTOMY  02/09/2020   Procedure: POLYPECTOMY;  Surgeon: Gala Romney,  [name], MD;  [facility];  Service: Endoscopy;;    Current Outpatient Medications  Medication Sig Dispense Refill  . ALPRAZolam (XANAX) 1 MG tablet Take 2 mg by mouth at bedtime.     Marland Kitchen atorvastatin (LIPITOR) 40 MG tablet Take 40 mg by mouth daily at 6 PM.      . Biotin 10000 MCG TABS Take 1 tablet by mouth daily.    . Calcium Carb-Cholecalciferol (CALCIUM 600/VITAMIN D3) 600-800 MG-UNIT TABS Take 1 tablet by mouth daily.    . cetirizine (ZYRTEC) 10 MG tablet Take 10 mg by mouth daily.    . cholecalciferol (VITAMIN D3) 25 MCG (1000 UT) tablet Take 1,000 Units by mouth daily.    . fenofibrate 160 MG tablet Take 160 mg by mouth daily.    Marland Kitchen HYDROcodone-acetaminophen (NORCO) 10-325 MG tablet Take 1 tablet by mouth every 6 (six) hours as needed (for pain).     . magnesium oxide (MAG-OX) 400 MG tablet Take 400 mg by mouth daily.    Marland Kitchen omeprazole (PRILOSEC) 20 MG capsule Take 20 mg by mouth daily.    . vitamin B-12 (CYANOCOBALAMIN) 500 MCG tablet Take 500 mcg by mouth daily.    . Zinc 50 MG TABS Take 50 mg by mouth daily.     . metoprolol tartrate (LOPRESSOR) 100 MG tablet Take 100 mg 2 hours before Cardiac CT 1 tablet 0   No current facility-administered medications for this visit.   Allergies:  Patient has no known allergies.   Social History: The patient  reports that he quit smoking about 5 months ago. His smoking use included cigarettes. He has a 10.00 pack-year smoking history. He has never used smokeless tobacco. He reports current alcohol use. He reports that he does not use drugs.  Family History: The patient's family history includes CAD in his mother; Cirrhosis in his mother; Esophageal cancer in his brother; Heart disease in his father.   ROS:   No palpitations, dizziness, syncope.  No claudication.  Physical Exam: VS:  BP 138/82   Pulse 69   Ht 6\' 3"  (1.905 m)   Wt 192 lb (87.1 kg)   SpO2 94%   BMI 24.00 kg/m , BMI Body mass index is 24 kg/m.  Wt Readings from Last 3 Encounters:  03/07/20 192 lb (87.1 kg)  02/09/20 197 lb 15.6 oz (89.8 kg)  02/07/20 198 lb (89.8 kg)    General: Patient appears comfortable at rest. HEENT: Conjunctiva and lids normal, wearing a mask. Neck: Supple, no elevated JVP or carotid bruits, no  thyromegaly. Lungs: Clear to auscultation, nonlabored breathing at rest. Cardiac: Regular rate and rhythm, no S3 or significant systolic murmur, no pericardial rub. Abdomen: Soft, nontender, bowel sounds present. Extremities: No pitting edema, distal pulses 2+. Skin: Warm and dry. Musculoskeletal: No kyphosis. Neuropsychiatric: Alert and oriented x3, affect grossly appropriate.  ECG:  An ECG dated 10/24/2016 was personally reviewed today and demonstrated:  Normal sinus rhythm.  Recent Labwork:  February 2021: Cholesterol 236, HDL 50, triglycerides 253, LDL 147, BUN 13, creatinine 0.96, potassium 4.3, AST 24, ALT 25, hemoglobin 13.9, platelets 293  Other Studies Reviewed Today:  Chest CT 09/19/2019: FINDINGS: Cardiovascular: The heart size is normal. No substantial pericardial effusion. Coronary artery calcification is evident. Atherosclerotic calcification is noted in the wall of the thoracic aorta.  Mediastinum/Nodes: No mediastinal lymphadenopathy. No evidence for gross hilar lymphadenopathy although assessment is limited by the lack of intravenous contrast on today's study. The esophagus has  normal imaging features. There is no axillary lymphadenopathy.  Lungs/Pleura: Centrilobular and paraseptal emphysema evident. Scattered bilateral pulmonary nodules are identified measuring up to maximum volume derived equivalent diameter of 4.7 mm. No overtly suspicious nodule or mass. No focal consolidation. No pleural effusion.  Upper Abdomen: Unremarkable  Musculoskeletal: No worrisome lytic or sclerotic osseous abnormality.  IMPRESSION: 1. Lung-RADS 2, benign appearance or behavior. Continue annual screening with low-dose chest CT without contrast in 12 months. 2. Coronary artery and thoracic aortic atherosclerosis. 3. Emphysema.  Aortic Atherosclerosis (ICD10-I70.0) and Emphysema (ICD10-J43.9).  Assessment and Plan:  1.  Dyspnea on exertion in a 54 year old male with  family history of premature CAD, hyperlipidemia, and coronary artery calcification by screening CT imaging in October 2020.  He also has some component of emphysema with tobacco abuse in remission.  I reviewed his baseline ECG.  Plan is to proceed with a cardiac CTA for definition of coronary anatomy.  In the meanwhile agree with increase in Lipitor to 80 mg daily.  Not starting aspirin given patient discussion today, prior history of GI bleeding.  2.  Mixed hyperlipidemia, recent lab work showed LDL 147 and HDL 50.  Lipitor was increased to 80 mg daily recently by PCP.  Goal LDL should be under 70 with documented atherosclerosis.  Medication Adjustments/Labs and Tests Ordered: Current medicines are reviewed at length with the patient today.  Concerns regarding medicines are outlined above.   Tests Ordered: Orders Placed This Encounter  Procedures  . CT CORONARY MORPH W/CTA COR W/SCORE W/CA W/CM &/OR WO/CM  . CT CORONARY FRACTIONAL FLOW RESERVE DATA PREP  . CT CORONARY FRACTIONAL FLOW RESERVE FLUID ANALYSIS  . EKG 12-Lead    Medication Changes: Meds ordered this encounter  Medications  . metoprolol tartrate (LOPRESSOR) 100 MG tablet    Sig: Take 100 mg 2 hours before Cardiac CT    Dispense:  1 tablet    Refill:  0    Disposition:  Follow up test results.  Signed, Satira Sark, MD, [facility] 03/07/2020 10:52 AM    [place] at [facility] [address], [place], [place] [address] Phone: [phone]; Fax: [phone]

## 2020-03-07 NOTE — Addendum Note (Signed)
Addended by: Barbarann Ehlers A on: 03/07/2020 10:53 AM   Modules accepted: Orders

## 2020-03-07 NOTE — Telephone Encounter (Signed)
Per Pt he was unable to answer family history questions today at his appt. Per Pt please speak to Sister(Darnell Geralynn Ochs). She will give family history.  Please call Delorise Royals [phone]   Thanks renee

## 2020-03-08 NOTE — Telephone Encounter (Signed)
Spoke with a man at number listed, states no one by name of Mariella Saa knox lives there. Patient's history is already in chart. I will close this call.

## 2020-03-20 ENCOUNTER — Telehealth: Payer: Self-pay | Admitting: Cardiology

## 2020-03-20 NOTE — Telephone Encounter (Signed)
Please give pt's sister Mariella Saa a call @ [phone] concerning the pt's medical hx

## 2020-03-21 NOTE — Telephone Encounter (Signed)
Please call pt's sister on [phone]

## 2020-03-21 NOTE — Telephone Encounter (Signed)
Pt's sister wanted to be sure we have an accurate family history which includes her father passing away @ age of 101 in his sleep with a massive heart attack. He had a total of 3 heart attacks. The pt's mother had a heart attack requiring bypass (3 vessels) but later passed away from cirrhosis of liver. She also had a pacemaker. The pt's brother had  A heart attack in his sleep passing away @ age 56. Pt also has a sister that had a heart attack @ age 89 and had to have stents placed.  Pt's sister is much older than him and he was not aware of all medical history but wanted to be sure Dr. Domenic Polite was aware. Looking in pt's chart he did state the family history of heart disease.

## 2020-03-21 NOTE — Telephone Encounter (Signed)
Thank you.  Could you please update the family history section with that information.

## 2020-03-21 NOTE — Telephone Encounter (Signed)
Returned call. No answer. Left message for return call.

## 2020-03-26 ENCOUNTER — Other Ambulatory Visit (HOSPITAL_COMMUNITY)
Admission: RE | Admit: 2020-03-26 | Discharge: 2020-03-26 | Disposition: A | Discharge status: Home / Self Care | Payer: 59 | Source: Ambulatory Visit | Attending: Cardiology | Admitting: Cardiology

## 2020-03-26 DIAGNOSIS — E782 Mixed hyperlipidemia: Secondary | ICD-10-CM | POA: Diagnosis present

## 2020-03-26 DIAGNOSIS — I2510 Atherosclerotic heart disease of native coronary artery without angina pectoris: Secondary | ICD-10-CM | POA: Insufficient documentation

## 2020-03-26 DIAGNOSIS — R0600 Dyspnea, unspecified: Secondary | ICD-10-CM | POA: Insufficient documentation

## 2020-03-26 LAB — BASIC METABOLIC PANEL
Anion gap: 9 (ref 5–15)
BUN: 14 mg/dL (ref 6–20)
CO2: 26 mmol/L (ref 22–32)
Calcium: 9.4 mg/dL (ref 8.9–10.3)
Chloride: 104 mmol/L (ref 98–111)
Creatinine, Ser: 1.03 mg/dL (ref 0.61–1.24)
GFR calc Af Amer: >60 mL/min (ref >60)
GFR calc non Af Amer: >60 mL/min (ref >60)
Glucose, Bld: 100 mg/dL — ABNORMAL HIGH (ref 70–99)
Potassium: 3.8 mmol/L (ref 3.5–5.1)
Sodium: 139 mmol/L (ref 135–145)

## 2020-03-28 ENCOUNTER — Telehealth (HOSPITAL_COMMUNITY): Payer: Self-pay | Admitting: Emergency Medicine

## 2020-03-28 NOTE — Telephone Encounter (Signed)
Reaching out to patient to offer assistance regarding upcoming cardiac imaging study; pt verbalizes understanding of appt date/time, parking situation and where to check in, pre-test NPO status and medications ordered, and verified current allergies; name and call back number provided for further questions should they arise Stepehn Eckard RN Navigator Cardiac Imaging [facility] [phone] office [phone] cell 

## 2020-03-29 ENCOUNTER — Other Ambulatory Visit: Payer: Self-pay

## 2020-03-29 ENCOUNTER — Ambulatory Visit
Admission: RE | Admit: 2020-03-29 | Discharge: 2020-03-29 | Disposition: A | Discharge status: Home / Self Care | Payer: 59 | Source: Ambulatory Visit | Attending: Cardiology | Admitting: Cardiology

## 2020-03-29 DIAGNOSIS — I208 Other forms of angina pectoris: Secondary | ICD-10-CM | POA: Insufficient documentation

## 2020-03-29 MED ORDER — METOPROLOL TARTRATE 5 MG/5ML IV SOLN
5.0000 mg | INTRAVENOUS | Status: DC | PRN
  Administered 2020-03-29: 5 mg via INTRAVENOUS

## 2020-03-29 MED ORDER — IOHEXOL 350 MG/ML SOLN
100.0000 mL | Freq: Once | INTRAVENOUS | Status: AC | PRN
  Administered 2020-03-29: 100 mL via INTRAVENOUS

## 2020-03-29 MED ORDER — NITROGLYCERIN 0.4 MG SL SUBL
0.8000 mg | SUBLINGUAL_TABLET | Freq: Once | SUBLINGUAL | Status: AC
  Administered 2020-03-29: 0.8 mg via SUBLINGUAL

## 2020-03-29 NOTE — Progress Notes (Signed)
Patient tolerated CT well. Drank water and coffee after. Ambulated to exit steady gait.

## 2020-03-30 ENCOUNTER — Telehealth: Payer: Self-pay

## 2020-03-30 NOTE — H&P (View-Only) (Signed)
Results reviewed.  No acute extracardiac findings on CT.  High calcium score of 1740 with evidence of significant stenoses involving the RCA and LAD with at least moderate stenosis in the circumflex.  FFR measurements are pending, but I suspect that he will need a cardiac catheterization anyway based on these findings.  Please schedule an office visit next week with [place] in [place] to discuss getting a cardiac catheterization set up.  Question will have to be resolved as to whether he can take any form of antiplatelet therapy with prior history of GI bleeding.  He will have to have dual antiplatelet therapy if stent intervention is pursued.

## 2020-03-30 NOTE — Telephone Encounter (Signed)
Pt's Sister is calling for test results. Pt has asked her to have results explained to her.  Please call Mariella Saa [phone]  Thanks renee

## 2020-03-30 NOTE — Progress Notes (Signed)
Results reviewed.  No acute extracardiac findings on CT.  High calcium score of 1740 with evidence of significant stenoses involving the RCA and LAD with at least moderate stenosis in the circumflex.  FFR measurements are pending, but I suspect that he will need a cardiac catheterization anyway based on these findings.  Please schedule an office visit next week with [facility] in [place] to discuss getting a cardiac catheterization set up.  Question will have to be resolved as to whether he can take any form of antiplatelet therapy with prior history of GI bleeding.  He will have to have dual antiplatelet therapy if stent intervention is pursued.

## 2020-03-30 NOTE — Telephone Encounter (Signed)
Pt is returning call from [facility] concerning apt next week

## 2020-03-30 NOTE — Telephone Encounter (Signed)
Returned call to pt's sister. Went over results.

## 2020-04-02 NOTE — Telephone Encounter (Signed)
I spoke with patient, his sister had already spoke with staff.

## 2020-04-04 ENCOUNTER — Ambulatory Visit: Payer: 59 | Attending: Internal Medicine

## 2020-04-04 ENCOUNTER — Telehealth: Payer: Self-pay

## 2020-04-04 ENCOUNTER — Other Ambulatory Visit: Payer: Self-pay

## 2020-04-04 DIAGNOSIS — Z20822 Contact with and (suspected) exposure to covid-19: Secondary | ICD-10-CM

## 2020-04-04 NOTE — Telephone Encounter (Signed)
Pt has appt Friday 04-06-20 to discuss CATH. Pt has been exposed x2 contacts. Per Pt please advise  Please call [phone]  Thanks renee

## 2020-04-05 ENCOUNTER — Telehealth: Payer: Self-pay | Admitting: *Deleted

## 2020-04-05 LAB — SARS-COV-2, NAA 2 DAY TAT

## 2020-04-05 LAB — NOVEL CORONAVIRUS, NAA: SARS-CoV-2, NAA: NOT DETECTED

## 2020-04-05 NOTE — Telephone Encounter (Signed)
He called in requesting the result of his COVID-19 test.   I let him know it was still in process. He thanked me for my help.

## 2020-04-06 ENCOUNTER — Encounter (HOSPITAL_COMMUNITY): Payer: Self-pay | Admitting: Cardiovascular Disease

## 2020-04-06 ENCOUNTER — Encounter: Payer: Self-pay | Admitting: Gastroenterology

## 2020-04-06 ENCOUNTER — Other Ambulatory Visit: Payer: Self-pay | Admitting: Physician Assistant

## 2020-04-06 ENCOUNTER — Other Ambulatory Visit: Payer: Self-pay

## 2020-04-06 ENCOUNTER — Telehealth: Payer: Self-pay | Admitting: Physician Assistant

## 2020-04-06 ENCOUNTER — Encounter: Payer: Self-pay | Admitting: Physician Assistant

## 2020-04-06 ENCOUNTER — Ambulatory Visit: Payer: 59 | Admitting: Gastroenterology

## 2020-04-06 ENCOUNTER — Other Ambulatory Visit (INDEPENDENT_AMBULATORY_CARE_PROVIDER_SITE_OTHER): Payer: 59

## 2020-04-06 ENCOUNTER — Ambulatory Visit (INDEPENDENT_AMBULATORY_CARE_PROVIDER_SITE_OTHER): Payer: 59 | Admitting: Physician Assistant

## 2020-04-06 VITALS — BP 142/90 | HR 84 | Temp 97.7°F | Ht 75.0 in | Wt 191.5 lb

## 2020-04-06 VITALS — BP 150/86 | HR 76 | Temp 98.7°F | Ht 75.0 in | Wt 193.0 lb

## 2020-04-06 DIAGNOSIS — R931 Abnormal findings on diagnostic imaging of heart and coronary circulation: Secondary | ICD-10-CM

## 2020-04-06 DIAGNOSIS — K3189 Other diseases of stomach and duodenum: Secondary | ICD-10-CM | POA: Diagnosis not present

## 2020-04-06 DIAGNOSIS — D132 Benign neoplasm of duodenum: Secondary | ICD-10-CM | POA: Diagnosis not present

## 2020-04-06 DIAGNOSIS — R933 Abnormal findings on diagnostic imaging of other parts of digestive tract: Secondary | ICD-10-CM

## 2020-04-06 DIAGNOSIS — R0600 Dyspnea, unspecified: Secondary | ICD-10-CM | POA: Diagnosis not present

## 2020-04-06 DIAGNOSIS — R0609 Other forms of dyspnea: Secondary | ICD-10-CM

## 2020-04-06 LAB — CBC
HCT: 39.7 % (ref 39.0–52.0)
Hemoglobin: 13.5 g/dL (ref 13.0–17.0)
MCHC: 33.9 g/dL (ref 30.0–36.0)
MCV: 89.1 fl (ref 78.0–100.0)
Platelets: 300 10*3/uL (ref 150.0–400.0)
RBC: 4.46 Mil/uL (ref 4.22–5.81)
RDW: 13.3 % (ref 11.5–15.5)
WBC: 5 10*3/uL (ref 4.0–10.5)

## 2020-04-06 LAB — PROTIME-INR
INR: 1 ratio (ref 0.8–1.0)
Prothrombin Time: 11 s (ref 9.6–13.1)

## 2020-04-06 MED ORDER — METOPROLOL SUCCINATE ER 50 MG PO TB24
50.0000 mg | ORAL_TABLET | Freq: Every day | ORAL | 11 refills | Status: DC
Start: 2020-04-06 — End: 2020-05-14

## 2020-04-06 MED ORDER — NITROGLYCERIN 0.4 MG SL SUBL
0.4000 mg | SUBLINGUAL_TABLET | SUBLINGUAL | 3 refills | Status: DC | PRN
Start: 2020-04-06 — End: 2022-07-01

## 2020-04-06 NOTE — Telephone Encounter (Signed)
Pt  covid results in Epic noted to be negative. Please advise.

## 2020-04-06 NOTE — Patient Instructions (Signed)
Your provider has requested that you go to the basement level for lab work before leaving today. Press "B" on the elevator. The lab is located at the first door on the left as you exit the elevator.  You have been scheduled for an endoscopy with EMR. Please follow written instructions given to you at your visit today. If you use inhalers (even only as needed), please bring them with you on the day of your procedure.

## 2020-04-06 NOTE — Telephone Encounter (Signed)
He will have to be retested.  Cath Lab aware.  Rosaria Ferries, PA-C 04/06/2020 6:44 PM

## 2020-04-06 NOTE — Progress Notes (Signed)
Cardiology Office Note   Date:  04/06/2020   ID:  Jerimi, Write June 24, 1966, MRN [id number]  PCP:  Lemmie Evens, MD Cardiologist:  Rozann Lesches, MD 03/07/2020, 03/29/2020 Electrphysiologist: None Rosaria Ferries, PA-C   CC: Abnl cardiac CT  History of Present Illness: Paul Arias is a 54 y.o. male with a history of HLD, GERD, COPD, bipolar d/o, tob use, FH CAD, Hx GIB, Colon polyps (7 removed 02/09/2020), Barrett's esophagus (Probable short segment Barrett's esophagus. Biopsied. Small hiatal hernia. Multiple adenomatous appearing duodenal lesions. EGD 02/09/2020).  Paul Arias was seen by Dr Domenic Polite 03/07/2020 for CRFs and DOE>>cardiac CT performed and shows plaque in the prox/ostial RCA (uable to FFR), mLAD 70-99% & CFX 50-69%, but not significant by FFR. Pt to come in to discuss cath.  Paul Arias presents for cardiology follow up.  He has not taken NSAIDS since having bleeding during a colonoscopy.  He is still drinking 3 beers a day.  He has not had any melena, no complications since having polyps removed last month.  He has been referred to a GI doctor in [place], he will see Dr. Rush Landmark later today.  He says he was told he needs another procedure.  He has not had any chest pain.  His dyspnea on exertion is stable.  If he lifts something very heavy and carries it, he will get short of breath.  However, with normal walking, walking up hills or up stairs, he does not feel he gets significantly short of breath.  He is very concerned about the plaque in his arteries and would like to get to the bottom of this.  He does not exercise, but working in [employer], he exerts himself on a regular basis.  No palpitations, no presyncope or syncope.  No lower extremity edema, no orthopnea or PND.  His statin was just increased by his PCP, based on recent labs.  His labs are not available in [place].   Past Medical History:  Diagnosis Date  . Alcohol  dependence in remission ([place])   . Allergic rhinitis   . Bipolar disorder ([place])   . Chronic pain   . Colon polyp    Tubular adenoma  . COPD (chronic obstructive pulmonary disease) ([place])   . Gastric polyp    Tubular adenoma  . GERD (gastroesophageal reflux disease)   . Mixed hyperlipidemia     Past Surgical History:  Procedure Laterality Date  . BIOPSY  02/09/2020   Procedure: BIOPSY;  Surgeon: Daneil Dolin, MD;  [facility];  Service: Endoscopy;;  . COLONOSCOPY N/A 08/19/2016   Dr. Aviva Signs: 2 semi-pedunculated polyps in the distal sigmoid colon removed. Measure 2-5 mm. Tubular adenomas.  . COLONOSCOPY WITH PROPOFOL N/A 02/09/2020   Procedure: COLONOSCOPY WITH PROPOFOL;  Surgeon: Daneil Dolin, MD;  [facility];  Service: Endoscopy;  Laterality: N/A;  1:00pm  . ESOPHAGOGASTRODUODENOSCOPY N/A 08/19/2016   Dr. Arnoldo Morale: Villous appearing mucosa in the first portion of the duodenum, polyp removed with hot snare, pathology tubular adenoma. Medium sized hiatal hernia. Biopsy for CLOtest which was negative.  . ESOPHAGOGASTRODUODENOSCOPY (EGD) WITH PROPOFOL  08/20/2016   Dr. Gala Romney: assisted Dr. Arnoldo Morale emergently. Esophagus normal. Medium sized hiatal hernia. One nonbleeding cratered stellate-shaped gastric ulcer measuring 8 mm. One cratered ulcerated area with adherent clot in the second portion of the duodenum. 25 mm in diameter. Visible vessel seen. Status post bleeding control therapy. Heaped up margins consistent with incomplete removal.   .  ESOPHAGOGASTRODUODENOSCOPY (EGD) WITH PROPOFOL N/A 10/27/2016   PROPOFOL;  Surgeon: Daneil Dolin, MD; Barrett's esophagus without dysplasia or malignancy.  Normal stomach with previous gastric ulcer healed.  2 duodenal polyps revealing fragments of small bowel adenoma, otherwise normal.  Recommendations to repeat endoscopy in 1 year.  . ESOPHAGOGASTRODUODENOSCOPY (EGD) WITH PROPOFOL N/A 02/09/2020   Procedure:  ESOPHAGOGASTRODUODENOSCOPY (EGD) WITH PROPOFOL;  Surgeon: Daneil Dolin, MD;  [facility];  Service: Endoscopy;  Laterality: N/A;  . FLEXIBLE SIGMOIDOSCOPY N/A 08/20/2016   Dr. Arnoldo Morale: Hematin found in mid sigmoid colon. One hemostatic clip placed.  Marland Kitchen POLYPECTOMY  02/09/2020   Procedure: POLYPECTOMY;  Surgeon: Daneil Dolin, MD;  [facility];  Service: Endoscopy;;    Current Outpatient Medications  Medication Sig Dispense Refill  . ALPRAZolam (XANAX) 1 MG tablet Take 2 mg by mouth at bedtime.     Marland Kitchen atorvastatin (LIPITOR) 40 MG tablet Take 40 mg by mouth daily at 6 PM.     . Biotin 10000 MCG TABS Take 1 tablet by mouth daily.    . Calcium Carb-Cholecalciferol (CALCIUM 600/VITAMIN D3) 600-800 MG-UNIT TABS Take 1 tablet by mouth daily.    . cetirizine (ZYRTEC) 10 MG tablet Take 10 mg by mouth daily.    . cholecalciferol (VITAMIN D3) 25 MCG (1000 UT) tablet Take 1,000 Units by mouth daily.    . fenofibrate 160 MG tablet Take 160 mg by mouth daily.    Marland Kitchen HYDROcodone-acetaminophen (NORCO) 10-325 MG tablet Take 1 tablet by mouth every 6 (six) hours as needed (for pain).     . magnesium oxide (MAG-OX) 400 MG tablet Take 400 mg by mouth daily.    Marland Kitchen omeprazole (PRILOSEC) 20 MG capsule Take 20 mg by mouth daily.    . vitamin B-12 (CYANOCOBALAMIN) 500 MCG tablet Take 500 mcg by mouth daily.    . Zinc 50 MG TABS Take 50 mg by mouth daily.      No current facility-administered medications for this visit.    Allergies:   Patient has no known allergies.    Social History:  The patient  reports that he quit smoking about 6 months ago. His smoking use included cigarettes. He has a 10.00 pack-year smoking history. He has never used smokeless tobacco. He reports current alcohol use. He reports that he does not use drugs.   Family History:  The patient's family history includes CAD (age of onset: 29) in his mother; Cirrhosis in his mother; Esophageal cancer in his brother; Heart  disease in his father.  He indicated that his mother is deceased. He indicated that his father is deceased. He indicated that all of his three sisters are alive. He indicated that only one of his two brothers is alive. He indicated that the status of his neg hx is unknown.   ROS:  Please see the history of present illness. All other systems are reviewed and negative.    PHYSICAL EXAM: VS:  BP (!) 150/86   Pulse 76   Temp 98.7 F (37.1 C)   Ht 6\' 3"  (1.905 m)   Wt 193 lb (87.5 kg)   SpO2 98%   BMI 24.12 kg/m  , BMI Body mass index is 24.12 kg/m. GEN: Well nourished, well developed, male in no acute distress HEENT: normal for age  Neck: no JVD, no carotid bruit, no masses Cardiac: RRR; no murmur, no rubs, or gallops Respiratory:  Scattered dry rales to auscultation bilaterally, normal work of breathing GI:  soft, nontender, nondistended, + BS MS: no deformity or atrophy; no edema; distal pulses are 2+ in all 4 extremities  Skin: warm and dry, no rash Neuro:  Strength and sensation are intact Psych: euthymic mood, full affect   EKG:  EKG is not ordered today.  ECHO: NONE  CARDIAC CT: 03/29/2020 Coronary Arteries:  Normal coronary origin.  Right dominance.  RCA is a large dominant artery that gives rise to PDA and PLA. There is heavy calcified plaque in the proximal/ostial RCA causing severe stenosis (70-99%).  Left main is a large artery that gives rise to LAD and LCX arteries.  LAD is a large vessel that has heavy calcified plaque in the proximal to mid vessel. There is mild stenosis in the proximal LAD and severe stenosis in the mid LAD (70-99%)  LCX is a non-dominant artery that gives rise to an OM1 branch. There is heavily calcified plaque in the proximal LCx causing at least moderate stenosis (50-69%).  Other findings:  Normal pulmonary vein drainage into the left atrium.  Normal left atrial appendage without a thrombus.  Normal size of the pulmonary  artery.  IMPRESSION: 1. Coronary calcium score of 1740. This was 99th percentile for age and sex matched control.  2. Normal coronary origin with right dominance.  3. Heavily calcified coronary arteries, causing severe stenosis in the proximal/ostial RCA and mid LAD (70-99%).  4. Heavily calcified plaque in the proximal LCx causing atleast moderate stenosis.  5. CAD-RADS 4 Severe stenosis. (70-99% or > 50% left main). Cardiac catheterization or CT FFR is recommended. Consider symptom-guided anti-ischemic pharmacotherapy as well as risk factor modification per guideline directed care. Additional analysis with CT FFR will be submitted and reported separately.  FFR: 04/02/2020 FINDINGS: FFRct analysis was performed on the original cardiac CT angiogram dataset. Diagrammatic representation of the FFRct analysis is provided in a separate PDF document in PACS. This dictation was created using the PDF document and an interactive 3D model of the results. 3D model is not available in the EMR/PACS. Normal FFR range is >0.80.  1. Left Main:  No significant stenosis.  2. LAD: No significant focal stenosis. FFR 0.91 proximally, slowly tapers to 0.75 distally. 3. LCX: No significant focal stenosis. Non dominant vessel. FFR 0.92 proximally, slowly tapers to 0.71 distally 4. RCA: FFR unavailable due to significant CCTA artifact  IMPRESSION: 1. CT FFR analysis didn't show any significant focal stenosis in the LAD or LCx.  2. FFR unavailable in the RCA due to significant CCTA artifact.  Recent Labs: 03/26/2020: BUN 14; Creatinine, Ser 1.03; Potassium 3.8; Sodium 139  CBC    Component Value Date/Time   WBC 6.8 10/24/2016 1309   RBC 4.40 10/24/2016 1309   HGB 12.5 (L) 10/24/2016 1309   HCT 38.7 (L) 10/24/2016 1309   PLT 289 10/24/2016 1309   MCV 88.0 10/24/2016 1309   MCH 28.4 10/24/2016 1309   MCHC 32.3 10/24/2016 1309   RDW 13.4 10/24/2016 1309   LYMPHSABS 1.6 10/24/2016  1309   MONOABS 0.8 10/24/2016 1309   EOSABS 0.1 10/24/2016 1309   BASOSABS 0.0 10/24/2016 1309   CMP Latest Ref Rng & Units 03/26/2020 10/24/2016 08/20/2016  Glucose 70 - 99 mg/dL 100(H) 114(H) 102(H)  BUN 6 - 20 mg/dL 14 14 -  Creatinine 0.61 - 1.24 mg/dL 1.03 0.91 -  Sodium 135 - 145 mmol/L 139 138 142  Potassium 3.5 - 5.1 mmol/L 3.8 3.9 3.6  Chloride 98 - 111 mmol/L 104 105 -  CO2 22 - 32 mmol/L 26 26 -  Calcium 8.9 - 10.3 mg/dL 9.4 9.8 -  Total Protein 6.5 - 8.1 g/dL - - -  Total Bilirubin 0.3 - 1.2 mg/dL - - -  Alkaline Phos 38 - 126 U/L - - -  AST 15 - 41 U/L - - -  ALT 17 - 63 U/L - - -     Lipid Panel No results found for: CHOL, HDL, LDLCALC, LDLDIRECT, TRIG, CHOLHDL    Wt Readings from Last 3 Encounters:  04/06/20 193 lb (87.5 kg)  03/07/20 192 lb (87.1 kg)  02/09/20 197 lb 15.6 oz (89.8 kg)     Other studies Reviewed: Additional studies/ records that were reviewed today include: Office notes, hospital records and testing.  ASSESSMENT AND PLAN:  1.  CAD: -He is not currently having any chest pain - he had dyspnea on exertion, unclear if this is anginal equivalent -He has at least moderate disease in the LAD and circumflex that was negative by FFR -However, the proximal/ostial RCA appeared to have severe stenosis, greater than 70%.  FFR was unable to be performed on the RCA. -The severity of the multivessel coronary artery disease warrants cardiac catheterization - Cardiac catheterization was discussed with the patient fully. The patient understands that risks include but are not limited to stroke (1 in 1000), death (1 in 84), kidney failure [usually temporary] (1 in 500), bleeding (1 in 200), allergic reaction [possibly serious] (1 in 200).  The patient understands and is willing to proceed.   -However his GI issues may delay this, he is to let us know. - it he develops any new sx>>contact us immediately  2.  GI issues: -He recently had EGD and colonoscopy.   He has some Barrett's esophagus, and 7 polyps were removed, no high-grade dysplasia -Follow-up with Dr. Rush Landmark today as scheduled - coordinate w/ GI to get the best long-term plan.   3. HTN - add BB  4. HLD - PCP recently increased his Lipitor - w/ CAD on CT, goal LDL < 70 - request Labs from Dr Karie Kirks   Current medicines are reviewed at length with the patient today.  The patient does not have concerns regarding medicines.  The following changes have been made: he needs to be on a beta-blocker and we will give him a prescription for nitroglycerin as well, we will call him with these  Labs/ tests ordered today include:  No orders of the defined types were placed in this encounter.    Disposition:   FU with Rozann Lesches, MD  Signed, Rosaria Ferries, PA-C  04/06/2020 2:21 PM    [place] Phone: [phone]; Fax: [phone]

## 2020-04-06 NOTE — Patient Instructions (Signed)
Medication Instructions:  Your physician recommends that you continue on your current medications as directed. Please refer to the Current Medication list given to you today.  *If you need a refill on your cardiac medications before your next appointment, please call your pharmacy*   Lab Work: NONE  If you have labs (blood work) drawn today and your tests are completely normal, you will receive your results only by: Marland Kitchen MyChart Message (if you have MyChart) OR . A paper copy in the mail If you have any lab test that is abnormal or we need to change your treatment, we will call you to review the results.   Testing/Procedures: NONE    Follow-Up: At [facility], you and your health needs are our priority.  As part of our continuing mission to provide you with exceptional heart care, we have created designated Provider Care Teams.  These Care Teams include your primary Cardiologist (physician) and Advanced Practice Providers (APPs -  Physician Assistants and Nurse Practitioners) who all work together to provide you with the care you need, when you need it.  We recommend signing up for the patient portal called "MyChart".  Sign up information is provided on this After Visit Summary.  MyChart is used to connect with patients for Virtual Visits (Telemedicine).  Patients are able to view lab/test results, encounter notes, upcoming appointments, etc.  Non-urgent messages can be sent to your provider as well.   To learn more about what you can do with MyChart, go to [email or web address].    Your next appointment:   1 month(s)  The format for your next appointment:   In Person  Provider:   Rozann Lesches, MD   Other Instructions Thank you for choosing [facility]!

## 2020-04-06 NOTE — Telephone Encounter (Signed)
   Spoke with patient on the phone.  He saw Dr. Rush Landmark today and has an EGD scheduled for 5/26.  Dr. Rush Landmark stated that he would contact us next week to make sure that Mr. Maheu is okay for the procedure.  There was something that was seen on his EGD that Dr. Gala Romney was unable to remove completely.  That is why he needs another EGD.   Because the biopsies were negative (although no submucosa was present for evaluation in the duodenum), I feel the best option is to go ahead with his cardiac evaluation and get the heart catheterization done first.  Request that I contact his sister and let her know the plan.  Through his sister, he has My Chart, he can get information through there.  He would like to get the heart catheterization done as soon as possible.  I will add a beta-blocker and sublingual nitroglycerin to his medication regimen.  Rosaria Ferries, PA-C 04/06/2020 5:36 PM

## 2020-04-08 NOTE — Telephone Encounter (Signed)
Thank you for updating me.  I agree with going ahead and getting the diagnostic cardiac catheterization performed.  It may end up being a situation where he can be managed medically in order for him to proceed with EGD if this is felt to be more urgent in terms of making a GI diagnosis soon.  In general, endoscopy should not be a high risk situation even in the setting of CAD as long as he does not have unstable symptoms and is on appropriate medications.  If he ends up getting a PCI, then he is going to need to be on uninterrupted dual antiplatelet therapy for at least 6 months.  If that is not an appropriate situation in terms of making a GI diagnosis in the near future, this will need to be weighed very carefully by the interventionalist performing the cardiac catheterization.  It would be useful to know from Dr. Donneta Romberg perspective whether delaying EGD and GI diagnosis for at least 6 months is acceptable or not.

## 2020-04-09 ENCOUNTER — Encounter: Payer: Self-pay | Admitting: Gastroenterology

## 2020-04-09 ENCOUNTER — Other Ambulatory Visit: Payer: Self-pay

## 2020-04-09 ENCOUNTER — Other Ambulatory Visit (HOSPITAL_COMMUNITY)
Admission: RE | Admit: 2020-04-09 | Discharge: 2020-04-09 | Disposition: A | Discharge status: Home / Self Care | Payer: 59 | Source: Ambulatory Visit | Attending: Cardiovascular Disease | Admitting: Cardiovascular Disease

## 2020-04-09 DIAGNOSIS — Z01812 Encounter for preprocedural laboratory examination: Secondary | ICD-10-CM | POA: Insufficient documentation

## 2020-04-09 DIAGNOSIS — Z20822 Contact with and (suspected) exposure to covid-19: Secondary | ICD-10-CM | POA: Insufficient documentation

## 2020-04-09 DIAGNOSIS — R933 Abnormal findings on diagnostic imaging of other parts of digestive tract: Secondary | ICD-10-CM | POA: Insufficient documentation

## 2020-04-09 DIAGNOSIS — K3189 Other diseases of stomach and duodenum: Secondary | ICD-10-CM | POA: Insufficient documentation

## 2020-04-09 DIAGNOSIS — D132 Benign neoplasm of duodenum: Secondary | ICD-10-CM | POA: Insufficient documentation

## 2020-04-09 NOTE — Progress Notes (Signed)
[place] VISIT   Primary Care Provider Lemmie Evens, MD [place] [address] [phone]  Referring Provider Dr. Gala Romney  Patient Profile: Paul Arias is a 54 y.o. male  with a pmh significant for CAD (medically controlled), hypertension, hyperlipidemia, COPD, bipolar disorder, previous alcohol use disorder, colon polyps, GERD, Barrett's esophagus, duodenal adenoma status post previous resection.  The patient presents to the [facility] for an evaluation and management of problem(s) noted below:  Problem List 1. Duodenal nodule   2. History of Adenomatous duodenal polyp   3. Abnormal endoscopy of upper gastrointestinal tract     History of Present Illness This is the patient's first visit to the outpatient [place] clinic.  He is referred by Dr. Gala Romney for further endoscopic evaluation and potential management of a duodenal nodule.  The patient's history is most consistent that back in 2017 he was undergoing an upper endoscopy where he was found to have a duodenal adenoma.  This was resected by one of the prior surgeons.  The next day, the patient had significant bleeding requiring a repeat upper endoscopy by Dr. Gala Romney.  This led to therapy in the region with abatement of bleeding.  Patient had follow-up endoscopy a few months later with findings of Barrett's esophagus and also findings of recurrent duodenal adenoma.  The polypoid areas were resected and returned as adenomatous.  Patient followed up this year for Barrett's esophagus surveillance.  At the time Dr. Gala Romney found a recurrent lesion that was concerning for potential adenomatous tissue as well as "satellite lesions surrounding it".  Biopsies returned showing no evidence of adenoma.  Patient was referred for consideration of further endoscopic evaluation potential resection.  Patient is not having any other significant GI symptoms at this time.  He denies any  overt melena or hematochezia.  He was recently evaluated earlier today by cardiology where there is concern that he may have progressive CAD.  They are considering a cardiac catheterization in the coming weeks.  He is currently not on any significant antiplatelet therapy but may end up needing to have therapy performed based on upcoming potential cardiac catheterization.  He is not sure what the next best step should be in regards to his evaluation and treatment.  He did fine when he had underwent his endoscopy with Dr. Gala Romney in the last month.  GI Review of Systems Positive as above Negative for dysphagia, odynophagia, nausea, vomiting, weight loss, change in bowel habits  Review of Systems General: Denies fevers/chills HEENT: Denies oral lesions Cardiovascular: Denies current chest pain but this is further evaluated by cardiology's evaluation earlier today Pulmonary: Denies current shortness of breath but this is further evaluated by cardiology evaluation earlier today Gastroenterological: See HPI Genitourinary: Denies darkened urine Hematological: Denies easy bruising/bleeding Dermatological: Denies jaundice Psychological: Mood is stable   Medications Current Outpatient Medications  Medication Sig Dispense Refill  . ALPRAZolam (XANAX) 1 MG tablet Take 2 mg by mouth at bedtime.     Marland Kitchen atorvastatin (LIPITOR) 40 MG tablet Take 40 mg by mouth daily at 6 PM.     . Biotin 10000 MCG TABS Take 1 tablet by mouth daily.    . Calcium Carb-Cholecalciferol (CALCIUM 600/VITAMIN D3) 600-800 MG-UNIT TABS Take 1 tablet by mouth daily.    . cetirizine (ZYRTEC) 10 MG tablet Take 10 mg by mouth daily.    . cholecalciferol (VITAMIN D3) 25 MCG (1000 UT) tablet Take 1,000 Units by mouth daily.    Marland Kitchen  fenofibrate 160 MG tablet Take 160 mg by mouth daily.    Marland Kitchen HYDROcodone-acetaminophen (NORCO) 10-325 MG tablet Take 1 tablet by mouth every 6 (six) hours as needed (for pain).     . magnesium oxide (MAG-OX) 400 MG  tablet Take 400 mg by mouth daily.    Marland Kitchen omeprazole (PRILOSEC) 20 MG capsule Take 20 mg by mouth daily.    . vitamin B-12 (CYANOCOBALAMIN) 500 MCG tablet Take 500 mcg by mouth daily.    . Zinc 50 MG TABS Take 50 mg by mouth daily.     . metoprolol succinate (TOPROL XL) 50 MG 24 hr tablet Take 1 tablet (50 mg total) by mouth daily. Take 1/2 tablet daily for a week, then increase to 1 tablet daily. Take with or immediately following a meal. 30 tablet 11  . nitroGLYCERIN (NITROSTAT) 0.4 MG SL tablet Place 1 tablet (0.4 mg total) under the tongue every 5 (five) minutes as needed for chest pain. 25 tablet 3   No current facility-administered medications for this visit.    Allergies No Known Allergies  Histories Past Medical History:  Diagnosis Date  . Abnormal cardiac CT angiography 04/06/2020  . Alcohol dependence in remission ([place])   . Allergic rhinitis   . Bipolar disorder ([place])   . Chronic pain   . Colon polyp    Tubular adenoma  . COPD (chronic obstructive pulmonary disease) ([place])   . Gastric polyp    Tubular adenoma  . GERD (gastroesophageal reflux disease)   . Mixed hyperlipidemia    Past Surgical History:  Procedure Laterality Date  . BIOPSY  02/09/2020   Procedure: BIOPSY;  Surgeon: Daneil Dolin, MD;  [facility];  Service: Endoscopy;;  . COLONOSCOPY N/A 08/19/2016   Dr. Aviva Signs: 2 semi-pedunculated polyps in the distal sigmoid colon removed. Measure 2-5 mm. Tubular adenomas.  . COLONOSCOPY WITH PROPOFOL N/A 02/09/2020   Procedure: COLONOSCOPY WITH PROPOFOL;  Surgeon: Daneil Dolin, MD;  Location: [facility];  Service: Endoscopy;  Laterality: N/A;  1:00pm  . ESOPHAGOGASTRODUODENOSCOPY N/A 08/19/2016   Dr. Arnoldo Morale: Villous appearing mucosa in the first portion of the duodenum, polyp removed with hot snare, pathology tubular adenoma. Medium sized hiatal hernia. Biopsy for CLOtest which was negative.  . ESOPHAGOGASTRODUODENOSCOPY (EGD) WITH PROPOFOL  08/20/2016    Dr. Gala Romney: assisted Dr. Arnoldo Morale emergently. Esophagus normal. Medium sized hiatal hernia. One nonbleeding cratered stellate-shaped gastric ulcer measuring 8 mm. One cratered ulcerated area with adherent clot in the second portion of the duodenum. 25 mm in diameter. Visible vessel seen. Status post bleeding control therapy. Heaped up margins consistent with incomplete removal.   . ESOPHAGOGASTRODUODENOSCOPY (EGD) WITH PROPOFOL N/A 10/27/2016   PROPOFOL;  Surgeon: Daneil Dolin, MD; Barrett's esophagus without dysplasia or malignancy.  Normal stomach with previous gastric ulcer healed.  2 duodenal polyps revealing fragments of small bowel adenoma, otherwise normal.  Recommendations to repeat endoscopy in 1 year.  . ESOPHAGOGASTRODUODENOSCOPY (EGD) WITH PROPOFOL N/A 02/09/2020   Procedure: ESOPHAGOGASTRODUODENOSCOPY (EGD) WITH PROPOFOL;  Surgeon: Daneil Dolin, MD;  [facility];  Service: Endoscopy;  Laterality: N/A;  . FLEXIBLE SIGMOIDOSCOPY N/A 08/20/2016   Dr. Arnoldo Morale: Hematin found in mid sigmoid colon. One hemostatic clip placed.  Marland Kitchen POLYPECTOMY  02/09/2020   Procedure: POLYPECTOMY;  Surgeon: Daneil Dolin, MD;  Location: [facility];  Service: Endoscopy;;   Social History   Socioeconomic History  . Marital status: Single    Spouse name: Not  on file  . Number of children: 2  . Years of education: Not on file  . Highest education level: Not on file  Occupational History  . Not on file  Tobacco Use  . Smoking status: Former Smoker    Packs/day: 0.25    Years: 40.00    Pack years: 10.00    Types: Cigarettes    Quit date: 09/08/2019    Years since quitting: 0.5  . Smokeless tobacco: Never Used  Substance and Sexual Activity  . Alcohol use: Yes    Alcohol/week: 0.0 standard drinks    Comment: 3-4 beers/day and liquor  . Drug use: No  . Sexual activity: Never    Birth control/protection: None  Other Topics Concern  . Not on file  Social History Narrative  . Not on  file   Social Determinants of Health   Financial Resource Strain:   . Difficulty of Paying Living Expenses:   Food Insecurity:   . Worried About Charity fundraiser in the Last Year:   . Arboriculturist in the Last Year:   Transportation Needs:   . Film/video editor (Medical):   Marland Kitchen Lack of Transportation (Non-Medical):   Physical Activity:   . Days of Exercise per Week:   . Minutes of Exercise per Session:   Stress:   . Feeling of Stress :   Social Connections:   . Frequency of Communication with Friends and Family:   . Frequency of Social Gatherings with Friends and Family:   . Attends Religious Services:   . Active Member of Clubs or Organizations:   . Attends Archivist Meetings:   Marland Kitchen Marital Status:   Intimate Partner Violence:   . Fear of Current or Ex-Partner:   . Emotionally Abused:   Marland Kitchen Physically Abused:   . Sexually Abused:    Family History  Problem Relation Age of Onset  . Cirrhosis Mother   . CAD Mother 25       Premature CAD  . Heart disease Father 59       Premature CAD  . Esophageal cancer Brother   . Unexplained death Brother 28       died in his sleep  . Colon cancer Neg Hx   . Inflammatory bowel disease Neg Hx   . Liver disease Neg Hx   . Pancreatic cancer Neg Hx   . Stomach cancer Neg Hx    I have reviewed his medical, social, and family history in detail and updated the electronic medical record as necessary.    PHYSICAL EXAMINATION  BP (!) 142/90   Pulse 84   Temp 97.7 F (36.5 C)   Ht 6\' 3"  (1.905 m)   Wt 191 lb 8 oz (86.9 kg)   BMI 23.94 kg/m  Wt Readings from Last 3 Encounters:  04/06/20 191 lb 8 oz (86.9 kg)  04/06/20 193 lb (87.5 kg)  03/07/20 192 lb (87.1 kg)  GEN: NAD, appears stated age, doesn't appear chronically ill PSYCH: Cooperative, without pressured speech EYE: Conjunctivae pink, sclerae anicteric ENT: MMM, without oral ulcers CV: Nontachycardic RESP: No audible wheezing GI: NABS, soft, NT/ND, without  rebound or guarding MSK/EXT: No significant lower extremity edema SKIN: No jaundice NEURO:  Alert & Oriented x 3, no focal deficits   REVIEW OF DATA  I reviewed the following data at the time of this encounter:  GI Procedures and Studies  September 2017 EGD - Villous appearing mucosa in the first portion  of the duodenum. - Medium-sized hiatal hernia. Biopsied. - No endoscopic esophageal abnormality to explain patient's dysphagia. - The examination was otherwise normal. Pathology Diagnosis 1. Colon, polyp(s), sigmoid - TUBULAR ADENOMA(X1). - HIGH GRADE DYSPLASIA IS NOT IDENTIFIED. 2. Duodenum, NOS biopsy, polyp - TUBULAR ADENOMA. - NEGATIVE FOR HIGH GRADE DYSPLASIA.  September 2017 EGD - Normal esophagus. - Medium-sized hiatal hernia. - Non-bleeding gastric ulcer with no stigmata of bleeding. - One duodenal ulcer with adherent clot. Status post bleeding control therapy as described above  November 2017 EGD - Normal stomach. - Two duodenal polyps. Resected and retrieved. . - The examination was otherwise normal.Pathology Diagnosis 1. Duodenum, NOS biopsy, polyp - FRAGMENTS OF SMALL BOWEL ADENOMA (DUODENAL ADENOMA). - NO HIGH GRADE DYSPLASIA OR MALIGNANCY IDENTIFIED. - SEE COMMENT. 2. Esophagus, biopsy - FINDINGS CONSISTENT WITH BARRETT'S ESOPHAGUS. - NO DYSPLASIA OR MALIGNANCY IDENTIFIED.  March 2021 EGD -Probable short segment Barrett's esophagus. Biopsied. Small hiatal hernia. Multiple adenomatous appearing duodenal lesions. Status post biopsy of the largest one. Pathology FINAL MICROSCOPIC DIAGNOSIS:  A. DUODENUM, POLYPECTOMY:  - Duodenal mucosa with no significant pathologic findings.  - Negative for increased intraepithelial lymphocytes and villous  architectural changes.  - No submucosa present for evaluation.  B. ESOPHAGUS, BIOPSY:  - Gastroesophageal junction mucosa with reactive/regenerative changes.  - Negative for intestinal metaplasia (goblet cell  metaplasia).  Laboratory Studies  Reviewed those in epic  Imaging Studies  No relevant imaging to review   ASSESSMENT  Mr. Foland is a 54 y.o. male with a pmh significant for CAD (medically controlled), hypertension, hyperlipidemia, COPD, bipolar disorder, previous alcohol use disorder, colon polyps, GERD, Barrett's esophagus, duodenal adenoma status post previous resection.  The patient is seen today for evaluation and management of:  1. Duodenal nodule   2. History of Adenomatous duodenal polyp   3. Abnormal endoscopy of upper gastrointestinal tract    The patient is hemodynamically stable.  The endoscopic appearance of the duodenal lesion is concerning for potential recurrent adenoma.  With that being said, the lesion looks to be relatively discrete and superficial at this time.  With that being said, an upper endoscopy with possible advanced resection is not unreasonable due to his previous history of having a lesion in that area.  The complicating factor for the patient at this time is that cardiology is concerned about potential progressive CAD needs and need for cardiac catheterization.  This could mean that the patient may require dual antiplatelet therapy for a period in time should he be found to have high risk lesions.  I think that it is reasonable for [place] to see how cardiology wants to appropriate his next steps in evaluation.  My first evaluation and potential resection timing slot would not be for approximately 4 weeks.  If the patient were to need 6 months of dual antiplatelet therapy before being able to come off of this then we would have to accept this and move forward with a procedure around that time.  The patient is not sure what the next step should be but appreciates his providers talking with each other and then making the best decision for him.  In regards to the potential for an advanced resection, some of the techniques of advanced polypectomy which include Endoscopic  Mucosal Resection, OVESCO Full-Thickness Resection, Endorotor Morcellation, and Tissue Ablation via Fulguration.  The risks and benefits of endoscopic evaluation were discussed with the patient; these include but are not limited to the risk of perforation, infection, bleeding, missed lesions,  lack of diagnosis, severe illness requiring hospitalization, as well as anesthesia and sedation related illnesses.  During attempts at advanced resection, the risks of bleeding and perforation/leak are increased as opposed to diagnostic and screening procedures, and that was discussed with the patient as well.   In addition, I explained that with the possible need for piecemeal resection, subsequent short-interval endoscopic evaluation for follow up and potential retreatment of the lesion/area may be necessary.  If, after attempt at removal of the polyp/lesion, it is found that the patient has a complication or that an invasive lesion or malignant lesion is found, or that the polyp/lesion continues to recur, the patient is aware and understands that surgery may still be indicated/required.  All patient questions were answered, to the best of my ability, and the patient agrees to the aforementioned plan of action with follow-up as indicated.   PLAN  I will reach out to cardiology in the next couple of days to discuss my thoughts Recommend cardiology evaluation to pursue as soon as able If we have to wait for 6 months because of need for DAPT, then I think it is reasonable since his lesion, currently is noted to be nondysplastic but I do think there is an underlying chance of having dysplasia that we will need to attempt a resection at some point We will finalize a plan in the next week for patients procedure timing For now he is pended for potential EGD with EMR    Orders Placed This Encounter  Procedures  . Procedural/ Surgical Case Request: ESOPHAGOGASTRODUODENOSCOPY (EGD) WITH PROPOFOL, ENDOSCOPIC MUCOSAL  RESECTION  . CBC  . Protime-INR  . Ambulatory referral to Gastroenterology    New Prescriptions   METOPROLOL SUCCINATE (TOPROL XL) 50 MG 24 HR TABLET    Take 1 tablet (50 mg total) by mouth daily. Take 1/2 tablet daily for a week, then increase to 1 tablet daily. Take with or immediately following a meal.   NITROGLYCERIN (NITROSTAT) 0.4 MG SL TABLET    Place 1 tablet (0.4 mg total) under the tongue every 5 (five) minutes as needed for chest pain.   Modified Medications   No medications on file    Planned Follow Up No follow-ups on file.   Total Time in Face-to-Face and in Coordination of Care for patient including independent/personal interpretation/review of prior testing, medical history, examination, medication adjustment, communicating results with the patient directly, and documentation with the EHR is 45 minutes.   Justice Britain, MD [facility] Office # [phone]

## 2020-04-09 NOTE — Telephone Encounter (Signed)
This is hard to know. My first availability to schedule this patient for a procedure will be at least 4-weeks away. I would say that even if the lesion is dysplastic, the risk of it significantly changing in that time period should be low (though nothing is absolute in estimating), if we have to wait 55-months with uninterrupted DAPT. The sooner, you all have availability to perform a catheterization, the better in this case. If your catheterization time period is similar to when I would have availability for a procedure, then we can certainly entertain my procedure going first. I hope that helps you in your timing of scheduling the catetherization. Thanks. GM

## 2020-04-09 NOTE — H&P (View-Only) (Signed)
[facility] VISIT   Primary Care Provider Lemmie Evens, MD [place] [address] [phone]  Referring Provider Dr. Gala Romney  Patient Profile: Paul Arias is a 54 y.o. male  with a pmh significant for CAD (medically controlled), hypertension, hyperlipidemia, COPD, bipolar disorder, previous alcohol use disorder, colon polyps, GERD, Barrett's esophagus, duodenal adenoma status post previous resection.  The patient presents to the [facility] for an evaluation and management of problem(s) noted below:  Problem List 1. Duodenal nodule   2. History of Adenomatous duodenal polyp   3. Abnormal endoscopy of upper gastrointestinal tract     History of Present Illness This is the patient's first visit to the outpatient [facility].  He is referred by Dr. Gala Romney for further endoscopic evaluation and potential management of a duodenal nodule.  The patient's history is most consistent that back in 2017 he was undergoing an upper endoscopy where he was found to have a duodenal adenoma.  This was resected by one of the prior surgeons.  The next day, the patient had significant bleeding requiring a repeat upper endoscopy by Dr. Gala Romney.  This led to therapy in the region with abatement of bleeding.  Patient had follow-up endoscopy a few months later with findings of Barrett's esophagus and also findings of recurrent duodenal adenoma.  The polypoid areas were resected and returned as adenomatous.  Patient followed up this year for Barrett's esophagus surveillance.  At the time Dr. Gala Romney found a recurrent lesion that was concerning for potential adenomatous tissue as well as "satellite lesions surrounding it".  Biopsies returned showing no evidence of adenoma.  Patient was referred for consideration of further endoscopic evaluation potential resection.  Patient is not having any other significant GI symptoms at this time.  He denies any  overt melena or hematochezia.  He was recently evaluated earlier today by cardiology where there is concern that he may have progressive CAD.  They are considering a cardiac catheterization in the coming weeks.  He is currently not on any significant antiplatelet therapy but may end up needing to have therapy performed based on upcoming potential cardiac catheterization.  He is not sure what the next best step should be in regards to his evaluation and treatment.  He did fine when he had underwent his endoscopy with Dr. Gala Romney in the last month.  GI Review of Systems Positive as above Negative for dysphagia, odynophagia, nausea, vomiting, weight loss, change in bowel habits  Review of Systems General: Denies fevers/chills HEENT: Denies oral lesions Cardiovascular: Denies current chest pain but this is further evaluated by cardiology's evaluation earlier today Pulmonary: Denies current shortness of breath but this is further evaluated by cardiology evaluation earlier today Gastroenterological: See HPI Genitourinary: Denies darkened urine Hematological: Denies easy bruising/bleeding Dermatological: Denies jaundice Psychological: Mood is stable   Medications Current Outpatient Medications  Medication Sig Dispense Refill  . ALPRAZolam (XANAX) 1 MG tablet Take 2 mg by mouth at bedtime.     [place] Kitchen atorvastatin (LIPITOR) 40 MG tablet Take 40 mg by mouth daily at 6 PM.     . Biotin 10000 MCG TABS Take 1 tablet by mouth daily.    . Calcium Carb-Cholecalciferol (CALCIUM 600/VITAMIN D3) 600-800 MG-UNIT TABS Take 1 tablet by mouth daily.    . cetirizine (ZYRTEC) 10 MG tablet Take 10 mg by mouth daily.    . cholecalciferol (VITAMIN D3) 25 MCG (1000 UT) tablet Take 1,000 Units by mouth daily.    [place] Kitchen  fenofibrate 160 MG tablet Take 160 mg by mouth daily.    [place] Kitchen HYDROcodone-acetaminophen (NORCO) 10-325 MG tablet Take 1 tablet by mouth every 6 (six) hours as needed (for pain).     . magnesium oxide (MAG-OX) 400 MG  tablet Take 400 mg by mouth daily.    [place] Kitchen omeprazole (PRILOSEC) 20 MG capsule Take 20 mg by mouth daily.    . vitamin B-12 (CYANOCOBALAMIN) 500 MCG tablet Take 500 mcg by mouth daily.    . Zinc 50 MG TABS Take 50 mg by mouth daily.     . metoprolol succinate (TOPROL XL) 50 MG 24 hr tablet Take 1 tablet (50 mg total) by mouth daily. Take 1/2 tablet daily for a week, then increase to 1 tablet daily. Take with or immediately following a meal. 30 tablet 11  . nitroGLYCERIN (NITROSTAT) 0.4 MG SL tablet Place 1 tablet (0.4 mg total) under the tongue every 5 (five) minutes as needed for chest pain. 25 tablet 3   No current facility-administered medications for this visit.    Allergies No Known Allergies  Histories Past Medical History:  Diagnosis Date  . Abnormal cardiac CT angiography 04/06/2020  . Alcohol dependence in remission ([place])   . Allergic rhinitis   . Bipolar disorder ([place])   . Chronic pain   . Colon polyp    Tubular adenoma  . COPD (chronic obstructive pulmonary disease) ([place])   . Gastric polyp    Tubular adenoma  . GERD (gastroesophageal reflux disease)   . Mixed hyperlipidemia    Past Surgical History:  Procedure Laterality Date  . BIOPSY  02/09/2020   Procedure: BIOPSY;  Surgeon: Daneil Dolin, MD;  [facility];  Service: Endoscopy;;  . COLONOSCOPY N/A 08/19/2016   Dr. Aviva Signs: 2 semi-pedunculated polyps in the distal sigmoid colon removed. Measure 2-5 mm. Tubular adenomas.  . COLONOSCOPY WITH PROPOFOL N/A 02/09/2020   Procedure: COLONOSCOPY WITH PROPOFOL;  Surgeon: Daneil Dolin, MD;  [facility];  Service: Endoscopy;  Laterality: N/A;  1:00pm  . ESOPHAGOGASTRODUODENOSCOPY N/A 08/19/2016   Dr. Arnoldo Morale: Villous appearing mucosa in the first portion of the duodenum, polyp removed with hot snare, pathology tubular adenoma. Medium sized hiatal hernia. Biopsy for CLOtest which was negative.  . ESOPHAGOGASTRODUODENOSCOPY (EGD) WITH PROPOFOL  08/20/2016    Dr. Gala Romney: assisted Dr. Arnoldo Morale emergently. Esophagus normal. Medium sized hiatal hernia. One nonbleeding cratered stellate-shaped gastric ulcer measuring 8 mm. One cratered ulcerated area with adherent clot in the second portion of the duodenum. 25 mm in diameter. Visible vessel seen. Status post bleeding control therapy. Heaped up margins consistent with incomplete removal.   . ESOPHAGOGASTRODUODENOSCOPY (EGD) WITH PROPOFOL N/A 10/27/2016   PROPOFOL;  Surgeon: Daneil Dolin, MD; Barrett's esophagus without dysplasia or malignancy.  Normal stomach with previous gastric ulcer healed.  2 duodenal polyps revealing fragments of small bowel adenoma, otherwise normal.  Recommendations to repeat endoscopy in 1 year.  . ESOPHAGOGASTRODUODENOSCOPY (EGD) WITH PROPOFOL N/A 02/09/2020   Procedure: ESOPHAGOGASTRODUODENOSCOPY (EGD) WITH PROPOFOL;  Surgeon: Daneil Dolin, MD;  [facility];  Service: Endoscopy;  Laterality: N/A;  . FLEXIBLE SIGMOIDOSCOPY N/A 08/20/2016   Dr. Arnoldo Morale: Hematin found in mid sigmoid colon. One hemostatic clip placed.  [place] Kitchen POLYPECTOMY  02/09/2020   Procedure: POLYPECTOMY;  Surgeon: Daneil Dolin, MD;  [facility];  Service: Endoscopy;;   Social History   Socioeconomic History  . Marital status: Single    Spouse name: Not  on file  . Number of children: 2  . Years of education: Not on file  . Highest education level: Not on file  Occupational History  . Not on file  Tobacco Use  . Smoking status: Former Smoker    Packs/day: 0.25    Years: 40.00    Pack years: 10.00    Types: Cigarettes    Quit date: 09/08/2019    Years since quitting: 0.5  . Smokeless tobacco: Never Used  Substance and Sexual Activity  . Alcohol use: Yes    Alcohol/week: 0.0 standard drinks    Comment: 3-4 beers/day and liquor  . Drug use: No  . Sexual activity: Never    Birth control/protection: None  Other Topics Concern  . Not on file  Social History Narrative  . Not on  file   Social Determinants of Health   Financial Resource Strain:   . Difficulty of Paying Living Expenses:   Food Insecurity:   . Worried About Charity fundraiser in the Last Year:   . Arboriculturist in the Last Year:   Transportation Needs:   . Film/video editor (Medical):   [place] Kitchen Lack of Transportation (Non-Medical):   Physical Activity:   . Days of Exercise per Week:   . Minutes of Exercise per Session:   Stress:   . Feeling of Stress :   Social Connections:   . Frequency of Communication with Friends and Family:   . Frequency of Social Gatherings with Friends and Family:   . Attends Religious Services:   . Active Member of Clubs or Organizations:   . Attends Archivist Meetings:   [place] Kitchen Marital Status:   Intimate Partner Violence:   . Fear of Current or Ex-Partner:   . Emotionally Abused:   [place] Kitchen Physically Abused:   . Sexually Abused:    Family History  Problem Relation Age of Onset  . Cirrhosis Mother   . CAD Mother 45       Premature CAD  . Heart disease Father 52       Premature CAD  . Esophageal cancer Brother   . Unexplained death Brother 57       died in his sleep  . Colon cancer Neg Hx   . Inflammatory bowel disease Neg Hx   . Liver disease Neg Hx   . Pancreatic cancer Neg Hx   . Stomach cancer Neg Hx    I have reviewed his medical, social, and family history in detail and updated the electronic medical record as necessary.    PHYSICAL EXAMINATION  BP (!) 142/90   Pulse 84   Temp 97.7 F (36.5 C)   Ht 6\' 3"  (1.905 m)   Wt 191 lb 8 oz (86.9 kg)   BMI 23.94 kg/m  Wt Readings from Last 3 Encounters:  04/06/20 191 lb 8 oz (86.9 kg)  04/06/20 193 lb (87.5 kg)  03/07/20 192 lb (87.1 kg)  GEN: NAD, appears stated age, doesn't appear chronically ill PSYCH: Cooperative, without pressured speech EYE: Conjunctivae pink, sclerae anicteric ENT: MMM, without oral ulcers CV: Nontachycardic RESP: No audible wheezing GI: NABS, soft, NT/ND, without  rebound or guarding MSK/EXT: No significant lower extremity edema SKIN: No jaundice NEURO:  Alert & Oriented x 3, no focal deficits   REVIEW OF DATA  I reviewed the following data at the time of this encounter:  GI Procedures and Studies  September 2017 EGD - Villous appearing mucosa in the first portion  of the duodenum. - Medium-sized hiatal hernia. Biopsied. - No endoscopic esophageal abnormality to explain patient's dysphagia. - The examination was otherwise normal. Pathology Diagnosis 1. Colon, polyp(s), sigmoid - TUBULAR ADENOMA(X1). - HIGH GRADE DYSPLASIA IS NOT IDENTIFIED. 2. Duodenum, NOS biopsy, polyp - TUBULAR ADENOMA. - NEGATIVE FOR HIGH GRADE DYSPLASIA.  September 2017 EGD - Normal esophagus. - Medium-sized hiatal hernia. - Non-bleeding gastric ulcer with no stigmata of bleeding. - One duodenal ulcer with adherent clot. Status post bleeding control therapy as described above  November 2017 EGD - Normal stomach. - Two duodenal polyps. Resected and retrieved. . - The examination was otherwise normal.Pathology Diagnosis 1. Duodenum, NOS biopsy, polyp - FRAGMENTS OF SMALL BOWEL ADENOMA (DUODENAL ADENOMA). - NO HIGH GRADE DYSPLASIA OR MALIGNANCY IDENTIFIED. - SEE COMMENT. 2. Esophagus, biopsy - FINDINGS CONSISTENT WITH BARRETT'S ESOPHAGUS. - NO DYSPLASIA OR MALIGNANCY IDENTIFIED.  March 2021 EGD -Probable short segment Barrett's esophagus. Biopsied. Small hiatal hernia. Multiple adenomatous appearing duodenal lesions. Status post biopsy of the largest one. Pathology FINAL MICROSCOPIC DIAGNOSIS:  A. DUODENUM, POLYPECTOMY:  - Duodenal mucosa with no significant pathologic findings.  - Negative for increased intraepithelial lymphocytes and villous  architectural changes.  - No submucosa present for evaluation.  B. ESOPHAGUS, BIOPSY:  - Gastroesophageal junction mucosa with reactive/regenerative changes.  - Negative for intestinal metaplasia (goblet cell  metaplasia).  Laboratory Studies  Reviewed those in epic  Imaging Studies  No relevant imaging to review   ASSESSMENT  Mr. Braye is a 54 y.o. male with a pmh significant for CAD (medically controlled), hypertension, hyperlipidemia, COPD, bipolar disorder, previous alcohol use disorder, colon polyps, GERD, Barrett's esophagus, duodenal adenoma status post previous resection.  The patient is seen today for evaluation and management of:  1. Duodenal nodule   2. History of Adenomatous duodenal polyp   3. Abnormal endoscopy of upper gastrointestinal tract    The patient is hemodynamically stable.  The endoscopic appearance of the duodenal lesion is concerning for potential recurrent adenoma.  With that being said, the lesion looks to be relatively discrete and superficial at this time.  With that being said, an upper endoscopy with possible advanced resection is not unreasonable due to his previous history of having a lesion in that area.  The complicating factor for the patient at this time is that cardiology is concerned about potential progressive CAD needs and need for cardiac catheterization.  This could mean that the patient may require dual antiplatelet therapy for a period in time should he be found to have high risk lesions.  I think that it is reasonable for [place] to see how cardiology wants to appropriate his next steps in evaluation.  My first evaluation and potential resection timing slot would not be for approximately 4 weeks.  If the patient were to need 6 months of dual antiplatelet therapy before being able to come off of this then we would have to accept this and move forward with a procedure around that time.  The patient is not sure what the next step should be but appreciates his providers talking with each other and then making the best decision for him.  In regards to the potential for an advanced resection, some of the techniques of advanced polypectomy which include Endoscopic  Mucosal Resection, OVESCO Full-Thickness Resection, Endorotor Morcellation, and Tissue Ablation via Fulguration.  The risks and benefits of endoscopic evaluation were discussed with the patient; these include but are not limited to the risk of perforation, infection, bleeding, missed lesions,  lack of diagnosis, severe illness requiring hospitalization, as well as anesthesia and sedation related illnesses.  During attempts at advanced resection, the risks of bleeding and perforation/leak are increased as opposed to diagnostic and screening procedures, and that was discussed with the patient as well.   In addition, I explained that with the possible need for piecemeal resection, subsequent short-interval endoscopic evaluation for follow up and potential retreatment of the lesion/area may be necessary.  If, after attempt at removal of the polyp/lesion, it is found that the patient has a complication or that an invasive lesion or malignant lesion is found, or that the polyp/lesion continues to recur, the patient is aware and understands that surgery may still be indicated/required.  All patient questions were answered, to the best of my ability, and the patient agrees to the aforementioned plan of action with follow-up as indicated.   PLAN  I will reach out to cardiology in the next couple of days to discuss my thoughts Recommend cardiology evaluation to pursue as soon as able If we have to wait for 6 months because of need for DAPT, then I think it is reasonable since his lesion, currently is noted to be nondysplastic but I do think there is an underlying chance of having dysplasia that we will need to attempt a resection at some point We will finalize a plan in the next week for patients procedure timing For now he is pended for potential EGD with EMR    Orders Placed This Encounter  Procedures  . Procedural/ Surgical Case Request: ESOPHAGOGASTRODUODENOSCOPY (EGD) WITH PROPOFOL, ENDOSCOPIC MUCOSAL  RESECTION  . CBC  . Protime-INR  . Ambulatory referral to Gastroenterology    New Prescriptions   METOPROLOL SUCCINATE (TOPROL XL) 50 MG 24 HR TABLET    Take 1 tablet (50 mg total) by mouth daily. Take 1/2 tablet daily for a week, then increase to 1 tablet daily. Take with or immediately following a meal.   NITROGLYCERIN (NITROSTAT) 0.4 MG SL TABLET    Place 1 tablet (0.4 mg total) under the tongue every 5 (five) minutes as needed for chest pain.   Modified Medications   No medications on file    Planned Follow Up No follow-ups on file.   Total Time in Face-to-Face and in Coordination of Care for patient including independent/personal interpretation/review of prior testing, medical history, examination, medication adjustment, communicating results with the patient directly, and documentation with the EHR is 45 minutes.   Justice Britain, MD [facility] # [phone]

## 2020-04-10 LAB — SARS CORONAVIRUS 2 (TAT 6-24 HRS): SARS Coronavirus 2: NEGATIVE

## 2020-04-11 ENCOUNTER — Ambulatory Visit (HOSPITAL_COMMUNITY)
Admission: RE | Admit: 2020-04-11 | Discharge: 2020-04-11 | Disposition: A | Discharge status: Home / Self Care | Payer: 59 | Attending: Cardiovascular Disease | Admitting: Cardiovascular Disease

## 2020-04-11 ENCOUNTER — Other Ambulatory Visit: Payer: Self-pay

## 2020-04-11 ENCOUNTER — Encounter (HOSPITAL_COMMUNITY)
Admission: RE | Disposition: A | Discharge status: Home / Self Care | Payer: Self-pay | Source: Home / Self Care | Attending: Cardiovascular Disease

## 2020-04-11 DIAGNOSIS — I10 Essential (primary) hypertension: Secondary | ICD-10-CM | POA: Diagnosis not present

## 2020-04-11 DIAGNOSIS — E782 Mixed hyperlipidemia: Secondary | ICD-10-CM | POA: Diagnosis not present

## 2020-04-11 DIAGNOSIS — J449 Chronic obstructive pulmonary disease, unspecified: Secondary | ICD-10-CM | POA: Insufficient documentation

## 2020-04-11 DIAGNOSIS — I2584 Coronary atherosclerosis due to calcified coronary lesion: Secondary | ICD-10-CM | POA: Diagnosis not present

## 2020-04-11 DIAGNOSIS — K219 Gastro-esophageal reflux disease without esophagitis: Secondary | ICD-10-CM | POA: Insufficient documentation

## 2020-04-11 DIAGNOSIS — Z79899 Other long term (current) drug therapy: Secondary | ICD-10-CM | POA: Insufficient documentation

## 2020-04-11 DIAGNOSIS — Z87891 Personal history of nicotine dependence: Secondary | ICD-10-CM | POA: Insufficient documentation

## 2020-04-11 DIAGNOSIS — Z8249 Family history of ischemic heart disease and other diseases of the circulatory system: Secondary | ICD-10-CM | POA: Insufficient documentation

## 2020-04-11 DIAGNOSIS — I2510 Atherosclerotic heart disease of native coronary artery without angina pectoris: Secondary | ICD-10-CM | POA: Insufficient documentation

## 2020-04-11 DIAGNOSIS — F319 Bipolar disorder, unspecified: Secondary | ICD-10-CM | POA: Diagnosis not present

## 2020-04-11 DIAGNOSIS — R931 Abnormal findings on diagnostic imaging of heart and coronary circulation: Secondary | ICD-10-CM | POA: Diagnosis present

## 2020-04-11 DIAGNOSIS — I2582 Chronic total occlusion of coronary artery: Secondary | ICD-10-CM | POA: Insufficient documentation

## 2020-04-11 HISTORY — PX: LEFT HEART CATH AND CORONARY ANGIOGRAPHY: CATH118249

## 2020-04-11 SURGERY — LEFT HEART CATH AND CORONARY ANGIOGRAPHY
Anesthesia: LOCAL

## 2020-04-11 MED ORDER — VERAPAMIL HCL 2.5 MG/ML IV SOLN
INTRAVENOUS | Status: DC | PRN
  Administered 2020-04-11: 10 mL via INTRA_ARTERIAL

## 2020-04-11 MED ORDER — SODIUM CHLORIDE 0.9% FLUSH: 3.0000 mL | Freq: Two times a day (BID) | INTRAVENOUS | Status: DC

## 2020-04-11 MED ORDER — SODIUM CHLORIDE 0.9 % IV SOLN: 250.0000 mL | INTRAVENOUS | Status: DC | PRN

## 2020-04-11 MED ORDER — SODIUM CHLORIDE 0.9% FLUSH: 3.0000 mL | INTRAVENOUS | Status: DC | PRN

## 2020-04-11 MED ORDER — ASPIRIN 81 MG PO CHEW
81.0000 mg | CHEWABLE_TABLET | ORAL | Status: AC
  Administered 2020-04-11: 81 mg via ORAL

## 2020-04-11 MED ORDER — HEPARIN SODIUM (PORCINE) 1000 UNIT/ML IJ SOLN
INTRAMUSCULAR | Status: AC
  Filled 2020-04-11: qty 1

## 2020-04-11 MED ORDER — VERAPAMIL HCL 2.5 MG/ML IV SOLN
INTRAVENOUS | Status: AC
  Filled 2020-04-11: qty 2

## 2020-04-11 MED ORDER — MIDAZOLAM HCL 2 MG/2ML IJ SOLN
INTRAMUSCULAR | Status: AC
  Filled 2020-04-11: qty 2

## 2020-04-11 MED ORDER — ASPIRIN 81 MG PO CHEW: 81.0000 mg | CHEWABLE_TABLET | ORAL | Status: DC

## 2020-04-11 MED ORDER — LIDOCAINE HCL (PF) 1 % IJ SOLN
INTRAMUSCULAR | Status: DC | PRN
  Administered 2020-04-11: 2 mL

## 2020-04-11 MED ORDER — LABETALOL HCL 5 MG/ML IV SOLN: 10.0000 mg | INTRAVENOUS | Status: DC | PRN

## 2020-04-11 MED ORDER — HEPARIN (PORCINE) IN NACL 1000-0.9 UT/500ML-% IV SOLN
INTRAVENOUS | Status: AC
  Filled 2020-04-11: qty 1000

## 2020-04-11 MED ORDER — MIDAZOLAM HCL 2 MG/2ML IJ SOLN
INTRAMUSCULAR | Status: DC | PRN
  Administered 2020-04-11: 2 mg via INTRAVENOUS

## 2020-04-11 MED ORDER — HEPARIN (PORCINE) IN NACL 1000-0.9 UT/500ML-% IV SOLN
INTRAVENOUS | Status: DC | PRN
  Administered 2020-04-11 (×2): 500 mL

## 2020-04-11 MED ORDER — ASPIRIN 81 MG PO CHEW
CHEWABLE_TABLET | ORAL | Status: AC
  Filled 2020-04-11: qty 1

## 2020-04-11 MED ORDER — ONDANSETRON HCL 4 MG/2ML IJ SOLN: 4.0000 mg | Freq: Four times a day (QID) | INTRAMUSCULAR | Status: DC | PRN

## 2020-04-11 MED ORDER — LIDOCAINE HCL (PF) 1 % IJ SOLN
INTRAMUSCULAR | Status: AC
  Filled 2020-04-11: qty 30

## 2020-04-11 MED ORDER — FENTANYL CITRATE (PF) 100 MCG/2ML IJ SOLN
INTRAMUSCULAR | Status: DC | PRN
  Administered 2020-04-11: 25 ug via INTRAVENOUS

## 2020-04-11 MED ORDER — HEPARIN SODIUM (PORCINE) 1000 UNIT/ML IJ SOLN
INTRAMUSCULAR | Status: DC | PRN
  Administered 2020-04-11: 5000 [IU] via INTRAVENOUS

## 2020-04-11 MED ORDER — HYDRALAZINE HCL 20 MG/ML IJ SOLN: 10.0000 mg | INTRAMUSCULAR | Status: DC | PRN

## 2020-04-11 MED ORDER — SODIUM CHLORIDE 0.9 % WEIGHT BASED INFUSION: 1.0000 mL/kg/h | INTRAVENOUS | Status: DC

## 2020-04-11 MED ORDER — ACETAMINOPHEN 325 MG PO TABS: 650.0000 mg | ORAL_TABLET | ORAL | Status: DC | PRN

## 2020-04-11 MED ORDER — SODIUM CHLORIDE 0.9 % WEIGHT BASED INFUSION
3.0000 mL/kg/h | INTRAVENOUS | Status: AC
  Administered 2020-04-11: 3 mL/kg/h via INTRAVENOUS

## 2020-04-11 MED ORDER — FENTANYL CITRATE (PF) 100 MCG/2ML IJ SOLN
INTRAMUSCULAR | Status: AC
  Filled 2020-04-11: qty 2

## 2020-04-11 SURGICAL SUPPLY — 9 items

## 2020-04-11 NOTE — Interval H&P Note (Signed)
Cath Lab Visit (complete for each Cath Lab visit)  Clinical Evaluation Leading to the Procedure:   ACS: No.  Non-ACS:    Anginal Classification: CCS III  Anti-ischemic medical therapy: Minimal Therapy (1 class of medications)  Non-Invasive Test Results: High-risk stress test findings: cardiac mortality >3%/year  Prior CABG: No previous CABG      History and Physical Interval Note:  04/11/2020 3:08 PM  Paul Arias  has presented today for surgery, with the diagnosis of abnormal CT.  The various methods of treatment have been discussed with the patient and family. After consideration of risks, benefits and other options for treatment, the patient has consented to  Procedure(s): LEFT HEART CATH AND CORONARY ANGIOGRAPHY (N/A) as a surgical intervention.  The patient's history has been reviewed, patient examined, no change in status, stable for surgery.  I have reviewed the patient's chart and labs.  Questions were answered to the patient's satisfaction.     Sherren Mocha

## 2020-04-11 NOTE — Discharge Instructions (Signed)
Drink plenty of fluids for 48 hours and keep wrist elevated at heart level for 24 hours  Radial Site Care   This sheet gives you information about how to care for yourself after your procedure. Your health care provider may also give you more specific instructions. If you have problems or questions, contact your health care provider. What can I expect after the procedure? After the procedure, it is common to have:  Bruising and tenderness at the catheter insertion area. Follow these instructions at home: Medicines  Take over-the-counter and prescription medicines only as told by your health care provider. Insertion site care 1. Follow instructions from your health care provider about how to take care of your insertion site. Make sure you: ? Wash your hands with soap and water before you change your bandage (dressing). If soap and water are not available, use hand sanitizer. ? Remove your dressing as told by your health care provider. In 24 hours 2. Check your insertion site every day for signs of infection. Check for: ? Redness, swelling, or pain. ? Fluid or blood. ? Pus or a bad smell. ? Warmth. 3. Do not take baths, swim, or use a hot tub until your health care provider approves. 4. You may shower 24-48 hours after the procedure, or as directed by your health care provider. ? Remove the dressing and gently wash the site with plain soap and water. ? Pat the area dry with a clean towel. ? Do not rub the site. That could cause bleeding. 5. Do not apply powder or lotion to the site. Activity   1. For 24 hours after the procedure, or as directed by your health care provider: ? Do not flex or bend the affected arm. ? Do not push or pull heavy objects with the affected arm. ? Do not drive yourself home from the hospital or clinic. You may drive 24 hours after the procedure unless your health care provider tells you not to. ? Do not operate machinery or power tools. 2. Do not lift  anything that is heavier than 10 lb (4.5 kg), or the limit that you are told, until your health care provider says that it is safe.  For 4 days 3. Ask your health care provider when it is okay to: ? Return to work or school. ? Resume usual physical activities or sports. ? Resume sexual activity. General instructions  If the catheter site starts to bleed, raise your arm and put firm pressure on the site. If the bleeding does not stop, get help right away. This is a medical emergency.  If you went home on the same day as your procedure, a responsible adult should be with you for the first 24 hours after you arrive home.  Keep all follow-up visits as told by your health care provider. This is important. Contact a health care provider if:  You have a fever.  You have redness, swelling, or yellow drainage around your insertion site. Get help right away if:  You have unusual pain at the radial site.  The catheter insertion area swells very fast.  The insertion area is bleeding, and the bleeding does not stop when you hold steady pressure on the area.  Your arm or hand becomes pale, cool, tingly, or numb. These symptoms may represent a serious problem that is an emergency. Do not wait to see if the symptoms will go away. Get medical help right away. Call your local emergency services (911 in the [place] Do   not drive yourself to the hospital. Summary  After the procedure, it is common to have bruising and tenderness at the site.  Follow instructions from your health care provider about how to take care of your radial site wound. Check the wound every day for signs of infection.  Do not lift anything that is heavier than 10 lb (4.5 kg), or the limit that you are told, until your health care provider says that it is safe. This information is not intended to replace advice given to you by your health care provider. Make sure you discuss any questions you have with your health care  provider. Document Revised: 12/30/2017 Document Reviewed: 12/30/2017 Elsevier Patient Education  2020 [employer]  

## 2020-04-12 NOTE — Telephone Encounter (Signed)
I reviewed the results of the cardiac catheterization yesterday with Dr. Burt Knack, this information has also been communicated to Dr. Rush Landmark.  Yes, he can proceed with endoscopy at an acceptable cardiac risk, as I already mentioned endoscopic procedures with IV sedation are generally not high risk endeavors from a cardiac perspective.  The patient's coronary anatomy does not clearly warrant revascularization at this time particularly given the current situation, and he does not need to go on antiplatelet therapy at this point.  He is already on a beta-blocker and statin.

## 2020-04-12 NOTE — Telephone Encounter (Signed)
Cath results 05/05:  1. 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 Moderate calcific stenosis of the proximal/mid-LAD 3. Widely patent left circumflex/OM 4. Normal LV function   Recommend: medical therapy. Consider serial stress testing to rule out progressive LAD stenosis over time.  Dr Domenic Polite to advise if ok for pt to have GI procedure.  Kasha Howeth B.

## 2020-04-13 NOTE — Telephone Encounter (Signed)
The patient has been notified of this information and all questions answered. He will keep appt as planned

## 2020-04-13 NOTE — Telephone Encounter (Signed)
Thanks again for the update and getting him in and completed so quickly. I always begin my procedure with intention of doing monitored anesthesia care although at times general anesthesia does need to be performed depending on complexity of case. It is possible he may need general anesthesia from the standpoint of a perspective of safety should he have bleeding during the procedure. He is tentatively scheduled for later this month for procedure in the hospital, so we will move forward with that based on what you guys have described. Once we look at and decide on removing this area or not depending on how things are the complexity of the removal, then we will update you all and you will can decide at some point in the future the role of any further antiplatelet therapy if needed. Patty, please reach out to the patient and let them know that we will move forward with the planned procedure at the end of this month. Thanks. GM

## 2020-04-28 ENCOUNTER — Other Ambulatory Visit (HOSPITAL_COMMUNITY)
Admission: RE | Admit: 2020-04-28 | Discharge: 2020-04-28 | Disposition: A | Discharge status: Home / Self Care | Payer: 59 | Source: Ambulatory Visit | Attending: Gastroenterology | Admitting: Gastroenterology

## 2020-04-28 DIAGNOSIS — Z01812 Encounter for preprocedural laboratory examination: Secondary | ICD-10-CM | POA: Diagnosis not present

## 2020-04-28 DIAGNOSIS — Z20822 Contact with and (suspected) exposure to covid-19: Secondary | ICD-10-CM | POA: Diagnosis not present

## 2020-04-28 LAB — SARS CORONAVIRUS 2 (TAT 6-24 HRS): SARS Coronavirus 2: NEGATIVE

## 2020-04-30 ENCOUNTER — Telehealth: Payer: Self-pay | Admitting: Gastroenterology

## 2020-04-30 NOTE — Telephone Encounter (Signed)
Tried to call patient. Left voicemail. If he calls back please get phone number and time when available - since I'm the hospital MD it may be later today it could be tomorrow.  We will see. Thanks. GM

## 2020-04-30 NOTE — Telephone Encounter (Signed)
Called and spoke with patient. He gave [facility] for me to speak with sister. I called and spoke with her. Plans confirmed. She likely will come down to be care partner with brother but will work on discussing that further with him. Plan to move forward with EGD with potential EMR as we had previously discussed. Both individuals thankful for callback and moving forward with plan. No need for callback unless they have further questions.  Justice Britain, MD [facility] Office # [phone]

## 2020-05-01 NOTE — Anesthesia Preprocedure Evaluation (Addendum)
Anesthesia Evaluation  Patient identified by MRN, date of birth, ID band Patient awake    Reviewed: Allergy & Precautions, NPO status , Patient's Chart, lab work & pertinent test results  History of Anesthesia Complications Negative for: history of anesthetic complications  Airway Mallampati: II  TM Distance: >3 FB Neck ROM: Full    Dental  (+) Edentulous Upper, Dental Advisory Given   Pulmonary COPD, former smoker,    Pulmonary exam normal        Cardiovascular + CAD  Normal cardiovascular exam   '21 Cath - 1. 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 Moderate calcific stenosis of the proximal/mid-LAD 3. Widely patent left circumflex/OM 4. Normal LV function    Neuro/Psych PSYCHIATRIC DISORDERS Bipolar Disorder negative neurological ROS     GI/Hepatic PUD, GERD  Medicated and Controlled,(+)     substance abuse  alcohol use,   Endo/Other  negative endocrine ROS  Renal/GU negative Renal ROS     Musculoskeletal negative musculoskeletal ROS (+)   Abdominal   Peds  Hematology negative hematology ROS (+)   Anesthesia Other Findings Chronic pain Covid neg 04/28/20  Reproductive/Obstetrics                            Anesthesia Physical Anesthesia Plan  ASA: III  Anesthesia Plan: MAC   Post-op Pain Management:    Induction: Intravenous  PONV Risk Score and Plan: 1 and Propofol infusion and Treatment may vary due to age or medical condition  Airway Management Planned: Nasal Cannula and Natural Airway  Additional Equipment: None  Intra-op Plan:   Post-operative Plan:   Informed Consent: I have reviewed the patients History and Physical, chart, labs and discussed the procedure including the risks, benefits and alternatives for the proposed anesthesia with the patient or authorized representative who has  indicated his/her understanding and acceptance.       Plan Discussed with: CRNA and Anesthesiologist  Anesthesia Plan Comments:        Anesthesia Quick Evaluation

## 2020-05-02 ENCOUNTER — Ambulatory Visit (HOSPITAL_COMMUNITY): Payer: 59 | Admitting: Anesthesiology

## 2020-05-02 ENCOUNTER — Other Ambulatory Visit: Payer: Self-pay

## 2020-05-02 ENCOUNTER — Ambulatory Visit (HOSPITAL_COMMUNITY)
Admission: RE | Admit: 2020-05-02 | Discharge: 2020-05-02 | Disposition: A | Discharge status: Home / Self Care | Payer: 59 | Attending: Gastroenterology | Admitting: Gastroenterology

## 2020-05-02 ENCOUNTER — Encounter (HOSPITAL_COMMUNITY): Payer: Self-pay | Admitting: Gastroenterology

## 2020-05-02 ENCOUNTER — Encounter (HOSPITAL_COMMUNITY)
Admission: RE | Disposition: A | Discharge status: Home / Self Care | Payer: Self-pay | Source: Home / Self Care | Attending: Gastroenterology

## 2020-05-02 DIAGNOSIS — K317 Polyp of stomach and duodenum: Secondary | ICD-10-CM | POA: Diagnosis not present

## 2020-05-02 DIAGNOSIS — I10 Essential (primary) hypertension: Secondary | ICD-10-CM | POA: Diagnosis not present

## 2020-05-02 DIAGNOSIS — Z8711 Personal history of peptic ulcer disease: Secondary | ICD-10-CM | POA: Insufficient documentation

## 2020-05-02 DIAGNOSIS — D132 Benign neoplasm of duodenum: Secondary | ICD-10-CM | POA: Insufficient documentation

## 2020-05-02 DIAGNOSIS — Z87891 Personal history of nicotine dependence: Secondary | ICD-10-CM | POA: Diagnosis not present

## 2020-05-02 DIAGNOSIS — I2510 Atherosclerotic heart disease of native coronary artery without angina pectoris: Secondary | ICD-10-CM | POA: Diagnosis not present

## 2020-05-02 DIAGNOSIS — Z8719 Personal history of other diseases of the digestive system: Secondary | ICD-10-CM | POA: Diagnosis not present

## 2020-05-02 DIAGNOSIS — K219 Gastro-esophageal reflux disease without esophagitis: Secondary | ICD-10-CM | POA: Diagnosis not present

## 2020-05-02 DIAGNOSIS — Z8249 Family history of ischemic heart disease and other diseases of the circulatory system: Secondary | ICD-10-CM | POA: Diagnosis not present

## 2020-05-02 DIAGNOSIS — Z79899 Other long term (current) drug therapy: Secondary | ICD-10-CM | POA: Diagnosis not present

## 2020-05-02 DIAGNOSIS — E782 Mixed hyperlipidemia: Secondary | ICD-10-CM | POA: Diagnosis not present

## 2020-05-02 DIAGNOSIS — F319 Bipolar disorder, unspecified: Secondary | ICD-10-CM | POA: Diagnosis not present

## 2020-05-02 DIAGNOSIS — K3189 Other diseases of stomach and duodenum: Secondary | ICD-10-CM

## 2020-05-02 DIAGNOSIS — F1021 Alcohol dependence, in remission: Secondary | ICD-10-CM | POA: Insufficient documentation

## 2020-05-02 DIAGNOSIS — K449 Diaphragmatic hernia without obstruction or gangrene: Secondary | ICD-10-CM | POA: Diagnosis not present

## 2020-05-02 DIAGNOSIS — K228 Other specified diseases of esophagus: Secondary | ICD-10-CM | POA: Insufficient documentation

## 2020-05-02 DIAGNOSIS — J449 Chronic obstructive pulmonary disease, unspecified: Secondary | ICD-10-CM | POA: Insufficient documentation

## 2020-05-02 HISTORY — PX: SUBMUCOSAL LIFTING INJECTION: SHX6855

## 2020-05-02 HISTORY — PX: HEMOSTASIS CLIP PLACEMENT: SHX6857

## 2020-05-02 HISTORY — PX: ENDOSCOPIC MUCOSAL RESECTION: SHX6839

## 2020-05-02 HISTORY — PX: ESOPHAGOGASTRODUODENOSCOPY (EGD) WITH PROPOFOL: SHX5813

## 2020-05-02 SURGERY — ESOPHAGOGASTRODUODENOSCOPY (EGD) WITH PROPOFOL
Anesthesia: Monitor Anesthesia Care

## 2020-05-02 MED ORDER — OMEPRAZOLE 40 MG PO CPDR: 40.0000 mg | DELAYED_RELEASE_CAPSULE | Freq: Every day | ORAL | 3 refills | Status: DC

## 2020-05-02 MED ORDER — PROPOFOL 10 MG/ML IV BOLUS
INTRAVENOUS | Status: AC
  Filled 2020-05-02: qty 20

## 2020-05-02 MED ORDER — LIDOCAINE 2% (20 MG/ML) 5 ML SYRINGE
INTRAMUSCULAR | Status: DC | PRN
  Administered 2020-05-02: 80 mg via INTRAVENOUS

## 2020-05-02 MED ORDER — PROPOFOL 500 MG/50ML IV EMUL
INTRAVENOUS | Status: AC
  Filled 2020-05-02: qty 50

## 2020-05-02 MED ORDER — PROPOFOL 500 MG/50ML IV EMUL
INTRAVENOUS | Status: DC | PRN
  Administered 2020-05-02: 125 ug/kg/min via INTRAVENOUS

## 2020-05-02 MED ORDER — LACTATED RINGERS IV SOLN
INTRAVENOUS | Status: DC
  Administered 2020-05-02: 1000 mL via INTRAVENOUS

## 2020-05-02 MED ORDER — PROPOFOL 10 MG/ML IV BOLUS
INTRAVENOUS | Status: DC | PRN
  Administered 2020-05-02 (×3): 20 mg via INTRAVENOUS

## 2020-05-02 MED ORDER — SODIUM CHLORIDE 0.9 % IV SOLN: INTRAVENOUS | Status: DC

## 2020-05-02 SURGICAL SUPPLY — 14 items

## 2020-05-02 NOTE — Anesthesia Postprocedure Evaluation (Signed)
Anesthesia Post Note  Patient: Paul Arias  Procedure(s) Performed: ESOPHAGOGASTRODUODENOSCOPY (EGD) WITH PROPOFOL (N/A ) ENDOSCOPIC MUCOSAL RESECTION (N/A ) [place] PLACEMENT     Patient location during evaluation: PACU Anesthesia Type: MAC Level of consciousness: awake and alert Pain management: pain level controlled Vital Signs Assessment: post-procedure vital signs reviewed and stable Respiratory status: spontaneous breathing, nonlabored ventilation and respiratory function stable Cardiovascular status: stable and blood pressure returned to baseline Anesthetic complications: no    Last Vitals:  Vitals:   05/02/20 0929 05/02/20 0935  BP: (!) 153/87   Pulse: 71 66  Resp: 14 13  Temp:    SpO2: 98% 99%    Last Pain:  Vitals:   05/02/20 0929  TempSrc:   PainSc: 0-No pain                 Audry Pili

## 2020-05-02 NOTE — Op Note (Signed)
[facility] Patient Name: Paul Arias Procedure Date: 05/02/2020 MRN: [id number] Attending MD: Justice Britain , MD Date of Birth: 1966/11/21 CSN: [id number] Age: 54 Admit Type: Outpatient Procedure:                Upper GI endoscopy Indications:              Polyps in the duodenum, For therapy of polyps in                            the duodenum, History of previous Duodenal Adenoma                            s/p resection with bleeding in 2017 and repeat EGD                            in 2021 showing a duodenal polyp (benign pathology)                            but endoscopic views and history concerning Providers:                Justice Britain, MD, Cleda Daub, RN, Adair Laundry, CRNA Referring MD:             Norvel Richards, MD, Newt Minion, MD Medicines:                Monitored Anesthesia Care Complications:            No immediate complications. Estimated Blood Loss:     Estimated blood loss was minimal. Procedure:                Pre-Anesthesia Assessment:                           - Prior to the procedure, a History and Physical                            was performed, and patient medications and                            allergies were reviewed. The patient's tolerance of                            previous anesthesia was also reviewed. The risks                            and benefits of the procedure and the sedation                            options and risks were discussed with the patient.                            All questions were answered, and informed consent  was obtained. Prior Anticoagulants: The patient has                            taken no previous anticoagulant or antiplatelet                            agents except for aspirin. ASA Grade Assessment:                            III - A patient with severe systemic disease. After   reviewing the risks and benefits, the patient was                            deemed in satisfactory condition to undergo the                            procedure.                           After obtaining informed consent, the endoscope was                            passed under direct vision. Throughout the                            procedure, the patient's blood pressure, pulse, and                            oxygen saturations were monitored continuously. The                            [id number]-H190 ([id number]) Olympus gastroscope was                            introduced through the mouth, and advanced to the                            second part of duodenum. The [id number]-[id number] ([id number])                            [place] was introduced through the                            mouth, and advanced to the second part of duodenum.                            The upper GI endoscopy was technically difficult                            and complex. Successful completion of the procedure                            was aided by performing the maneuvers documented                            (  below) in this report. The patient tolerated the                            procedure. Scope In: Scope Out: Findings:      No gross lesions were noted in the proximal esophagus and in the mid       esophagus.      Scattered islands of salmon-colored mucosa were present at 41 cm. No       other visible abnormalities were present.      A hiatal hernia was noted and it was small.      No gross lesions were noted in the entire examined stomach.      A single 16 mm mucosal nodule was found in the first portion of the       duodenum and in the D1/D2 angle. Maintenance of scope position with the       endoscope and the duodenoscope was not great overall. Preparations were       made for mucosal resection. Orise gel was injected to raise the lesion       but was not significantly successful. I proceeded with an  attempt at       single snare resection but was not able to do so. Piecemeal mucosal       resection using a snare was performed. I avulsed some additional tissue       in the scarred region of the lesion in the middle. Resection and       retrieval were complete. To prevent bleeding after mucosal resection,       four hemostatic clips were successfully placed (MR conditional). There       was no bleeding at the end of the procedure.      No gross lesions were noted in the duodenal bulb and in the second       portion of the duodenum. Impression:               - No gross lesions in esophagus. Salmon-colored                            mucosa distally - going with history of previous                            Barrett's.                           - Small hiatal hernia.                           - No gross lesions in the stomach.                           - Mucosal nodule found in the D1/D2 angle. Complete                            removal was accomplished via piecemeal mucosal                            resection and avulsion. Clips (MR conditional) were  placed.                           - No gross lesions in the duodenal bulb and in the                            second portion of the duodenum. Moderate Sedation:      Not Applicable - Patient had care per Anesthesia. Recommendation:           - The patient will be observed post-procedure,                            until all discharge criteria are met.                           - Discharge patient to home.                           - No aspirin, ibuprofen, naproxen, or other                            non-steroidal anti-inflammatory drugs for 2 weeks.                           - Await pathology results.                           - Observe patient's clinical course.                           - Clear liquid diet today. If does well then can                            advance to soft diet tomorrow.                            - Increase Omeprazole to 40 mg twice daily for 1                            month and then 40 mg daily thereafter.                           - Plan for repeat EGD in 6-month if adenomatous                            tissue is found or in 1-year if no adenomatous                            tissue is found. Will be prepared for Endorotor v                            Hot Avulsion with next procedure if necessary.                           -  The findings and recommendations were discussed                            with the patient.                           - The findings and recommendations were discussed                            with the patient's family. Procedure Code(s):        --- Professional ---                           [phone], Esophagogastroduodenoscopy, flexible,                            transoral; with endoscopic mucosal resection Diagnosis Code(s):        --- Professional ---                           K22.8, Other specified diseases of esophagus                           K31.89, Other diseases of stomach and duodenum                           K31.7, Polyp of stomach and duodenum CPT copyright 2019 American Medical Association. All rights reserved. The codes documented in this report are preliminary and upon coder review may  be revised to meet current compliance requirements. Justice Britain, MD 05/02/2020 9:43:36 AM Number of Addenda: 0

## 2020-05-02 NOTE — Transfer of Care (Signed)
Immediate Anesthesia Transfer of Care Note  Patient: STANLY SI  Procedure(s) Performed: ESOPHAGOGASTRODUODENOSCOPY (EGD) WITH PROPOFOL (N/A ) ENDOSCOPIC MUCOSAL RESECTION (N/A ) SUBMUCOSAL LIFTING INJECTION HEMOSTASIS CLIP PLACEMENT  Patient Location: Endoscopy Unit  Anesthesia Type:MAC  Level of Consciousness: drowsy and patient cooperative  Airway & Oxygen Therapy: Patient Spontanous Breathing  Post-op Assessment: Report given to RN and Post -op Vital signs reviewed and stable  Post vital signs: Reviewed and stable  Last Vitals:  Vitals Value Taken Time  BP 143/91 05/02/20 0920  Temp    Pulse 72 05/02/20 0921  Resp 14 05/02/20 0921  SpO2 100 % 05/02/20 0921  Vitals shown include unvalidated device data.  Last Pain:  Vitals:   05/02/20 0645  TempSrc: Oral  PainSc: 0-No pain         Complications: No apparent anesthesia complications

## 2020-05-02 NOTE — Interval H&P Note (Signed)
History and Physical Interval Note:  05/02/2020 7:25 AM  Paul Arias BLAYDEN POLT  has presented today for surgery, with the diagnosis of duodenal nodule.  The various methods of treatment have been discussed with the patient and family. After consideration of risks, benefits and other options for treatment, the patient has consented to  Procedure(s): ESOPHAGOGASTRODUODENOSCOPY (EGD) WITH PROPOFOL (N/A) ENDOSCOPIC MUCOSAL RESECTION (N/A) as a surgical intervention.  The patient's history has been reviewed, patient examined, no change in status, stable for surgery.  I have reviewed the patient's chart and labs.  Questions were answered to the patient's satisfaction.     [employer]

## 2020-05-02 NOTE — Discharge Instructions (Signed)
YOU HAD AN ENDOSCOPIC PROCEDURE TODAY: Refer to the procedure report and other information in the discharge instructions given to you for any specific questions about what was found during the examination. If this information does not answer your questions, please call  office at [phone] to clarify.  ? ?YOU SHOULD EXPECT: Some feelings of bloating in the abdomen. Passage of more gas than usual. Walking can help get rid of the air that was put into your GI tract during the procedure and reduce the bloating. If you had a lower endoscopy (such as a colonoscopy or flexible sigmoidoscopy) you may notice spotting of blood in your stool or on the toilet paper. Some abdominal soreness may be present for a day or two, also. ? ?DIET: Your first meal following the procedure should be a light meal and then it is ok to progress to your normal diet. A half-sandwich or bowl of soup is an example of a good first meal. Heavy or fried foods are harder to digest and may make you feel nauseous or bloated. Drink plenty of fluids but you should avoid alcoholic beverages for 24 hours. If you had a esophageal dilation, please see attached instructions for diet.   ? ?ACTIVITY: Your care partner should take you home directly after the procedure. You should plan to take it easy, moving slowly for the rest of the day. You can resume normal activity the day after the procedure however YOU SHOULD NOT DRIVE, use power tools, machinery or perform tasks that involve climbing or major physical exertion for 24 hours (because of the sedation medicines used during the test).  ? ?SYMPTOMS TO REPORT IMMEDIATELY: ? ?Following upper endoscopy (EGD, EUS, ERCP, esophageal dilation) ?Vomiting of blood or coffee ground material  ?New, significant abdominal pain  ?New, significant chest pain or pain under the shoulder blades  ?Painful or persistently difficult swallowing  ?New shortness of breath  ?Black, tarry-looking or red, bloody  stools ? ?FOLLOW UP:  ?If any biopsies were taken you will be contacted by phone or by letter within the next 1-3 weeks. Call [phone]  if you have not heard about the biopsies in 3 weeks.  ?Please also call with any specific questions about appointments or follow up tests.  ?

## 2020-05-04 ENCOUNTER — Encounter: Payer: Self-pay | Admitting: *Deleted

## 2020-05-04 LAB — SURGICAL PATHOLOGY

## 2020-05-11 ENCOUNTER — Encounter: Payer: Self-pay | Admitting: Gastroenterology

## 2020-05-14 ENCOUNTER — Other Ambulatory Visit: Payer: Self-pay

## 2020-05-14 ENCOUNTER — Ambulatory Visit (INDEPENDENT_AMBULATORY_CARE_PROVIDER_SITE_OTHER): Payer: 59 | Admitting: Cardiology

## 2020-05-14 ENCOUNTER — Encounter: Payer: Self-pay | Admitting: Cardiology

## 2020-05-14 VITALS — BP 138/76 | HR 82 | Wt 193.0 lb

## 2020-05-14 DIAGNOSIS — I25119 Atherosclerotic heart disease of native coronary artery with unspecified angina pectoris: Secondary | ICD-10-CM

## 2020-05-14 DIAGNOSIS — E782 Mixed hyperlipidemia: Secondary | ICD-10-CM | POA: Diagnosis not present

## 2020-05-14 MED ORDER — ATORVASTATIN CALCIUM 40 MG PO TABS: 40.0000 mg | ORAL_TABLET | Freq: Every day | ORAL | 3 refills | Status: DC

## 2020-05-14 MED ORDER — METOPROLOL SUCCINATE ER 25 MG PO TB24
25.0000 mg | ORAL_TABLET | Freq: Every day | ORAL | 3 refills | Status: DC
Start: 2020-05-14 — End: 2021-06-20

## 2020-05-14 NOTE — Patient Instructions (Signed)
Medication Instructions:  Take Toprol XL 25 mg daily   DECREASE Lipitor to 40 mg daily at dinner    *If you need a refill on your cardiac medications before your next appointment, please call your pharmacy*   Lab Work: Fasting lipids JUST BEFORE next visit If you have labs (blood work) drawn today and your tests are completely normal, you will receive your results only by: Marland Kitchen MyChart Message (if you have MyChart) OR . A paper copy in the mail If you have any lab test that is abnormal or we need to change your treatment, we will call you to review the results.   Testing/Procedures: None today   Follow-Up: At [facility], you and your health needs are our priority.  As part of our continuing mission to provide you with exceptional heart care, we have created designated Provider Care Teams.  These Care Teams include your primary Cardiologist (physician) and Advanced Practice Providers (APPs -  Physician Assistants and Nurse Practitioners) who all work together to provide you with the care you need, when you need it.  We recommend signing up for the patient portal called "MyChart".  Sign up information is provided on this After Visit Summary.  MyChart is used to connect with patients for Virtual Visits (Telemedicine).  Patients are able to view lab/test results, encounter notes, upcoming appointments, etc.  Non-urgent messages can be sent to your provider as well.   To learn more about what you can do with MyChart, go to [email or web address].    Your next appointment:   3 month(s)  The format for your next appointment:   In Person  Provider:   Rozann Lesches, MD   Other Instructions None      Thank you for choosing [facility] !

## 2020-05-14 NOTE — Progress Notes (Signed)
Cardiology Office Note  Date: 05/14/2020   ID: Paul Arias, DOB 1966/08/11, MRN [id number]  PCP:  Lemmie Evens, MD  Cardiologist:  Rozann Lesches, MD Electrophysiologist:  None   Chief Complaint  Patient presents with  . Cardiac follow-up    History of Present Illness: Paul Arias is a 54 y.o. male last seen in April by Ms. Barrett PA-C.  He presents for a follow-up visit.  Overall, doing reasonably well from a cardiac perspective.  He does not describe any obvious angina symptoms, no increasing dyspnea on exertion.  Cardiac catheterization from May of this year revealed subtotal chronic occlusion of the RCA associated with left-to-right collaterals, also moderate calcific proximal to mid LAD stenosis, medical therapy was recommended by Dr. Burt Knack.  He is not on aspirin at this time.  Also not taking Toprol-XL.  He has been on Lipitor, not using nitroglycerin but has it available.  Interval GI work-up reviewed per Dr. Rush Landmark.  Past Medical History:  Diagnosis Date  . Alcohol dependence in remission ([place])   . Allergic rhinitis   . Bipolar disorder ([place])   . CAD (coronary artery disease)    Cardiac catheterization May 2021 - medical therapy recommended  . Chronic pain   . Colon polyp    Tubular adenoma  . COPD (chronic obstructive pulmonary disease) ([place])   . Gastric polyp    Tubular adenoma  . GERD (gastroesophageal reflux disease)   . Mixed hyperlipidemia     Past Surgical History:  Procedure Laterality Date  . BIOPSY  02/09/2020   Procedure: BIOPSY;  Surgeon: Daneil Dolin, MD;  [facility];  Service: Endoscopy;;  . COLONOSCOPY N/A 08/19/2016   Dr. Aviva Signs: 2 semi-pedunculated polyps in the distal sigmoid colon removed. Measure 2-5 mm. Tubular adenomas.  . COLONOSCOPY WITH PROPOFOL N/A 02/09/2020   Procedure: COLONOSCOPY WITH PROPOFOL;  Surgeon: Daneil Dolin, MD;  [facility];  Service: Endoscopy;  Laterality: N/A;   1:00pm  . ENDOSCOPIC MUCOSAL RESECTION N/A 05/02/2020   Procedure: ENDOSCOPIC MUCOSAL RESECTION;  Surgeon: Rush Landmark Telford Nab., MD;  Location: [facility];  Service: Gastroenterology;  Laterality: N/A;  . ESOPHAGOGASTRODUODENOSCOPY N/A 08/19/2016   Dr. Arnoldo Morale: Villous appearing mucosa in the first portion of the duodenum, polyp removed with hot snare, pathology tubular adenoma. Medium sized hiatal hernia. Biopsy for CLOtest which was negative.  . ESOPHAGOGASTRODUODENOSCOPY (EGD) WITH PROPOFOL  08/20/2016   Dr. Gala Romney: assisted Dr. Arnoldo Morale emergently. Esophagus normal. Medium sized hiatal hernia. One nonbleeding cratered stellate-shaped gastric ulcer measuring 8 mm. One cratered ulcerated area with adherent clot in the second portion of the duodenum. 25 mm in diameter. Visible vessel seen. Status post bleeding control therapy. Heaped up margins consistent with incomplete removal.   . ESOPHAGOGASTRODUODENOSCOPY (EGD) WITH PROPOFOL N/A 10/27/2016   PROPOFOL;  Surgeon: Daneil Dolin, MD; Barrett's esophagus without dysplasia or malignancy.  Normal stomach with previous gastric ulcer healed.  2 duodenal polyps revealing fragments of small bowel adenoma, otherwise normal.  Recommendations to repeat endoscopy in 1 year.  . ESOPHAGOGASTRODUODENOSCOPY (EGD) WITH PROPOFOL N/A 02/09/2020   Procedure: ESOPHAGOGASTRODUODENOSCOPY (EGD) WITH PROPOFOL;  Surgeon: Daneil Dolin, MD;  [facility];  Service: Endoscopy;  Laterality: N/A;  . ESOPHAGOGASTRODUODENOSCOPY (EGD) WITH PROPOFOL N/A 05/02/2020   Procedure: ESOPHAGOGASTRODUODENOSCOPY (EGD) WITH PROPOFOL;  Surgeon: Rush Landmark Telford Nab., MD;  Location: [facility];  Service: Gastroenterology;  Laterality: N/A;  . FLEXIBLE SIGMOIDOSCOPY N/A 08/20/2016   Dr. Arnoldo Morale: Hematin  found in mid sigmoid colon. One hemostatic clip placed.  Marland Kitchen HEMOSTASIS CLIP PLACEMENT  05/02/2020   Procedure: HEMOSTASIS CLIP PLACEMENT;  Surgeon: Irving Copas., MD;   [facility]: [facility];  Service: Gastroenterology;;  . LEFT HEART CATH AND CORONARY ANGIOGRAPHY N/A 04/11/2020   Procedure: LEFT HEART CATH AND CORONARY ANGIOGRAPHY;  Surgeon: Sherren Mocha, MD;  Location: [facility];  Service: Cardiovascular;  Laterality: N/A;  . POLYPECTOMY  02/09/2020   Procedure: POLYPECTOMY;  Surgeon: Daneil Dolin, MD;  [facility];  Service: Endoscopy;;  . SUBMUCOSAL LIFTING INJECTION  05/02/2020   Procedure: SUBMUCOSAL LIFTING INJECTION;  Surgeon: Irving Copas., MD;  Location: [facility];  Service: Gastroenterology;;    Current Outpatient Medications  Medication Sig Dispense Refill  . ALPRAZolam (XANAX) 1 MG tablet Take 2 mg by mouth at bedtime.     Marland Kitchen atorvastatin (LIPITOR) 40 MG tablet Take 1 tablet (40 mg total) by mouth at bedtime. 90 tablet 3  . Biotin 10000 MCG TABS Take 10,000 mcg by mouth daily.     . Calcium Carb-Cholecalciferol (CALCIUM 600/VITAMIN D3) 600-800 MG-UNIT TABS Take 1 tablet by mouth daily.    . cetirizine (ZYRTEC) 10 MG tablet Take 10 mg by mouth daily.    . cholecalciferol (VITAMIN D3) 25 MCG (1000 UT) tablet Take 1,000 Units by mouth daily.    . fenofibrate 160 MG tablet Take 160 mg by mouth at bedtime.     Marland Kitchen HYDROcodone-acetaminophen (NORCO) 10-325 MG tablet Take 1 tablet by mouth every 6 (six) hours as needed (for pain).     . magnesium oxide (MAG-OX) 400 MG tablet Take 400 mg by mouth daily.    . metoprolol succinate (TOPROL XL) 25 MG 24 hr tablet Take 1 tablet (25 mg total) by mouth daily. 90 tablet 3  . nitroGLYCERIN (NITROSTAT) 0.4 MG SL tablet Place 1 tablet (0.4 mg total) under the tongue every 5 (five) minutes as needed for chest pain. 25 tablet 3  . omeprazole (PRILOSEC) 40 MG capsule Take 1 capsule (40 mg total) by mouth daily before breakfast. 60 capsule 3  . vitamin B-12 (CYANOCOBALAMIN) 500 MCG tablet Take 500 mcg by mouth daily.    . Zinc 50 MG TABS Take 50 mg by mouth daily.      No current  facility-administered medications for this visit.   Allergies:  Patient has no known allergies.   ROS:   No palpitations or syncope.  Physical Exam: VS:  BP 138/76   Pulse 82   Wt 193 lb (87.5 kg)   SpO2 97%   BMI 24.12 kg/m , BMI Body mass index is 24.12 kg/m.  Wt Readings from Last 3 Encounters:  05/14/20 193 lb (87.5 kg)  05/02/20 195 lb 1.7 oz (88.5 kg)  04/06/20 191 lb 8 oz (86.9 kg)    General: Patient appears comfortable at rest. HEENT: Conjunctiva and lids normal, wearing a mask. Neck: Supple, no elevated JVP or carotid bruits, no thyromegaly. Lungs: Clear to auscultation, nonlabored breathing at rest. Cardiac: Regular rate and rhythm, no S3 or significant systolic murmur, no pericardial rub. Extremities: No pitting edema, distal pulses 2+.  ECG:  An ECG dated 04/11/2020 was personally reviewed today and demonstrated:  Sinus bradycardia.  Recent Labwork: 03/26/2020: BUN 14; Creatinine, Ser 1.03; Potassium 3.8; Sodium 139 04/06/2020: Hemoglobin 13.5; Platelets 300.0  February 2021: Cholesterol 236, HDL 50, triglycerides 253, LDL 147  Other Studies Reviewed Today:  Cardiac catheterization 04/11/2020: 1. Subtotal chronic occlusion  of the RCA with left-to-right collaterals and delayed antegrade filling of the vessel 2. Moderate calcific stenosis of the proximal/mid-LAD 3. Widely patent left circumflex/OM 4. Normal LV function  Assessment and Plan:  1.  CAD by cardiac catheterization in May, subtotally occluded RCA with left-to-right collaterals and moderate proximal to mid LAD stenosis, both managed medically at this point.  Holding off on aspirin for now in light of recent GI issues and prior history of bleeding.  Resume Toprol-XL at 25 mg daily, continue Lipitor with as needed nitroglycerin.  2.  Mixed hyperlipidemia, states that Lipitor was increased from 20 mg to 40 mg daily a few weeks ago.  Continue with current dose and follow-up FLP for next visit.  Medication  Adjustments/Labs and Tests Ordered: Current medicines are reviewed at length with the patient today.  Concerns regarding medicines are outlined above.   Tests Ordered: Orders Placed This Encounter  Procedures  . Lipid Profile    Medication Changes: Meds ordered this encounter  Medications  . atorvastatin (LIPITOR) 40 MG tablet    Sig: Take 1 tablet (40 mg total) by mouth at bedtime.    Dispense:  90 tablet    Refill:  3    05/14/20 reduced to 40 mg  . metoprolol succinate (TOPROL XL) 25 MG 24 hr tablet    Sig: Take 1 tablet (25 mg total) by mouth daily.    Dispense:  90 tablet    Refill:  3    05/14/20 reduced to 25 mg qd    Disposition:  Follow up 3 months in the [place] office.  Signed, Satira Sark, MD, [facility] 05/14/2020 2:10 PM    [facility] [address] [place], [place] [address] Phone: [phone]; Fax: [phone]

## 2020-06-25 ENCOUNTER — Telehealth: Payer: Self-pay | Admitting: Cardiology

## 2020-06-25 NOTE — Telephone Encounter (Signed)
Per Pt , as of today [facility] has informed him they have not rec'd CATH or CT notes as requested. Please advise per Pt. He is willing to pick up copies from our office or Please refax information to [facility].   Please call [phone]   Thanks renee

## 2020-06-25 NOTE — Telephone Encounter (Signed)
Pt called back, is coming to pick up results

## 2020-06-25 NOTE — Telephone Encounter (Signed)
I printed cardiac CT and cardiac cath report.It is with R.Valentine at front desk   Left message for pt to call back

## 2020-07-17 ENCOUNTER — Telehealth: Payer: Self-pay | Admitting: Gastroenterology

## 2020-07-17 ENCOUNTER — Telehealth: Payer: Self-pay

## 2020-07-17 NOTE — Telephone Encounter (Signed)
He could be having symptoms secondary to his gallbladder. Not sure if he is having pain on the left side or if this was a typo. We will need to see him in the office for further evaluation. Please schedule with the next available provider. Is he having BMs daily? Any specific food triggers? I recommend he avoid dietary triggers if identified, follow a low fat diet, avoid alcohol, and avoid NSAIDs. Should he have any severe pain, significant nausea with vomiting, or fever, he should proceed to the emergency room.

## 2020-07-17 NOTE — Telephone Encounter (Signed)
The pt has been advised to call his primary GI Dr Gala Romney with complaints of abd pain.  The pt has been advised of the information and verbalized understanding.

## 2020-07-17 NOTE — Telephone Encounter (Signed)
Spoke with pt. Pain is on the lt side. Pt is having BMs daily, pt doesn't feel food is triggering his pain. Pt was advised to follow a low fat diet, avoid alcohol, avoid NSAIDS. Pt was scheduled with EG tomorrow 07/18/20. Pt advised to got to the ED if symptoms worsen.

## 2020-07-17 NOTE — Telephone Encounter (Signed)
Pt called with c/o rt upper side pain x 1 week. Pt had a TCS 02/2020 with [facility] and was referred to Dr. Rush Landmark that did an EGD 04/2020. For the past week, pt has felt pain under his rib/lt side that comes and goes. Pt has also said the pain gives him some shortness of breath and he has some weakness. Pain hasn't been constant. Pt contacted Dr. Donneta Romberg office and pt was advised to call our office. Pt doesn't feel the pain that he feels is related to [place]. Pt takes Omeprazole daily and hasn't had any problems with his gerd symptoms per pt.

## 2020-07-17 NOTE — Telephone Encounter (Signed)
Noted  

## 2020-07-18 ENCOUNTER — Other Ambulatory Visit: Payer: Self-pay

## 2020-07-18 ENCOUNTER — Encounter: Payer: Self-pay | Admitting: Nurse Practitioner

## 2020-07-18 ENCOUNTER — Ambulatory Visit: Payer: 59 | Admitting: Nurse Practitioner

## 2020-07-18 VITALS — BP 128/78 | HR 71 | Temp 98.5°F | Ht 75.0 in | Wt 197.8 lb

## 2020-07-18 DIAGNOSIS — K219 Gastro-esophageal reflux disease without esophagitis: Secondary | ICD-10-CM

## 2020-07-18 DIAGNOSIS — R109 Unspecified abdominal pain: Secondary | ICD-10-CM

## 2020-07-18 DIAGNOSIS — D132 Benign neoplasm of duodenum: Secondary | ICD-10-CM

## 2020-07-18 NOTE — Progress Notes (Signed)
Referring Provider: Lemmie Evens, MD Primary Care Physician:  Lemmie Evens, MD Primary GI:  Dr. Gala Romney  Chief Complaint  Patient presents with  . Abdominal Pain    Left sided abd pain, pt says mass removed from small intestines 04/2020    HPI:   Paul Arias is a 54 y.o. male who presents for left-sided pain.  The patient was last seen in our office 11/14/2019 for duodenal adenoma, Barrett's, GERD, history of gastric ulcer, history of duodenal ulcer.  At his last visit he was due to schedule EGD and colonoscopy.  Noted history of alcohol abuse.  Previous EGD and colonoscopy in 2017 for colon cancer screening and reflux/dysphagia noted villous appearing mucosa in the first portion of duodenum that was resected (pathology revealed tubular adenoma) and 2 polyps removed in the sigmoid colon which were tubular adenomas.  Developed acute onset blood in stool and vomiting without hematemesis.  Flex sig was performed and hemostasis clip placed on the sigmoid colon presumably site of prior polypectomy.  Repeat EGD undertaken and Dr. Gala Romney was asked to assist emergently found a nonbleeding gastric ulcer.  In the duodenum there was a cratered ulcerated area adherent clot in the second portion measuring 25 mm with 2 areas of raised clot within the polypectomy crater consistent with visible vessel stigmata.  Heaped up margins consistent with prior report of incomplete removal and noted bleeding control therapy with hemostasis clips.  At his last visit noted no further signs of bleeding.  Taking omeprazole every other day.  Recommended that he take it daily due to history of Barrett's esophagus and he was agreeable.  Recommended colonoscopy, EGD, omeprazole 20 mg daily, avoid all NSAIDs, work toward alcohol cessation.  Colonoscopy and EGD completed 02/09/2020.  Colonoscopy found seven 47 mm polyps in the rectum, descending colon, ascending colon.  Otherwise normal.  Surgical pathology found the polyps  to be two tubular adenomas and 1 hyperplastic.  Recommended 5-year repeat exam.  EGD completed the same day found probable short segment Barrett's esophagus status post biopsy, small hiatal hernia, multiple adenomatous appearing duodenal lesion status post biopsy.  Recommended likely referral to tertiary center with duodenal EMR experience.  Surgical pathology found the biopsies to be duodenal mucosa with no significant pathologic findings.  He was referred to [facility] due to persistent/recurring duodenal adenomas with likely need for EUS/EMR.  He was agreeable.  He did see Dr. Rush Landmark at [place] who recommended cardiology evaluation, finalize plan including likely EUS/EMR.   EGD was completed 05/02/2020.  Findings included no gross lesions in esophagus, salmon-colored mucosa distally consistent with history of Barrett's.  Small hiatal hernia.  No gross lesions in the stomach.  A mucosal nodule found in the D1/D2 angle and complete removal was accomplished via piecemeal mucosal resection and avulsion with MR conditional clip placement.  No gross lesions in the duodenal bulb or D2.  Recommended no NSAIDs for 2 weeks, clear liquids today, increase omeprazole to 40 mg twice daily for a month and then 40 mg daily thereafter, plan for repeat EGD in 6 months if pathology finds adenomatous tissue and in 1 year if no adenomatous tissue.  We will be prepared for Endoroter versus hot avulsion with the next procedure if necessary.  Surgical pathology did find fragments of duodenal tubulovillous adenoma.  Plan follow-up in 6 to 8 months.  Today he states he is doing okay overall. He began having left-sided pain about 1-2 weeks ago; LUQ. Described as burning. No  identified triggers. No alleviating factors. Hasn't tried anything for it yet. Pain is sometimes moderate to severe, sometimes not bad. Has been every 5-10 minutes and then resolving. However, it hasn't hurt yet today. Not sure if he pulled a muscle or something  like that; does do a fair amount of heavy lifting. Denies N/V, hematochezia, melena, fever, chills, unintentional weight loss. If he pushes on his rib, it hurts somewhat. No frequent coughing. No constipation or diarrhea. Denies URI or flu-like symptoms. Denies loss of sense of taste or smell. The patient has not received COVID-19 vaccination(s). They are not interested in vaccine scheduling information; he is planning to get it at his pharmacy. Denies chest pain, dyspnea, dizziness, lightheadedness, syncope, near syncope. Denies any other upper or lower GI symptoms.   Past Medical History:  Diagnosis Date  . Alcohol dependence in remission ([place])   . Allergic rhinitis   . Bipolar disorder ([place])   . CAD (coronary artery disease)    Cardiac catheterization May 2021 - medical therapy recommended  . Chronic pain   . Colon polyp    Tubular adenoma  . COPD (chronic obstructive pulmonary disease) ([place])   . Gastric polyp    Tubular adenoma  . GERD (gastroesophageal reflux disease)   . Mixed hyperlipidemia     Past Surgical History:  Procedure Laterality Date  . BIOPSY  02/09/2020   Procedure: BIOPSY;  Surgeon: Daneil Dolin, MD;  [facility];  Service: Endoscopy;;  . COLONOSCOPY N/A 08/19/2016   Dr. Aviva Signs: 2 semi-pedunculated polyps in the distal sigmoid colon removed. Measure 2-5 mm. Tubular adenomas.  . COLONOSCOPY WITH PROPOFOL N/A 02/09/2020   Procedure: COLONOSCOPY WITH PROPOFOL;  Surgeon: Daneil Dolin, MD;  [facility];  Service: Endoscopy;  Laterality: N/A;  1:00pm  . ENDOSCOPIC MUCOSAL RESECTION N/A 05/02/2020   Procedure: ENDOSCOPIC MUCOSAL RESECTION;  Surgeon: Rush Landmark Telford Nab., MD;  Location: [facility];  Service: Gastroenterology;  Laterality: N/A;  . ESOPHAGOGASTRODUODENOSCOPY N/A 08/19/2016   Dr. Arnoldo Morale: Villous appearing mucosa in the first portion of the duodenum, polyp removed with hot snare, pathology tubular adenoma. Medium sized hiatal  hernia. Biopsy for CLOtest which was negative.  . ESOPHAGOGASTRODUODENOSCOPY (EGD) WITH PROPOFOL  08/20/2016   Dr. Gala Romney: assisted Dr. Arnoldo Morale emergently. Esophagus normal. Medium sized hiatal hernia. One nonbleeding cratered stellate-shaped gastric ulcer measuring 8 mm. One cratered ulcerated area with adherent clot in the second portion of the duodenum. 25 mm in diameter. Visible vessel seen. Status post bleeding control therapy. Heaped up margins consistent with incomplete removal.   . ESOPHAGOGASTRODUODENOSCOPY (EGD) WITH PROPOFOL N/A 10/27/2016   PROPOFOL;  Surgeon: Daneil Dolin, MD; Barrett's esophagus without dysplasia or malignancy.  Normal stomach with previous gastric ulcer healed.  2 duodenal polyps revealing fragments of small bowel adenoma, otherwise normal.  Recommendations to repeat endoscopy in 1 year.  . ESOPHAGOGASTRODUODENOSCOPY (EGD) WITH PROPOFOL N/A 02/09/2020   Procedure: ESOPHAGOGASTRODUODENOSCOPY (EGD) WITH PROPOFOL;  Surgeon: Daneil Dolin, MD;  [facility];  Service: Endoscopy;  Laterality: N/A;  . ESOPHAGOGASTRODUODENOSCOPY (EGD) WITH PROPOFOL N/A 05/02/2020   Procedure: ESOPHAGOGASTRODUODENOSCOPY (EGD) WITH PROPOFOL;  Surgeon: Rush Landmark Telford Nab., MD;  [facility];  Service: Gastroenterology;  Laterality: N/A;  . FLEXIBLE SIGMOIDOSCOPY N/A 08/20/2016   Dr. Arnoldo Morale: Hematin found in mid sigmoid colon. One hemostatic clip placed.  Marland Kitchen HEMOSTASIS CLIP PLACEMENT  05/02/2020   Procedure: HEMOSTASIS CLIP PLACEMENT;  Surgeon: Rush Landmark Telford Nab., MD;  [facility];  Service: Gastroenterology;;  . LEFT HEART CATH AND CORONARY ANGIOGRAPHY N/A 04/11/2020   Procedure: LEFT HEART CATH AND CORONARY ANGIOGRAPHY;  Surgeon: Sherren Mocha, MD;  Location: [facility];  Service: Cardiovascular;  Laterality: N/A;  . POLYPECTOMY  02/09/2020   Procedure: POLYPECTOMY;  Surgeon: Daneil Dolin, MD;  [facility];  Service: Endoscopy;;  .  SUBMUCOSAL LIFTING INJECTION  05/02/2020   Procedure: SUBMUCOSAL LIFTING INJECTION;  Surgeon: Irving Copas., MD;  Location: [facility];  Service: Gastroenterology;;    Current Outpatient Medications  Medication Sig Dispense Refill  . ALPRAZolam (XANAX) 1 MG tablet Take 2 mg by mouth at bedtime.     Marland Kitchen atorvastatin (LIPITOR) 40 MG tablet Take 1 tablet (40 mg total) by mouth at bedtime. 90 tablet 3  . Biotin 10000 MCG TABS Take 10,000 mcg by mouth daily.     . Calcium Carb-Cholecalciferol (CALCIUM 600/VITAMIN D3) 600-800 MG-UNIT TABS Take 1 tablet by mouth daily.    . cetirizine (ZYRTEC) 10 MG tablet Take 10 mg by mouth daily.    . cholecalciferol (VITAMIN D3) 25 MCG (1000 UT) tablet Take 1,000 Units by mouth daily.    . fenofibrate 160 MG tablet Take 160 mg by mouth at bedtime.     Marland Kitchen HYDROcodone-acetaminophen (NORCO) 10-325 MG tablet Take 1 tablet by mouth 2 (two) times daily as needed (for pain).     . magnesium oxide (MAG-OX) 400 MG tablet Take 400 mg by mouth daily.    . metoprolol succinate (TOPROL XL) 25 MG 24 hr tablet Take 1 tablet (25 mg total) by mouth daily. 90 tablet 3  . nitroGLYCERIN (NITROSTAT) 0.4 MG SL tablet Place 1 tablet (0.4 mg total) under the tongue every 5 (five) minutes as needed for chest pain. 25 tablet 3  . omeprazole (PRILOSEC) 40 MG capsule Take 1 capsule (40 mg total) by mouth daily before breakfast. 60 capsule 3  . vitamin B-12 (CYANOCOBALAMIN) 500 MCG tablet Take 500 mcg by mouth daily.    . Zinc 50 MG TABS Take 50 mg by mouth daily.      No current facility-administered medications for this visit.    Allergies as of 07/18/2020  . (No Known Allergies)    Family History  Problem Relation Age of Onset  . Cirrhosis Mother   . CAD Mother 26       Premature CAD  . Heart disease Father 22       Premature CAD  . Esophageal cancer Brother   . Unexplained death Brother 34       died in his sleep  . Colon cancer Neg Hx   . Inflammatory bowel  disease Neg Hx   . Liver disease Neg Hx   . Pancreatic cancer Neg Hx   . Stomach cancer Neg Hx     Social History   Socioeconomic History  . Marital status: Single    Spouse name: Not on file  . Number of children: 2  . Years of education: Not on file  . Highest education level: Not on file  Occupational History  . Not on file  Tobacco Use  . Smoking status: Former Smoker    Packs/day: 0.25    Years: 40.00    Pack years: 10.00    Types: Cigarettes    Quit date: 09/08/2019    Years since quitting: 0.8  . Smokeless tobacco: Never Used  Vaping Use  . Vaping Use: Never used  Substance and Sexual Activity  .  Alcohol use: Yes    Alcohol/week: 0.0 standard drinks    Comment: 3-4 beers/day   . Drug use: No  . Sexual activity: Never    Birth control/protection: None  Other Topics Concern  . Not on file  Social History Narrative  . Not on file   Social Determinants of Health   Financial Resource Strain:   . Difficulty of Paying Living Expenses:   Food Insecurity:   . Worried About Charity fundraiser in the Last Year:   . Arboriculturist in the Last Year:   Transportation Needs:   . Film/video editor (Medical):   Marland Kitchen Lack of Transportation (Non-Medical):   Physical Activity:   . Days of Exercise per Week:   . Minutes of Exercise per Session:   Stress:   . Feeling of Stress :   Social Connections:   . Frequency of Communication with Friends and Family:   . Frequency of Social Gatherings with Friends and Family:   . Attends Religious Services:   . Active Member of Clubs or Organizations:   . Attends Archivist Meetings:   Marland Kitchen Marital Status:     Subjective: Review of Systems  Constitutional: Negative for chills, fever, malaise/fatigue and weight loss.  HENT: Negative for congestion and sore throat.   Respiratory: Negative for cough and shortness of breath.   Cardiovascular: Negative for chest pain and palpitations.  Gastrointestinal: Negative for  abdominal pain, blood in stool, diarrhea, melena, nausea and vomiting.  Musculoskeletal: Negative for joint pain and myalgias.  Skin: Negative for rash.  Neurological: Negative for dizziness and weakness.  Endo/Heme/Allergies: Does not bruise/bleed easily.  Psychiatric/Behavioral: Negative for depression. The patient is not nervous/anxious.   All other systems reviewed and are negative.    Objective: BP 128/78   Pulse 71   Temp 98.5 F (36.9 C) (Oral)   Ht 6\' 3"  (1.905 m)   Wt 197 lb 12.8 oz (89.7 kg)   BMI 24.72 kg/m  Physical Exam Vitals and nursing note reviewed.  Constitutional:      General: He is not in acute distress.    Appearance: Normal appearance. He is normal weight. He is not ill-appearing, toxic-appearing or diaphoretic.  HENT:     Head: Normocephalic and atraumatic.     Nose: No congestion or rhinorrhea.  Eyes:     General: No scleral icterus. Cardiovascular:     Rate and Rhythm: Normal rate and regular rhythm.     Heart sounds: Normal heart sounds.  Pulmonary:     Effort: Pulmonary effort is normal.     Breath sounds: Normal breath sounds.  Abdominal:     General: Bowel sounds are normal. There is no distension.     Palpations: Abdomen is soft. There is no hepatomegaly, splenomegaly or mass.     Tenderness: There is no abdominal tenderness. There is no guarding or rebound.     Hernia: No hernia is present.  Musculoskeletal:     Cervical back: Neck supple.  Skin:    General: Skin is warm and dry.     Coloration: Skin is not jaundiced.     Findings: No bruising or rash.  Neurological:     General: No focal deficit present.     Mental Status: He is alert and oriented to person, place, and time. Mental status is at baseline.  Psychiatric:        Mood and Affect: Mood normal.  Behavior: Behavior normal.        Thought Content: Thought content normal.      Assessment:  Very pleasant 54 year old male who has a duodenal nodule return as per  above.  You have undergone EGD with our office in [facility] in [place] as well.  Plan repeat in 6 to 8 months (November to January).  History of GERD well managed on PPI.  He presents today with complaints of left side pain.  This is been hurting every 5 to 10 minutes for several days/week.  However, he woke up this morning pain-free and it is not begun hurting yet.  He is not sure if he pulled a muscle as he does heavy lifting.  Differentials include pulled muscle, cracked rib, persistent cough during the night, GERD flare.  I doubt any pain or complication from his EGDs it is been 2 months since these.  No constipation or diarrhea to speak of.   Plan: 1. At this point we will continue his current medications 2. He is to call us if his pain returns or gets worse 3. If his pain does become recurrent we can consider abdominal imaging such as abdominal ultrasound versus CT scan 4. We can also consider labs if needed 5. Otherwise, keep appointment with [facility] in [facility] for repeat EGD      07/18/2020 2:40 PM   Disclaimer: This note was dictated with voice recognition software. Similar sounding words can inadvertently be transcribed and may not be corrected upon review.

## 2020-07-18 NOTE — Patient Instructions (Addendum)
Your health issues we discussed today were:   Nodule on EGD: 1. Keep your follow-up with Dr. Rush Landmark in [place] 2. Further recommendations will follow his procedure and results  GERD (reflux/heartburn): 1. I do not think this is contributing substantially to her symptoms as it is been well managed on your acid blocker 2. Continue taking your current medications 3. Call us for any worsening or severe symptoms  Abdominal pain: 1. Because your pain has stopped, I will not plan to do an extensive work-up at this time 2. Keep an eye on your pain and let us know if it returns or gets worse at which point we can consider imaging and/or labs 3. Further recommendations will follow how you do at home 4. Call us for any worsening or severe symptoms  Overall I recommend:  1. Continue your other current medications 2. Return for follow-up in 6 months 3. Call us if you have any questions or concerns   At [facility] we value your feedback. You may receive a survey about your visit today. Please share your experience as we strive to create trusting relationships with our patients to provide genuine, compassionate, quality care.  We appreciate your understanding and patience as we review any laboratory studies, imaging, and other diagnostic tests that are ordered as we care for you. Our office policy is 5 business days for review of these results, and any emergent or urgent results are addressed in a timely manner for your best interest. If you do not hear from our office in 1 week, please contact us.   We also encourage the use of MyChart, which contains your medical information for your review as well. If you are not enrolled in this feature, an access code is on this after visit summary for your convenience. Thank you for allowing [place] to be involved in your care.  It was great to see you today!  I hope you have a great rest of your summer!!  Make sure you stay hydrated in the  heat!

## 2020-08-09 ENCOUNTER — Other Ambulatory Visit: Payer: Self-pay

## 2020-08-09 ENCOUNTER — Other Ambulatory Visit: Payer: 59

## 2020-08-09 DIAGNOSIS — Z20822 Contact with and (suspected) exposure to covid-19: Secondary | ICD-10-CM

## 2020-08-11 LAB — NOVEL CORONAVIRUS, NAA: SARS-CoV-2, NAA: NOT DETECTED

## 2020-08-16 ENCOUNTER — Other Ambulatory Visit: Payer: Self-pay

## 2020-08-16 ENCOUNTER — Other Ambulatory Visit: Payer: 59

## 2020-08-16 DIAGNOSIS — Z20822 Contact with and (suspected) exposure to covid-19: Secondary | ICD-10-CM

## 2020-08-18 LAB — SARS-COV-2, NAA 2 DAY TAT

## 2020-08-18 LAB — NOVEL CORONAVIRUS, NAA: SARS-CoV-2, NAA: DETECTED — AB

## 2020-08-19 ENCOUNTER — Other Ambulatory Visit: Payer: Self-pay | Admitting: Nurse Practitioner

## 2020-08-19 NOTE — Progress Notes (Signed)
COVID [facility] Contact Note   Qualifiers: CAD Symptoms: Unknown Symptom Onset: Unknown   Contacted patient to discuss symptoms and evaluate for interest and qualifications for [facility] treatment. Patient qualifies for at-risk group according to the FDA Emergency Use Authorization.   Text message sent with reason for contact and call back information for my direct contact or to leave a message with the [facility] hotline at [phone].  Worthy Keeler, DNP, AGNP-c COVID-19 [facility] [phone]

## 2020-08-20 ENCOUNTER — Encounter: Payer: Self-pay | Admitting: Nurse Practitioner

## 2020-08-20 ENCOUNTER — Other Ambulatory Visit: Payer: Self-pay | Admitting: Nurse Practitioner

## 2020-08-21 ENCOUNTER — Telehealth: Payer: Self-pay | Admitting: Oncology

## 2020-08-21 NOTE — Telephone Encounter (Signed)
Called to Discuss with patient about Covid symptoms and the use of the monoclonal antibody infusion for those with mild to moderate Covid symptoms and at a high risk of hospitalization.     Pt is qualified for this infusion due to co-morbid conditions and/or a member of an at-risk group.     Patient Active Problem List   Diagnosis Date Noted  . Abdominal pain 07/18/2020  . Duodenal nodule 04/09/2020  . History of Adenomatous duodenal polyp 04/09/2020  . Abnormal endoscopy of upper gastrointestinal tract 04/09/2020  . Abnormal cardiac CT angiography 04/06/2020  . Barrett's esophagus 11/14/2019  . History of adenomatous polyp of colon 11/14/2019  . Adenoma of duodenum 11/14/2019  . GERD (gastroesophageal reflux disease) 11/14/2019  . History of gastric ulcer 11/14/2019  . History of duodenal ulcer 11/14/2019  . Acute blood loss anemia   . Duodenal ulcer with hemorrhage   . Gastric ulceration   . Fresh blood passed per rectum 08/19/2016    Patient declines infusion at this time. Symptoms tier reviewed as well as criteria for ending isolation. Preventative practices reviewed. Patient verbalized understanding.   Today is day 10 for him and he is feeling much better.   Patient advised to call back if he/she decides that he/she does want to get infusion. Callback number to the infusion center given. Patient advised to go to Urgent care or ED with severe symptoms.   Faythe Casa, NP 08/21/2020 11:13 AM

## 2020-08-24 ENCOUNTER — Ambulatory Visit: Payer: 59 | Admitting: Physician Assistant

## 2020-08-27 ENCOUNTER — Other Ambulatory Visit: Payer: Self-pay

## 2020-08-27 ENCOUNTER — Inpatient Hospital Stay (HOSPITAL_COMMUNITY): Admit: 2020-08-27 | Payer: 59

## 2020-08-27 ENCOUNTER — Other Ambulatory Visit (HOSPITAL_COMMUNITY)
Admission: RE | Admit: 2020-08-27 | Discharge: 2020-08-27 | Disposition: A | Discharge status: Home / Self Care | Payer: 59 | Source: Ambulatory Visit | Attending: Cardiology | Admitting: Cardiology

## 2020-08-27 DIAGNOSIS — E782 Mixed hyperlipidemia: Secondary | ICD-10-CM | POA: Diagnosis present

## 2020-08-27 DIAGNOSIS — I25119 Atherosclerotic heart disease of native coronary artery with unspecified angina pectoris: Secondary | ICD-10-CM | POA: Insufficient documentation

## 2020-08-27 LAB — LIPID PANEL
Cholesterol: 183 mg/dL (ref 0–200)
HDL: 35 mg/dL — ABNORMAL LOW (ref >40)
LDL Cholesterol: 128 mg/dL — ABNORMAL HIGH (ref 0–99)
Total CHOL/HDL Ratio: 5.2 RATIO
Triglycerides: 102 mg/dL (ref <150)
VLDL: 20 mg/dL (ref 0–40)

## 2020-08-28 ENCOUNTER — Telehealth: Payer: Self-pay

## 2020-08-28 DIAGNOSIS — E782 Mixed hyperlipidemia: Secondary | ICD-10-CM

## 2020-08-28 MED ORDER — ATORVASTATIN CALCIUM 80 MG PO TABS
80.0000 mg | ORAL_TABLET | Freq: Every day | ORAL | 3 refills | Status: DC
Start: 2020-08-28 — End: 2020-08-29

## 2020-08-28 NOTE — Telephone Encounter (Signed)
Patient notified, will increase lipitor to 80 mg daily

## 2020-08-28 NOTE — Telephone Encounter (Signed)
-----   Message from Satira Sark, MD sent at 08/27/2020 11:49 AM EDT ----- Results reviewed.  LDL 128.  If he has been consistent with Lipitor at 40 mg daily, would suggest increasing to 80 mg daily.  Recheck FLP and LFTs in 12 weeks.

## 2020-08-29 ENCOUNTER — Telehealth: Payer: Self-pay

## 2020-08-29 MED ORDER — ROSUVASTATIN CALCIUM 40 MG PO TABS
40.0000 mg | ORAL_TABLET | Freq: Every day | ORAL | 3 refills | Status: DC
Start: 2020-08-29 — End: 2021-08-20

## 2020-08-29 NOTE — Telephone Encounter (Signed)
Left message to call back  

## 2020-08-29 NOTE — Telephone Encounter (Signed)
Then it would appear our chart was incorrect.  I am glad that he clarified that.  If he has been taking Lipitor 80 mg consistently and his LDL is 128, would suggest switching to Crestor 40 mg daily.

## 2020-08-29 NOTE — Telephone Encounter (Signed)
Paul Sark, MD  08/27/2020 11:49 AM EDT     Results reviewed. LDL 128. If he has been consistent with Lipitor at 40 mg daily, would suggest increasing to 80 mg daily. Recheck FLP and LFTs in 12 weeks.     Patient walked into office today stating he is already taking Lipitor 80 mg ,although he confirmed during phone call  that he was taking 40 mg daily and was instructed to take 2- 40 mg tablets until finished and then pick up his 80 mg script.

## 2020-08-30 ENCOUNTER — Ambulatory Visit: Payer: Self-pay

## 2020-08-30 NOTE — Telephone Encounter (Signed)
Left message to call back-cc 

## 2020-08-30 NOTE — Telephone Encounter (Signed)
I spoke with patient.He agrees to start Crestor 40 mg daily at dinner.E-scribed to [employer]

## 2020-10-11 ENCOUNTER — Encounter: Payer: Self-pay | Admitting: Student

## 2020-10-11 ENCOUNTER — Other Ambulatory Visit: Payer: Self-pay

## 2020-10-11 ENCOUNTER — Ambulatory Visit (INDEPENDENT_AMBULATORY_CARE_PROVIDER_SITE_OTHER): Payer: 59 | Admitting: Student

## 2020-10-11 VITALS — BP 138/78 | HR 60 | Ht 75.0 in | Wt 199.0 lb

## 2020-10-11 DIAGNOSIS — E785 Hyperlipidemia, unspecified: Secondary | ICD-10-CM

## 2020-10-11 DIAGNOSIS — K219 Gastro-esophageal reflux disease without esophagitis: Secondary | ICD-10-CM | POA: Diagnosis not present

## 2020-10-11 DIAGNOSIS — I2510 Atherosclerotic heart disease of native coronary artery without angina pectoris: Secondary | ICD-10-CM

## 2020-10-11 NOTE — Patient Instructions (Signed)
Medication Instructions:  Your physician recommends that you continue on your current medications as directed. Please refer to the Current Medication list given to you today.  *If you need a refill on your cardiac medications before your next appointment, please call your pharmacy*   Lab Work:  Fasting LIPIDS NEXT MONTH   If you have labs (blood work) drawn today and your tests are completely normal, you will receive your results only by: Marland Kitchen MyChart Message (if you have MyChart) OR . A paper copy in the mail If you have any lab test that is abnormal or we need to change your treatment, we will call you to review the results.   Testing/Procedures: None today   Follow-Up: At [facility], you and your health needs are our priority.  As part of our continuing mission to provide you with exceptional heart care, we have created designated Provider Care Teams.  These Care Teams include your primary Cardiologist (physician) and Advanced Practice Providers (APPs -  Physician Assistants and Nurse Practitioners) who all work together to provide you with the care you need, when you need it.  We recommend signing up for the patient portal called "MyChart".  Sign up information is provided on this After Visit Summary.  MyChart is used to connect with patients for Virtual Visits (Telemedicine).  Patients are able to view lab/test results, encounter notes, upcoming appointments, etc.  Non-urgent messages can be sent to your provider as well.   To learn more about what you can do with MyChart, go to [email or web address].    Your next appointment:   6 month(s)  The format for your next appointment:   In Person  Provider:   Rozann Lesches, MD   Other Instructions None      Thank you for choosing [facility] !

## 2020-10-11 NOTE — Progress Notes (Signed)
Cardiology Office Note    Date:  10/11/2020   ID:  Paul Arias, DOB [birth year], MRN [id number]  PCP:  Celene Squibb, MD  Cardiologist: Rozann Lesches, MD    Chief Complaint  Patient presents with  . Follow-up    4 month visit    History of Present Illness:    Paul Arias is a 54 y.o. male with past medical history of CAD (s/p cath in 04/2020 showing subtotal chronic occlusion of RCA with L-->R collaterals and moderate stenosis along LAD with medical management recommended), HLD, GERD and prior tobacco use who presents to the office today for 42-month follow-up.  He was last examined Dr. Domenic Polite in 05/2020 following his recent cardiac catheterization during which medical therapy was recommended as outlined above. He denied any anginal symptoms at the time of his visit. He was not on ASA at the time given recent GIB. Was restarted on Toprol-XL 25 mg daily and continued on Lipitor 40 mg daily. He was switched from Lipitor to Crestor 40 mg daily in the interim due to consistently elevated LDL (at 128 in 08/2020).  In talking with the patient today, he reports overall doing well from a cardiac perspective since his last office visit. He does report having baseline dyspnea on exertion in the setting of emphysema but denies any acute changes in this. No recent chest pain, palpitations, orthopnea, PND or lower extremity edema.  He reports good compliance with his current medication regimen but has been without Toprol-XL for several days due to needing refills. He does not report any side effects after being switched from Atorvastatin to Crestor. He did switch PCP's and is scheduled for repeat labs within the next month.    Past Medical History:  Diagnosis Date  . Alcohol dependence in remission ([place])   . Allergic rhinitis   . Bipolar disorder ([place])   . CAD (coronary artery disease)    Cardiac catheterization May 2021 - medical therapy recommended  . Chronic pain   . Colon  polyp    Tubular adenoma  . COPD (chronic obstructive pulmonary disease) ([place])   . Gastric polyp    Tubular adenoma  . GERD (gastroesophageal reflux disease)   . Mixed hyperlipidemia     Past Surgical History:  Procedure Laterality Date  . BIOPSY  02/09/2020   Procedure: BIOPSY;  Surgeon: Daneil Dolin, MD;  [facility];  Service: Endoscopy;;  . COLONOSCOPY N/A 08/19/2016   Dr. Aviva Signs: 2 semi-pedunculated polyps in the distal sigmoid colon removed. Measure 2-5 mm. Tubular adenomas.  . COLONOSCOPY WITH PROPOFOL N/A 02/09/2020   Procedure: COLONOSCOPY WITH PROPOFOL;  Surgeon: Daneil Dolin, MD;  [facility];  Service: Endoscopy;  Laterality: N/A;  1:00pm  . ENDOSCOPIC MUCOSAL RESECTION N/A 05/02/2020   Procedure: ENDOSCOPIC MUCOSAL RESECTION;  Surgeon: Rush Landmark Telford Nab., MD;  Location: [facility];  Service: Gastroenterology;  Laterality: N/A;  . ESOPHAGOGASTRODUODENOSCOPY N/A 08/19/2016   Dr. Arnoldo Morale: Villous appearing mucosa in the first portion of the duodenum, polyp removed with hot snare, pathology tubular adenoma. Medium sized hiatal hernia. Biopsy for CLOtest which was negative.  . ESOPHAGOGASTRODUODENOSCOPY (EGD) WITH PROPOFOL  08/20/2016   Dr. Gala Romney: assisted Dr. Arnoldo Morale emergently. Esophagus normal. Medium sized hiatal hernia. One nonbleeding cratered stellate-shaped gastric ulcer measuring 8 mm. One cratered ulcerated area with adherent clot in the second portion of the duodenum. 25 mm in diameter. Visible vessel seen. Status post bleeding control therapy. Heaped up margins  consistent with incomplete removal.   . ESOPHAGOGASTRODUODENOSCOPY (EGD) WITH PROPOFOL N/A 10/27/2016   PROPOFOL;  Surgeon: Daneil Dolin, MD; Barrett's esophagus without dysplasia or malignancy.  Normal stomach with previous gastric ulcer healed.  2 duodenal polyps revealing fragments of small bowel adenoma, otherwise normal.  Recommendations to repeat endoscopy in 1 year.  .  ESOPHAGOGASTRODUODENOSCOPY (EGD) WITH PROPOFOL N/A 02/09/2020   Procedure: ESOPHAGOGASTRODUODENOSCOPY (EGD) WITH PROPOFOL;  Surgeon: Daneil Dolin, MD;  [facility];  Service: Endoscopy;  Laterality: N/A;  . ESOPHAGOGASTRODUODENOSCOPY (EGD) WITH PROPOFOL N/A 05/02/2020   Procedure: ESOPHAGOGASTRODUODENOSCOPY (EGD) WITH PROPOFOL;  Surgeon: Rush Landmark Telford Nab., MD;  [facility];  Service: Gastroenterology;  Laterality: N/A;  . FLEXIBLE SIGMOIDOSCOPY N/A 08/20/2016   Dr. Arnoldo Morale: Hematin found in mid sigmoid colon. One hemostatic clip placed.  Marland Kitchen HEMOSTASIS CLIP PLACEMENT  05/02/2020   Procedure: HEMOSTASIS CLIP PLACEMENT;  Surgeon: Irving Copas., MD;  [facility];  Service: Gastroenterology;;  . LEFT HEART CATH AND CORONARY ANGIOGRAPHY N/A 04/11/2020   Procedure: LEFT HEART CATH AND CORONARY ANGIOGRAPHY;  Surgeon: Sherren Mocha, MD;  Location: [facility];  Service: Cardiovascular;  Laterality: N/A;  . POLYPECTOMY  02/09/2020   Procedure: POLYPECTOMY;  Surgeon: Daneil Dolin, MD;  [facility];  Service: Endoscopy;;  . SUBMUCOSAL LIFTING INJECTION  05/02/2020   Procedure: SUBMUCOSAL LIFTING INJECTION;  Surgeon: Irving Copas., MD;  Location: [facility];  Service: Gastroenterology;;    Current Medications: Outpatient Medications Prior to Visit  Medication Sig Dispense Refill  . ALPRAZolam (XANAX) 1 MG tablet Take 2 mg by mouth at bedtime.     . Biotin 10000 MCG TABS Take 10,000 mcg by mouth daily.     . Calcium Carb-Cholecalciferol (CALCIUM 600/VITAMIN D3) 600-800 MG-UNIT TABS Take 1 tablet by mouth daily.    . cetirizine (ZYRTEC) 10 MG tablet Take 10 mg by mouth daily.    . fenofibrate 160 MG tablet Take 160 mg by mouth at bedtime.     Marland Kitchen HYDROcodone-acetaminophen (NORCO) 10-325 MG tablet Take 1 tablet by mouth 2 (two) times daily as needed (for pain).     . magnesium oxide (MAG-OX) 400 MG tablet Take 400 mg by mouth daily.     . metoprolol succinate (TOPROL XL) 25 MG 24 hr tablet Take 1 tablet (25 mg total) by mouth daily. 90 tablet 3  . nitroGLYCERIN (NITROSTAT) 0.4 MG SL tablet Place 1 tablet (0.4 mg total) under the tongue every 5 (five) minutes as needed for chest pain. 25 tablet 3  . omeprazole (PRILOSEC) 40 MG capsule Take 1 capsule (40 mg total) by mouth daily before breakfast. 60 capsule 3  . rosuvastatin (CRESTOR) 40 MG tablet Take 1 tablet (40 mg total) by mouth daily. 90 tablet 3  . vitamin B-12 (CYANOCOBALAMIN) 500 MCG tablet Take 500 mcg by mouth daily.    . Zinc 50 MG TABS Take 50 mg by mouth daily.     . cholecalciferol (VITAMIN D3) 25 MCG (1000 UT) tablet Take 1,000 Units by mouth daily.     No facility-administered medications prior to visit.     Allergies:   Patient has no known allergies.   Social History   Socioeconomic History  . Marital status: Single    Spouse name: Not on file  . Number of children: 2  . Years of education: Not on file  . Highest education level: Not on file  Occupational History  . Not on file  Tobacco Use  . Smoking status: Former Smoker    Packs/day: 0.25    Years: 40.00    Pack years: 10.00    Types: Cigarettes    Quit date: 09/08/2019    Years since quitting: 1.0  . Smokeless tobacco: Never Used  Vaping Use  . Vaping Use: Never used  Substance and Sexual Activity  . Alcohol use: Yes    Alcohol/week: 0.0 standard drinks    Comment: 3-4 beers/day   . Drug use: No  . Sexual activity: Never    Birth control/protection: None  Other Topics Concern  . Not on file  Social History Narrative  . Not on file   Social Determinants of Health   Financial Resource Strain:   . Difficulty of Paying Living Expenses: Not on file  Food Insecurity:   . Worried About Charity fundraiser in the Last Year: Not on file  . Ran Out of Food in the Last Year: Not on file  Transportation Needs:   . Lack of Transportation (Medical): Not on file  . Lack of  Transportation (Non-Medical): Not on file  Physical Activity:   . Days of Exercise per Week: Not on file  . Minutes of Exercise per Session: Not on file  Stress:   . Feeling of Stress : Not on file  Social Connections:   . Frequency of Communication with Friends and Family: Not on file  . Frequency of Social Gatherings with Friends and Family: Not on file  . Attends Religious Services: Not on file  . Active Member of Clubs or Organizations: Not on file  . Attends Archivist Meetings: Not on file  . Marital Status: Not on file     Family History:  The patient's family history includes CAD (age of onset: 69) in his mother; Cirrhosis in his mother; Esophageal cancer in his brother; Heart disease (age of onset: 28) in his father; Unexplained death (age of onset: 37) in his brother.   Review of Systems:   Please see the history of present illness.     General:  No chills, fever, night sweats or weight changes.  Cardiovascular:  No chest pain, edema, orthopnea, palpitations, paroxysmal nocturnal dyspnea. Positive for dyspnea on exertion (at baseline).  Dermatological: No rash, lesions/masses Respiratory: No cough, dyspnea Urologic: No hematuria, dysuria Abdominal:   No nausea, vomiting, diarrhea, bright red blood per rectum, melena, or hematemesis Neurologic:  No visual changes, wkns, changes in mental status. All other systems reviewed and are otherwise negative except as noted above.   Physical Exam:    VS:  BP 138/78   Pulse 60   Ht 6\' 3"  (1.905 m)   Wt 199 lb (90.3 kg)   SpO2 95%   BMI 24.87 kg/m    General: Well developed, well nourished,male appearing in no acute distress. Head: Normocephalic, atraumatic. Neck: No carotid bruits. JVD not elevated.  Lungs: Respirations regular and unlabored, without wheezes or rales.  Heart: Regular rate and rhythm. No S3 or S4.  No murmur, no rubs, or gallops appreciated. Abdomen: Appears non-distended. No obvious abdominal  masses. Msk:  Strength and tone appear normal for age. No obvious joint deformities or effusions. Extremities: No clubbing or cyanosis. No lower extremity edema.  Distal pedal pulses are 2+ bilaterally. Neuro: Alert and oriented X 3. Moves all extremities spontaneously. No focal deficits noted. Psych:  Responds to questions appropriately with a normal affect. Skin: No rashes or lesions noted  Wt Readings from Last  3 Encounters:  10/11/20 199 lb (90.3 kg)  07/18/20 197 lb 12.8 oz (89.7 kg)  05/14/20 193 lb (87.5 kg)     Studies/Labs Reviewed:   EKG:  EKG is not ordered today.   Recent Labs: 03/26/2020: BUN 14; Creatinine, Ser 1.03; Potassium 3.8; Sodium 139 04/06/2020: Hemoglobin 13.5; Platelets 300.0   Lipid Panel    Component Value Date/Time   CHOL 183 08/27/2020 1049   TRIG 102 08/27/2020 1049   HDL 35 (L) 08/27/2020 1049   CHOLHDL 5.2 08/27/2020 1049   VLDL 20 08/27/2020 1049   LDLCALC 128 (H) 08/27/2020 1049    Additional studies/ records that were reviewed today include:   Cardiac Catheterization: 04/2020 1. 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 Moderate calcific stenosis of the proximal/mid-LAD 3. Widely patent left circumflex/OM 4. Normal LV function  Recommend: medical therapy. Consider serial stress testing to rule out progressive LAD stenosis over time.    Assessment:    1. Coronary artery disease involving native coronary artery of native heart without angina pectoris   2. Hyperlipidemia LDL goal <70   3. Gastroesophageal reflux disease, unspecified whether esophagitis present      Plan:   In order of problems listed above:  1. CAD - He is s/p cath in 04/2020 showing subtotal chronic occlusion of RCA with L-->R collaterals and moderate stenosis along LAD with medical management recommended. - He has baseline dyspnea on exertion but denies any acute changes and no recent  chest pain. - Continue current medication regimen with Toprol-XL 25 mg daily and Crestor 40 mg daily. He is not on ASA due to prior issues with GIB and reports he was informed by GI to avoid ASA and NSAIDS.   2. HLD - FLP in 08/2020 showed total cholesterol 183, triglycerides 102, HDL 35 and LDL 128. He was on Atorvastatin 80 mg daily and was transitioned to Crestor 40 mg daily. Will plan for repeat labs later this month. He is also on Fenofibrate 160 mg daily. If LDL remains above goal, would consider the addition of Zetia or referral to the [facility].  3. GERD - Remains on Omeprazole 40mg  daily.   Medication Adjustments/Labs and Tests Ordered: Current medicines are reviewed at length with the patient today.  Concerns regarding medicines are outlined above.  Medication changes, Labs and Tests ordered today are listed in the Patient Instructions below. Patient Instructions  Medication Instructions:  Your physician recommends that you continue on your current medications as directed. Please refer to the Current Medication list given to you today.  *If you need a refill on your cardiac medications before your next appointment, please call your pharmacy*   Lab Work:  Fasting LIPIDS NEXT MONTH   If you have labs (blood work) drawn today and your tests are completely normal, you will receive your results only by: Marland Kitchen MyChart Message (if you have MyChart) OR . A paper copy in the mail If you have any lab test that is abnormal or we need to change your treatment, we will call you to review the results.   Testing/Procedures: None today   Follow-Up: At [facility], you and your health needs are our priority.  As part of our continuing mission to provide you with exceptional heart care, we have created designated Provider Care Teams.  These Care Teams include your primary Cardiologist (physician) and Advanced Practice Providers (APPs -  Physician Assistants and Nurse Practitioners) who  all  work together to provide you with the care you need, when you need it.  We recommend signing up for the patient portal called "MyChart".  Sign up information is provided on this After Visit Summary.  MyChart is used to connect with patients for Virtual Visits (Telemedicine).  Patients are able to view lab/test results, encounter notes, upcoming appointments, etc.  Non-urgent messages can be sent to your provider as well.   To learn more about what you can do with MyChart, go to [email or web address].    Your next appointment:   6 month(s)  The format for your next appointment:   In Person  Provider:   Rozann Lesches, MD   Other Instructions None   Thank you for choosing [facility] !         Signed, Erma Heritage, PA-C  10/11/2020 5:05 PM    [facility] [address] [place], [place] [address] Phone: [phone] Fax: [phone]

## 2020-11-05 ENCOUNTER — Telehealth: Payer: Self-pay | Admitting: Gastroenterology

## 2020-11-07 ENCOUNTER — Other Ambulatory Visit: Payer: Self-pay

## 2020-11-07 DIAGNOSIS — K3189 Other diseases of stomach and duodenum: Secondary | ICD-10-CM

## 2020-11-07 NOTE — Telephone Encounter (Signed)
The pt has been scheduled for an appt on 12/17/20 at 845 am with Dr Rush Landmark at [facility].  COVID test on 12/13/20 at 1005 am  Left message on machine to call back

## 2020-11-07 NOTE — Telephone Encounter (Signed)
EGD scheduled, pt instructed and medications reviewed.  Patient instructions mailed to home.  Patient to call with any questions or concerns. He will call back if he has any questions or concerns

## 2020-11-07 NOTE — Telephone Encounter (Signed)
Pt returning your call

## 2020-12-07 IMAGING — CT CT HEART MORP W/ CTA COR W/ SCORE W/ CA W/CM &/OR W/O CM
1 of 18 series · 1 of 20 positions shown, 2 images · non-contrast
Comparison: 09/19/2019 lung cancer screening CT.

Addendum:
CLINICAL DATA: Hx of dyspnea on exertion. Prior ct with coronary
calcifications

EXAM:
Cardiac/Coronary  CTA
TECHNIQUE: The patient was scanned on a Siemens Somatoform go.Top scanner.

[Series 6: sa36 calcium scoring 3.00 · axial · 0.40mm/px · z∈[-1170,-1170]mm · 1 of 45 slices shown, 2 images]
[im 1/45  vessel]
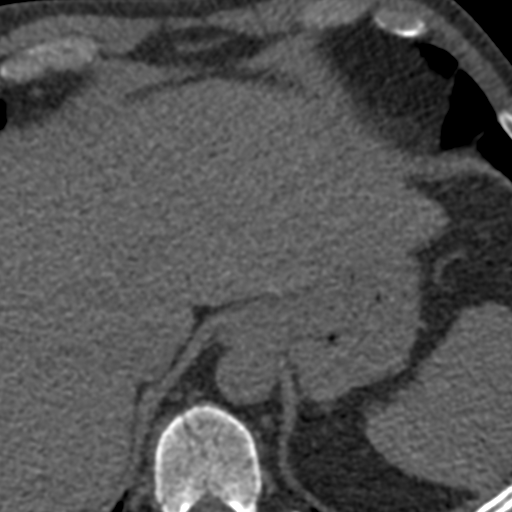
[im 1/45  lung]
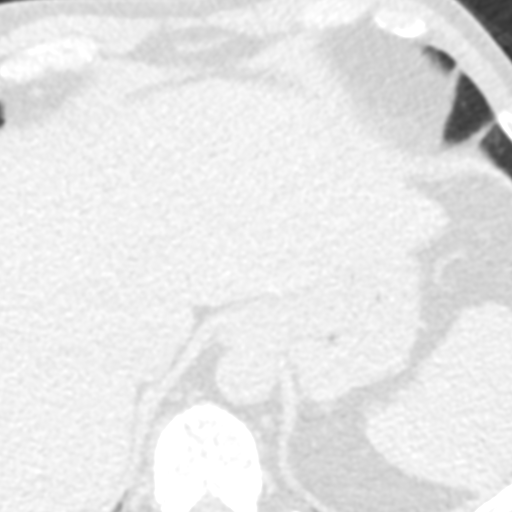

[1 of 20 positions shown; findings below may reference images not displayed]

FINDINGS: A retrospective scan was triggered in the descending thoracic aorta.
Axial non-contrast 3 mm slices were carried out through the heart.
The data set was analyzed on a dedicated work station and scored
using the Agatson method. Gantry rotation speed was 330 msecs and
collimation was .6 mm. 100mg of metoprolol po, 5mg iv metoprolol and
0.8 mg of sl NTG was given. The 3D data set was reconstructed in 5%
intervals of the 50-95 % of the R-R cycle. Diastolic phases were
analyzed on a dedicated work station using MPR, MIP and VRT modes.
The patient received 100 cc of contrast.

Aorta:  Normal size.  No calcifications.  No dissection.

Aortic Valve:  Trileaflet.  No calcifications.

Coronary Arteries:  Normal coronary origin.  Right dominance.

RCA is a large dominant artery that gives rise to PDA and PLA. There
is heavy calcified plaque in the proximal/ostial RCA causing severe
stenosis (70-99%).

Left main is a large artery that gives rise to LAD and LCX arteries.

LAD is a large vessel that has heavy calcified plaque in the
proximal to mid vessel. There is mild stenosis in the proximal LAD
and severe stenosis in the mid LAD (70-99%)

LCX is a non-dominant artery that gives rise to an OM1 branch. There
is heavily calcified plaque in the proximal LCx causing at least
moderate stenosis (50-69%).

Other findings:

Normal pulmonary vein drainage into the left atrium.

Normal left atrial appendage without a thrombus.

Normal size of the pulmonary artery.
IMPRESSION: 1. Coronary calcium score of 8050. This was 99th percentile for age
and sex matched control.

2. Normal coronary origin with right dominance.

3. Heavily calcified coronary arteries, causing severe stenosis in
the proximal/ostial RCA and mid LAD (70-99%).

4. Heavily calcified plaque in the proximal LCx causing atleast
moderate stenosis.

5. CAD-RADS 4 Severe stenosis. (70-99% or > 50% left main). Cardiac
catheterization or CT FFR is recommended. Consider symptom-guided
anti-ischemic pharmacotherapy as well as risk factor modification
per guideline directed care. Additional analysis with CT FFR will be
submitted and reported separately.

ADDENDUM:
OVER-READ INTERPRETATION  CT CHEST

The following report is an over-read performed by radiologist Dr.
does not include interpretation of cardiac or coronary anatomy or
pathology. The coronary CTA interpretation by the cardiologist is
attached.
FINDINGS: No pleural fluid. Dependent ground-glass in both lung bases is
likely due to subsegmental atelectasis.

Normal aortic caliber. No central pulmonary embolism, on this
non-dedicated study.

No imaged thoracic adenopathy.

Normal imaged portions of the liver, spleen, stomach.

No acute osseous abnormality.  Mid and lower thoracic spondylosis.
IMPRESSION: No acute findings in the imaged extracardiac chest.

*** End of Addendum ***
FINDINGS: A retrospective scan was triggered in the descending thoracic aorta.
Axial non-contrast 3 mm slices were carried out through the heart.
The data set was analyzed on a dedicated work station and scored
using the Agatson method. Gantry rotation speed was 330 msecs and
collimation was .6 mm. 100mg of metoprolol po, 5mg iv metoprolol and
0.8 mg of sl NTG was given. The 3D data set was reconstructed in 5%
intervals of the 50-95 % of the R-R cycle. Diastolic phases were
analyzed on a dedicated work station using MPR, MIP and VRT modes.
The patient received 100 cc of contrast.

Aorta:  Normal size.  No calcifications.  No dissection.

Aortic Valve:  Trileaflet.  No calcifications.

Coronary Arteries:  Normal coronary origin.  Right dominance.

RCA is a large dominant artery that gives rise to PDA and PLA. There
is heavy calcified plaque in the proximal/ostial RCA causing severe
stenosis (70-99%).

Left main is a large artery that gives rise to LAD and LCX arteries.

LAD is a large vessel that has heavy calcified plaque in the
proximal to mid vessel. There is mild stenosis in the proximal LAD
and severe stenosis in the mid LAD (70-99%)

LCX is a non-dominant artery that gives rise to an OM1 branch. There
is heavily calcified plaque in the proximal LCx causing at least
moderate stenosis (50-69%).

Other findings:

Normal pulmonary vein drainage into the left atrium.

Normal left atrial appendage without a thrombus.

Normal size of the pulmonary artery.
IMPRESSION: 1. Coronary calcium score of 8050. This was 99th percentile for age
and sex matched control.

2. Normal coronary origin with right dominance.

3. Heavily calcified coronary arteries, causing severe stenosis in
the proximal/ostial RCA and mid LAD (70-99%).

4. Heavily calcified plaque in the proximal LCx causing atleast
moderate stenosis.

5. CAD-RADS 4 Severe stenosis. (70-99% or > 50% left main). Cardiac
catheterization or CT FFR is recommended. Consider symptom-guided
anti-ischemic pharmacotherapy as well as risk factor modification
per guideline directed care. Additional analysis with CT FFR will be
submitted and reported separately.

## 2020-12-13 ENCOUNTER — Other Ambulatory Visit (HOSPITAL_COMMUNITY)
Admission: RE | Admit: 2020-12-13 | Discharge: 2020-12-13 | Disposition: A | Discharge status: Home / Self Care | Payer: 59 | Source: Ambulatory Visit | Attending: Gastroenterology | Admitting: Gastroenterology

## 2020-12-13 DIAGNOSIS — Z01812 Encounter for preprocedural laboratory examination: Secondary | ICD-10-CM | POA: Insufficient documentation

## 2020-12-13 DIAGNOSIS — Z20822 Contact with and (suspected) exposure to covid-19: Secondary | ICD-10-CM | POA: Insufficient documentation

## 2020-12-13 LAB — SARS CORONAVIRUS 2 (TAT 6-24 HRS): SARS Coronavirus 2: NEGATIVE

## 2020-12-14 ENCOUNTER — Encounter (HOSPITAL_COMMUNITY): Payer: Self-pay | Admitting: Gastroenterology

## 2020-12-17 ENCOUNTER — Ambulatory Visit (HOSPITAL_COMMUNITY): Payer: 59 | Admitting: Anesthesiology

## 2020-12-17 ENCOUNTER — Ambulatory Visit (HOSPITAL_COMMUNITY)
Admission: RE | Admit: 2020-12-17 | Discharge: 2020-12-17 | Disposition: A | Discharge status: Home / Self Care | Payer: 59 | Attending: Gastroenterology | Admitting: Gastroenterology

## 2020-12-17 ENCOUNTER — Encounter (HOSPITAL_COMMUNITY)
Admission: RE | Disposition: A | Discharge status: Home / Self Care | Payer: Self-pay | Source: Home / Self Care | Attending: Gastroenterology

## 2020-12-17 ENCOUNTER — Encounter (HOSPITAL_COMMUNITY): Payer: Self-pay | Admitting: Gastroenterology

## 2020-12-17 ENCOUNTER — Other Ambulatory Visit: Payer: Self-pay

## 2020-12-17 ENCOUNTER — Telehealth: Payer: Self-pay | Admitting: Gastroenterology

## 2020-12-17 DIAGNOSIS — Z87891 Personal history of nicotine dependence: Secondary | ICD-10-CM | POA: Diagnosis not present

## 2020-12-17 DIAGNOSIS — Z86010 Personal history of colonic polyps: Secondary | ICD-10-CM | POA: Insufficient documentation

## 2020-12-17 DIAGNOSIS — Z801 Family history of malignant neoplasm of trachea, bronchus and lung: Secondary | ICD-10-CM | POA: Diagnosis not present

## 2020-12-17 DIAGNOSIS — K317 Polyp of stomach and duodenum: Secondary | ICD-10-CM | POA: Diagnosis present

## 2020-12-17 DIAGNOSIS — K2289 Other specified disease of esophagus: Secondary | ICD-10-CM | POA: Insufficient documentation

## 2020-12-17 DIAGNOSIS — K3189 Other diseases of stomach and duodenum: Secondary | ICD-10-CM

## 2020-12-17 DIAGNOSIS — K449 Diaphragmatic hernia without obstruction or gangrene: Secondary | ICD-10-CM | POA: Diagnosis not present

## 2020-12-17 DIAGNOSIS — K2270 Barrett's esophagus without dysplasia: Secondary | ICD-10-CM

## 2020-12-17 HISTORY — PX: BIOPSY: SHX5522

## 2020-12-17 HISTORY — PX: ESOPHAGOGASTRODUODENOSCOPY (EGD) WITH PROPOFOL: SHX5813

## 2020-12-17 HISTORY — DX: Unspecified osteoarthritis, unspecified site: M19.90

## 2020-12-17 HISTORY — DX: Essential (primary) hypertension: I10

## 2020-12-17 HISTORY — PX: POLYPECTOMY: SHX5525

## 2020-12-17 SURGERY — ESOPHAGOGASTRODUODENOSCOPY (EGD) WITH PROPOFOL
Anesthesia: Monitor Anesthesia Care

## 2020-12-17 MED ORDER — FENTANYL CITRATE (PF) 100 MCG/2ML IJ SOLN: 25.0000 ug | INTRAMUSCULAR | Status: DC | PRN

## 2020-12-17 MED ORDER — ONDANSETRON HCL 4 MG/2ML IJ SOLN: 4.0000 mg | Freq: Four times a day (QID) | INTRAMUSCULAR | Status: DC | PRN

## 2020-12-17 MED ORDER — PROPOFOL 500 MG/50ML IV EMUL
INTRAVENOUS | Status: DC | PRN
  Administered 2020-12-17: 150 ug/kg/min via INTRAVENOUS

## 2020-12-17 MED ORDER — OXYCODONE HCL 5 MG PO TABS: 5.0000 mg | ORAL_TABLET | Freq: Once | ORAL | Status: DC | PRN

## 2020-12-17 MED ORDER — LIDOCAINE 2% (20 MG/ML) 5 ML SYRINGE
INTRAMUSCULAR | Status: DC | PRN
  Administered 2020-12-17: 60 mg via INTRAVENOUS

## 2020-12-17 MED ORDER — SODIUM CHLORIDE 0.9 % IV SOLN: INTRAVENOUS | Status: DC

## 2020-12-17 MED ORDER — PROPOFOL 10 MG/ML IV BOLUS
INTRAVENOUS | Status: DC | PRN
  Administered 2020-12-17: 50 mg via INTRAVENOUS
  Administered 2020-12-17: 30 mg via INTRAVENOUS
  Administered 2020-12-17 (×3): 20 mg via INTRAVENOUS

## 2020-12-17 MED ORDER — LACTATED RINGERS IV SOLN: Freq: Once | INTRAVENOUS | Status: AC

## 2020-12-17 MED ORDER — LACTATED RINGERS IV SOLN: INTRAVENOUS | Status: DC | PRN

## 2020-12-17 MED ORDER — OXYCODONE HCL 5 MG/5ML PO SOLN: 5.0000 mg | Freq: Once | ORAL | Status: DC | PRN

## 2020-12-17 MED ORDER — OMEPRAZOLE 40 MG PO CPDR: 40.0000 mg | DELAYED_RELEASE_CAPSULE | Freq: Two times a day (BID) | ORAL | 3 refills | Status: DC

## 2020-12-17 SURGICAL SUPPLY — 14 items

## 2020-12-17 NOTE — Op Note (Signed)
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 Tilden Dome, RN, Ladona Ridgel, Technician Referring MD:             Norvel Richards, MD, Edwinna Areola. Nevada Crane MD Medicines:                Monitored Anesthesia Care Complications:            No immediate complications. Estimated Blood Loss:     Estimated blood loss was minimal. Procedure:                Pre-Anesthesia Assessment:                           - Prior to the procedure, a History and Physical                            was performed, and patient medications and                            allergies were reviewed. The patient's tolerance of                            previous anesthesia was also reviewed. The risks                            and benefits of the procedure and the sedation                            options and risks were discussed with the patient.                            All questions were answered, and informed consent                            was obtained. Prior Anticoagulants: The patient has  taken no previous anticoagulant or antiplatelet                            agents. ASA Grade Assessment: II - A patient with                            mild systemic disease. After reviewing the risks                            and benefits, the patient was deemed in                            satisfactory condition to undergo the procedure.                            After obtaining informed consent, the endoscope was                            passed under direct vision. Throughout the                            procedure, the patient's blood pressure, pulse, and                            oxygen saturations were monitored continuously. The                            [id number] ([id number]) Olympus therapeutic                            gastroscope was introduced through the mouth, and                            advanced to the second part of duodenum. The upper                            GI endoscopy was accomplished without difficulty.                            The patient tolerated the procedure. The [id number]                            ([id number]) Olympus Duodenoscope was introduced                            through the mouth, and advanced to the second part                            of duodenum. Scope In: Scope Out: Findings:      No gross lesions were noted in the proximal esophagus and in the mid       esophagus.      There were esophageal mucosal changes (islands scattered) consistent       with short-segment Barrett's esophagus present in the distal esophagus  at 41 cm.      A 2 cm hiatal hernia was present.      Striped mildly erythematous mucosa without bleeding was found in the       gastric antrum.      No other gross lesions were noted in the entire examined stomach.       Biopsies were taken with a cold forceps for histology and Helicobacter       pylori testing.      A medium post mucosectomy scar was found in the first portion of the       duodenum and in the second portion of the duodenum. There was a very       small area of polypoid tissue - query recurrence vs healing. This was       removed with a snare for histology purposes to ensure no evidence of       recurrent adenoma.      No gross lesions were noted in the second portion of the duodenum and in       the major papilla. Impression:                - No gross lesions in esophagus proximally.                            Esophageal mucosal changes consistent with                            short-segment Barrett's esophagus.                           - 2 cm hiatal hernia.                           - Erythematous mucosa in the antrum. No other gross                            lesions in the stomach. Biopsied.                           - Duodenal scar from prior EMR, small area of                            possible recurrence. Removed and biopsied entire                            area today.                           - No gross lesions in the second portion of the                            duodenum and in the major papilla. Recommendation:           - The patient will be observed post-procedure,                            until all discharge criteria are met.                           -  Discharge patient to home.                           - Patient has a contact number available for                            emergencies. The signs and symptoms of potential                            delayed complications were discussed with the                            patient. Return to normal activities tomorrow.                            Written discharge instructions were provided to the                            patient.                           - Resume previous diet.                           - Increase Omeprazole to 40 mg twice daily for next                            28-month then may decrase back to 40 mg daily.                           - Observe patient's clinical course.                           - Await pathology results.                           - Repeat upper endoscopy within 1-year if                            adenomatous tissue found otherwise repeat in 1-2                            years if no adenomatous tissue is found for                            surveillance.                           - The findings and  recommendations were discussed                            with the patient.                           - The findings and recommendations were discussed  with the patient's family. Procedure Code(s):        --- Professional ---                           [phone], Esophagogastroduodenoscopy, flexible,                            transoral; with removal of tumor(s), polyp(s), or                            other lesion(s) by snare technique Diagnosis Code(s):        --- Professional ---                           K22.8, Other specified diseases of esophagus                           K44.9, Diaphragmatic hernia without obstruction or                            gangrene                           K31.89, Other diseases of stomach and duodenum                           K31.7, Polyp of stomach and duodenum CPT copyright 2019 [employer] Association. All rights reserved. The codes documented in this report are preliminary and upon coder review may  be revised to meet current compliance requirements. Justice Britain, MD 12/17/2020 9:47:22 AM Number of Addenda: 0

## 2020-12-17 NOTE — H&P (Signed)
GASTROENTEROLOGY PROCEDURE H&P NOTE   Primary Care Physician: Celene Squibb, MD  HPI: Paul Arias is a 55 y.o. male who presents for EGD in follow up of prior TVA s/p resection years prior with recurrence and repeat resection in 2021.  Past Medical History:  Diagnosis Date  . Alcohol dependence in remission ([place])   . Allergic rhinitis   . Arthritis    hands  . Bipolar disorder ([place])   . CAD (coronary artery disease)    Cardiac catheterization May 2021 - medical therapy recommended  . Chronic pain   . Colon polyp    Tubular adenoma  . COPD (chronic obstructive pulmonary disease) ([place])   . Gastric polyp    Tubular adenoma  . GERD (gastroesophageal reflux disease)   . Hypertension   . Mixed hyperlipidemia    Past Surgical History:  Procedure Laterality Date  . BIOPSY  02/09/2020   Procedure: BIOPSY;  Surgeon: Daneil Dolin, MD;  [facility];  Service: Endoscopy;;  . COLONOSCOPY N/A 08/19/2016   Dr. Aviva Signs: 2 semi-pedunculated polyps in the distal sigmoid colon removed. Measure 2-5 mm. Tubular adenomas.  . COLONOSCOPY WITH PROPOFOL N/A 02/09/2020   Procedure: COLONOSCOPY WITH PROPOFOL;  Surgeon: Daneil Dolin, MD;  [facility];  Service: Endoscopy;  Laterality: N/A;  1:00pm  . ENDOSCOPIC MUCOSAL RESECTION N/A 05/02/2020   Procedure: ENDOSCOPIC MUCOSAL RESECTION;  Surgeon: Rush Landmark Telford Nab., MD;  Location: [facility];  Service: Gastroenterology;  Laterality: N/A;  . ESOPHAGOGASTRODUODENOSCOPY N/A 08/19/2016   Dr. Arnoldo Morale: Villous appearing mucosa in the first portion of the duodenum, polyp removed with hot snare, pathology tubular adenoma. Medium sized hiatal hernia. Biopsy for CLOtest which was negative.  . ESOPHAGOGASTRODUODENOSCOPY (EGD) WITH PROPOFOL  08/20/2016   Dr. Gala Romney: assisted Dr. Arnoldo Morale emergently. Esophagus normal. Medium sized hiatal hernia. One nonbleeding cratered stellate-shaped gastric ulcer measuring 8 mm. One cratered  ulcerated area with adherent clot in the second portion of the duodenum. 25 mm in diameter. Visible vessel seen. Status post bleeding control therapy. Heaped up margins consistent with incomplete removal.   . ESOPHAGOGASTRODUODENOSCOPY (EGD) WITH PROPOFOL N/A 10/27/2016   PROPOFOL;  Surgeon: Daneil Dolin, MD; Barrett's esophagus without dysplasia or malignancy.  Normal stomach with previous gastric ulcer healed.  2 duodenal polyps revealing fragments of small bowel adenoma, otherwise normal.  Recommendations to repeat endoscopy in 1 year.  . ESOPHAGOGASTRODUODENOSCOPY (EGD) WITH PROPOFOL N/A 02/09/2020   Procedure: ESOPHAGOGASTRODUODENOSCOPY (EGD) WITH PROPOFOL;  Surgeon: Daneil Dolin, MD;  [facility];  Service: Endoscopy;  Laterality: N/A;  . ESOPHAGOGASTRODUODENOSCOPY (EGD) WITH PROPOFOL N/A 05/02/2020   Procedure: ESOPHAGOGASTRODUODENOSCOPY (EGD) WITH PROPOFOL;  Surgeon: Rush Landmark Telford Nab., MD;  [facility];  Service: Gastroenterology;  Laterality: N/A;  . FLEXIBLE SIGMOIDOSCOPY N/A 08/20/2016   Dr. Arnoldo Morale: Hematin found in mid sigmoid colon. One hemostatic clip placed.  Marland Kitchen HEMOSTASIS CLIP PLACEMENT  05/02/2020   Procedure: HEMOSTASIS CLIP PLACEMENT;  Surgeon: Irving Copas., MD;  [facility];  Service: Gastroenterology;;  . LEFT HEART CATH AND CORONARY ANGIOGRAPHY N/A 04/11/2020   Procedure: LEFT HEART CATH AND CORONARY ANGIOGRAPHY;  Surgeon: Sherren Mocha, MD;  Location: [place];  Service: Cardiovascular;  Laterality: N/A;  . POLYPECTOMY  02/09/2020   Procedure: POLYPECTOMY;  Surgeon: Daneil Dolin, MD;  [facility];  Service: Endoscopy;;  . SUBMUCOSAL LIFTING INJECTION  05/02/2020   Procedure: SUBMUCOSAL LIFTING INJECTION;  Surgeon: Irving Copas.,  MD;  [facility];  Service: Gastroenterology;;   Current Facility-Administered Medications  Medication Dose Route Frequency Provider Last Rate Last Admin   . 0.9 %  sodium chloride infusion   Intravenous Continuous Mansouraty, Telford Nab., MD       No Known Allergies Family History  Problem Relation Age of Onset  . Cirrhosis Mother   . CAD Mother 10       Premature CAD  . Heart disease Father 54       Premature CAD  . Esophageal cancer Brother   . Unexplained death Brother 87       died in his sleep  . Colon cancer Neg Hx   . Inflammatory bowel disease Neg Hx   . Liver disease Neg Hx   . Pancreatic cancer Neg Hx   . Stomach cancer Neg Hx    Social History   Socioeconomic History  . Marital status: Single    Spouse name: Not on file  . Number of children: 2  . Years of education: Not on file  . Highest education level: Not on file  Occupational History  . Not on file  Tobacco Use  . Smoking status: Former Smoker    Packs/day: 0.25    Years: 40.00    Pack years: 10.00    Types: Cigarettes    Quit date: 09/08/2019    Years since quitting: 1.2  . Smokeless tobacco: Never Used  Vaping Use  . Vaping Use: Never used  Substance and Sexual Activity  . Alcohol use: Yes    Alcohol/week: 32.0 standard drinks    Types: 32 Cans of beer per week  . Drug use: No  . Sexual activity: Never    Birth control/protection: None  Other Topics Concern  . Not on file  Social History Narrative  . Not on file   Social Determinants of Health   Financial Resource Strain: Not on file  Food Insecurity: Not on file  Transportation Needs: Not on file  Physical Activity: Not on file  Stress: Not on file  Social Connections: Not on file  Intimate Partner Violence: Not on file    Physical Exam: Vital signs in last 24 hours: Temp:  [98.3 F (36.8 C)] 98.3 F (36.8 C) (01/10 0737) Pulse Rate:  [60] 60 (01/10 0737) Resp:  [10] 10 (01/10 0737) BP: (142)/(73) 142/73 (01/10 0737) SpO2:  [96 %] 96 % (01/10 0737) Weight:  [91.6 kg] 91.6 kg (01/10 0737)   GEN: NAD EYE: Sclerae anicteric ENT: MMM CV: Non-tachycardic GI: Soft,  NT/ND NEURO:  Alert & Oriented x 3  Lab Results: No results for input(s): WBC, HGB, HCT, PLT in the last 72 hours. BMET No results for input(s): NA, K, CL, CO2, GLUCOSE, BUN, CREATININE, CALCIUM in the last 72 hours. LFT No results for input(s): PROT, ALBUMIN, AST, ALT, ALKPHOS, BILITOT, BILIDIR, IBILI in the last 72 hours. PT/INR No results for input(s): LABPROT, INR in the last 72 hours.   Impression / Plan: This is a 55 y.o.male who presents for EGD in follow up of prior TVA s/p resection years prior with recurrence and repeat resection in 2021.  The risks and benefits of endoscopic evaluation were discussed with the patient; these include but are not limited to the risk of perforation, infection, bleeding, missed lesions, lack of diagnosis, severe illness requiring hospitalization, as well as anesthesia and sedation related illnesses.  The patient is agreeable to proceed.    Justice Britain, MD [facility] Advanced  Endoscopy Office # [phone]

## 2020-12-17 NOTE — Anesthesia Procedure Notes (Signed)
Procedure Name: MAC Date/Time: 12/17/2020 8:21 AM Performed by: Trinna Post., CRNA Pre-anesthesia Checklist: Patient identified, Emergency Drugs available, Suction available, Patient being monitored and Timeout performed Patient Re-evaluated:Patient Re-evaluated prior to induction Oxygen Delivery Method: Nasal cannula Preoxygenation: Pre-oxygenation with 100% oxygen Induction Type: IV induction Placement Confirmation: positive ETCO2

## 2020-12-17 NOTE — Anesthesia Preprocedure Evaluation (Signed)
Anesthesia Evaluation  Patient identified by MRN, date of birth, ID band Patient awake    Reviewed: Allergy & Precautions, H&P , NPO status , Patient's Chart, lab work & pertinent test results  Airway Mallampati: II   Neck ROM: full    Dental   Pulmonary COPD, former smoker,    breath sounds clear to auscultation       Cardiovascular hypertension, + CAD   Rhythm:regular Rate:Normal  Medically managed CAD   Neuro/Psych PSYCHIATRIC DISORDERS Bipolar Disorder    GI/Hepatic PUD, GERD  ,  Endo/Other    Renal/GU      Musculoskeletal  (+) Arthritis ,   Abdominal   Peds  Hematology   Anesthesia Other Findings   Reproductive/Obstetrics                             Anesthesia Physical Anesthesia Plan  ASA: II  Anesthesia Plan: MAC   Post-op Pain Management:    Induction: Intravenous  PONV Risk Score and Plan: 1 and Propofol infusion and Treatment may vary due to age or medical condition  Airway Management Planned: Nasal Cannula  Additional Equipment:   Intra-op Plan:   Post-operative Plan:   Informed Consent: I have reviewed the patients History and Physical, chart, labs and discussed the procedure including the risks, benefits and alternatives for the proposed anesthesia with the patient or authorized representative who has indicated his/her understanding and acceptance.       Plan Discussed with: CRNA, Anesthesiologist and Surgeon  Anesthesia Plan Comments:         Anesthesia Quick Evaluation

## 2020-12-17 NOTE — Telephone Encounter (Signed)
Dr Rush Landmark per your conversation with the pt he would like you to call his sister at the provided number to discuss procedure today.

## 2020-12-17 NOTE — Telephone Encounter (Signed)
Called and spoke with patient's sister this afternoon to give her results of today's EGD. Thank you. GM

## 2020-12-17 NOTE — Transfer of Care (Signed)
Immediate Anesthesia Transfer of Care Note  Patient: Paul Arias  Procedure(s) Performed: ESOPHAGOGASTRODUODENOSCOPY (EGD) WITH PROPOFOL (N/A ) BIOPSY  Patient Location: PACU  Anesthesia Type:MAC  Level of Consciousness: drowsy and patient cooperative  Airway & Oxygen Therapy: Patient Spontanous Breathing and Patient connected to nasal cannula oxygen  Post-op Assessment: Report given to RN and Post -op Vital signs reviewed and stable  Post vital signs: Reviewed and stable  Last Vitals:  Vitals Value Taken Time  BP 106/82 12/17/20 0927  Temp    Pulse 60 12/17/20 0926  Resp 15 12/17/20 0926  SpO2 100 % 12/17/20 0926  Vitals shown include unvalidated device data.  Last Pain:  Vitals:   12/17/20 0737  TempSrc: Oral  PainSc: 0-No pain         Complications: No complications documented.

## 2020-12-18 ENCOUNTER — Other Ambulatory Visit: Payer: Self-pay | Admitting: Physician Assistant

## 2020-12-18 LAB — SURGICAL PATHOLOGY

## 2020-12-18 NOTE — Anesthesia Postprocedure Evaluation (Signed)
Anesthesia Post Note  Patient: Paul Arias  Procedure(s) Performed: ESOPHAGOGASTRODUODENOSCOPY (EGD) WITH PROPOFOL (N/A ) BIOPSY POLYPECTOMY     Patient location during evaluation: Endoscopy Anesthesia Type: MAC Level of consciousness: awake and alert Pain management: pain level controlled Vital Signs Assessment: post-procedure vital signs reviewed and stable Respiratory status: spontaneous breathing, nonlabored ventilation, respiratory function stable and patient connected to nasal cannula oxygen Cardiovascular status: stable and blood pressure returned to baseline Postop Assessment: no apparent nausea or vomiting Anesthetic complications: no   No complications documented.  Last Vitals:  Vitals:   12/17/20 0940 12/17/20 0953  BP: (!) 135/99   Pulse: 66 (!) 58  Resp: 16 13  Temp:    SpO2: 99% 100%    Last Pain:  Vitals:   12/17/20 0953  TempSrc:   PainSc: 0-No pain                 Jesicca Dipierro S

## 2020-12-19 ENCOUNTER — Encounter: Payer: Self-pay | Admitting: Gastroenterology

## 2020-12-26 ENCOUNTER — Telehealth: Payer: Self-pay | Admitting: Cardiology

## 2020-12-26 NOTE — Telephone Encounter (Signed)
Dr. Nevada Crane gave patient samples of Icosapent cap 1gm to help regulate his triglycerides. They called in prescription and his insurance has denied it based on his cardiac history.Patient would like Dr. Domenic Polite to review his chart to see if he can take this medication or recommend another one.

## 2020-12-27 NOTE — Telephone Encounter (Signed)
Notified patient of Dr. Myles Gip recommendations. Patient verbalized understanding and said that he would contact his PCP to determine the next step he should take.

## 2020-12-27 NOTE — Telephone Encounter (Signed)
Those are basically omega-3 supplements which can be obtained over-the-counter in generic form as well.  Not certain why insurance company would deny this except for cost.  I also do not know what his recent triglyceride levels are so it is hard to make other recommendations.  His last was only mildly increased in the 250s.  He was on fenofibrate and Lipitor previously.  Would probably be best for him to reinterface with Dr. Nevada Crane in their pharmacist to determine next step, might be reasonable just to go with generic omega-3 supplement.

## 2021-01-22 ENCOUNTER — Ambulatory Visit: Payer: 59 | Admitting: Nurse Practitioner

## 2021-01-22 ENCOUNTER — Encounter: Payer: Self-pay | Admitting: Nurse Practitioner

## 2021-01-22 ENCOUNTER — Other Ambulatory Visit: Payer: Self-pay

## 2021-01-22 VITALS — BP 148/87 | HR 70 | Temp 96.8°F | Ht 75.0 in | Wt 205.6 lb

## 2021-01-22 DIAGNOSIS — D132 Benign neoplasm of duodenum: Secondary | ICD-10-CM

## 2021-01-22 DIAGNOSIS — K219 Gastro-esophageal reflux disease without esophagitis: Secondary | ICD-10-CM | POA: Diagnosis not present

## 2021-01-22 DIAGNOSIS — Z86010 Personal history of colonic polyps: Secondary | ICD-10-CM | POA: Diagnosis not present

## 2021-01-22 DIAGNOSIS — K2270 Barrett's esophagus without dysplasia: Secondary | ICD-10-CM | POA: Diagnosis not present

## 2021-01-22 NOTE — Progress Notes (Signed)
Referring Provider: Lemmie Evens, MD Primary Care Physician:  Celene Squibb, MD Primary GI:  Dr. Gala Romney  Chief Complaint  Patient presents with  . Gastroesophageal Reflux    Doing ok    HPI:   [facility] is a 55 y.o. male who presents for follow-up on abdominal pain and GERD.  Patient was last seen in our office 07/18/2020 for GERD, history of adenomatous duodenal polyp, abdominal pain.  Noted chronic history of GERD, Barrett's esophagus, gastric ulcer, duodenal ulcer.  History of alcohol abuse.  Colonoscopy up-to-date 2021 with 7 tubular adenoma polyps with recommended 5-year repeat (2026).  EGD at the same time found probable short segment Barrett's and multiple adenomatous appearing duodenal lesion status post biopsy with recommended referral likely to tertiary center with duodenal EMR experience.  He was referred to come in for this.  He saw Dr. Stefani Dama Roddy at [facility].   EGD in [place] completed 05/02/2020 which found consistent with Barrett's esophagus, mucosal nodule in the D1/D2 angle and complete removal accomplished via piecemeal mucosal resection and avulsion with MR conditional clip placement.  Recommended no NSAIDs for 2 weeks, increase omeprazole to 40 mg twice daily for a month and 40 mg daily thereafter.  Repeat EGD in 6 months if pathology finds adenomatous tissue in 1 year if no adenomatous tissue.  Surgical pathology did find fragments of duodenal tubulovillous adenoma and recommended a repeat EGD in 6 to 8 months.  Repeat EGD completed 12/17/2020 which found duodenal scar from prior EMR, small area of possible recurrence removed and biopsied, no gross lesions in the 2nd duodenum or major papilla.  Increase omeprazole to 40 mg twice daily for 1 month then back down to 40 mg daily.  Repeat endoscopy within 1 year if adenomatous tissue found otherwise 1 to 2 years if no adenomatous tissue found.  Surgical pathology found 1 fragment of duodenal tubular adenoma.   Recommended repeat in 1 year (2023).  Today states doing okay overall. GERD doing well, no breakthrough. Takes omeprazole 40 mg bid. Denies abdominal pain, N/V, hematochezia, melena, fever, chills, unintentional weight loss. Denies URI or flu-like symptoms. Denies loss of sense of taste or smell. The patient has not received COVID-19 vaccination(s). They are not interested in vaccine scheduling information. Denies chest pain, dyspnea, dizziness, lightheadedness, syncope, near syncope. Denies any other upper or lower GI symptoms.  Past Medical History:  Diagnosis Date  . Alcohol dependence in remission ([place])   . Allergic rhinitis   . Arthritis    hands  . Bipolar disorder ([place])   . CAD (coronary artery disease)    Cardiac catheterization May 2021 - medical therapy recommended  . Chronic pain   . Colon polyp    Tubular adenoma  . COPD (chronic obstructive pulmonary disease) ([place])   . Gastric polyp    Tubular adenoma  . GERD (gastroesophageal reflux disease)   . Hypertension   . Mixed hyperlipidemia     Past Surgical History:  Procedure Laterality Date  . BIOPSY  02/09/2020   Procedure: BIOPSY;  Surgeon: Daneil Dolin, MD;  [facility];  Service: Endoscopy;;  . BIOPSY  12/17/2020   Procedure: BIOPSY;  Surgeon: Irving Copas., MD;  Location: [facility];  Service: Gastroenterology;;  . COLONOSCOPY N/A 08/19/2016   Dr. Aviva Signs: 2 semi-pedunculated polyps in the distal sigmoid colon removed. Measure 2-5 mm. Tubular adenomas.  . COLONOSCOPY WITH PROPOFOL N/A 02/09/2020   Procedure: COLONOSCOPY WITH PROPOFOL;  Surgeon: Daneil Dolin, MD;  [facility];  Service: Endoscopy;  Laterality: N/A;  1:00pm  . ENDOSCOPIC MUCOSAL RESECTION N/A 05/02/2020   Procedure: ENDOSCOPIC MUCOSAL RESECTION;  Surgeon: Rush Landmark Telford Nab., MD;  Location: [facility];  Service: Gastroenterology;  Laterality: N/A;  . ESOPHAGOGASTRODUODENOSCOPY N/A 08/19/2016   Dr. Arnoldo Morale:  Villous appearing mucosa in the first portion of the duodenum, polyp removed with hot snare, pathology tubular adenoma. Medium sized hiatal hernia. Biopsy for CLOtest which was negative.  . ESOPHAGOGASTRODUODENOSCOPY (EGD) WITH PROPOFOL  08/20/2016   Dr. Gala Romney: assisted Dr. Arnoldo Morale emergently. Esophagus normal. Medium sized hiatal hernia. One nonbleeding cratered stellate-shaped gastric ulcer measuring 8 mm. One cratered ulcerated area with adherent clot in the second portion of the duodenum. 25 mm in diameter. Visible vessel seen. Status post bleeding control therapy. Heaped up margins consistent with incomplete removal.   . ESOPHAGOGASTRODUODENOSCOPY (EGD) WITH PROPOFOL N/A 10/27/2016   PROPOFOL;  Surgeon: Daneil Dolin, MD; Barrett's esophagus without dysplasia or malignancy.  Normal stomach with previous gastric ulcer healed.  2 duodenal polyps revealing fragments of small bowel adenoma, otherwise normal.  Recommendations to repeat endoscopy in 1 year.  . ESOPHAGOGASTRODUODENOSCOPY (EGD) WITH PROPOFOL N/A 02/09/2020   Procedure: ESOPHAGOGASTRODUODENOSCOPY (EGD) WITH PROPOFOL;  Surgeon: Daneil Dolin, MD;  [facility];  Service: Endoscopy;  Laterality: N/A;  . ESOPHAGOGASTRODUODENOSCOPY (EGD) WITH PROPOFOL N/A 05/02/2020   Procedure: ESOPHAGOGASTRODUODENOSCOPY (EGD) WITH PROPOFOL;  Surgeon: Rush Landmark Telford Nab., MD;  [facility];  Service: Gastroenterology;  Laterality: N/A;  . ESOPHAGOGASTRODUODENOSCOPY (EGD) WITH PROPOFOL N/A 12/17/2020   Procedure: ESOPHAGOGASTRODUODENOSCOPY (EGD) WITH PROPOFOL;  Surgeon: Rush Landmark Telford Nab., MD;  Location: [place];  Service: Gastroenterology;  Laterality: N/A;  . FLEXIBLE SIGMOIDOSCOPY N/A 08/20/2016   Dr. Arnoldo Morale: Hematin found in mid sigmoid colon. One hemostatic clip placed.  Marland Kitchen HEMOSTASIS CLIP PLACEMENT  05/02/2020   Procedure: HEMOSTASIS CLIP PLACEMENT;  Surgeon: Irving Copas., MD;  [facility];  Service:  Gastroenterology;;  . LEFT HEART CATH AND CORONARY ANGIOGRAPHY N/A 04/11/2020   Procedure: LEFT HEART CATH AND CORONARY ANGIOGRAPHY;  Surgeon: Sherren Mocha, MD;  Location: [facility];  Service: Cardiovascular;  Laterality: N/A;  . POLYPECTOMY  02/09/2020   Procedure: POLYPECTOMY;  Surgeon: Daneil Dolin, MD;  [facility];  Service: Endoscopy;;  . POLYPECTOMY  12/17/2020   Procedure: POLYPECTOMY;  Surgeon: Irving Copas., MD;  Location: [facility];  Service: Gastroenterology;;  . Lia Foyer LIFTING INJECTION  05/02/2020   Procedure: SUBMUCOSAL LIFTING INJECTION;  Surgeon: Irving Copas., MD;  Location: [facility];  Service: Gastroenterology;;    Current Outpatient Medications  Medication Sig Dispense Refill  . ALPRAZolam (XANAX) 1 MG tablet Take 2 mg by mouth at bedtime.     . Biotin 10000 MCG TABS Take 10,000 mcg by mouth daily.     . Calcium Carb-Cholecalciferol (CALCIUM 600/VITAMIN D3) 600-800 MG-UNIT TABS Take 1 tablet by mouth daily.    . cetirizine (ZYRTEC) 10 MG tablet Take 10 mg by mouth daily.    . fenofibrate 160 MG tablet Take 160 mg by mouth at bedtime.     Marland Kitchen HYDROcodone-acetaminophen (NORCO) 10-325 MG tablet Take 1 tablet by mouth 2 (two) times daily as needed (for pain).     Marland Kitchen icosapent Ethyl (VASCEPA) 1 g capsule Take 2 g by mouth 2 (two) times daily.    . magnesium oxide (MAG-OX) 400 MG tablet Take 400 mg by mouth daily.    Marland Kitchen  metoprolol succinate (TOPROL XL) 25 MG 24 hr tablet Take 1 tablet (25 mg total) by mouth daily. 90 tablet 3  . nitroGLYCERIN (NITROSTAT) 0.4 MG SL tablet Place 1 tablet (0.4 mg total) under the tongue every 5 (five) minutes as needed for chest pain. 25 tablet 3  . Omega-3 Fatty Acids (OMEGA-3 FISH OIL PO) Take 1 capsule by mouth 2 (two) times daily.    Marland Kitchen omeprazole (PRILOSEC) 40 MG capsule Take 1 capsule (40 mg total) by mouth 2 (two) times daily before a meal. 60 capsule 3  . vitamin B-12 (CYANOCOBALAMIN) 500 MCG  tablet Take 500 mcg by mouth daily.    . Vitamin D, Ergocalciferol, (DRISDOL) 1.25 MG (50000 UNIT) CAPS capsule Take 50,000 Units by mouth once a week.    . Zinc 50 MG TABS Take 50 mg by mouth daily.     . rosuvastatin (CRESTOR) 40 MG tablet Take 1 tablet (40 mg total) by mouth daily. 90 tablet 3   No current facility-administered medications for this visit.    Allergies as of 01/22/2021  . (No Known Allergies)    Family History  Problem Relation Age of Onset  . Cirrhosis Mother   . CAD Mother 36       Premature CAD  . Heart disease Father 61       Premature CAD  . Esophageal cancer Brother   . Unexplained death Brother 35       died in his sleep  . Colon cancer Neg Hx   . Inflammatory bowel disease Neg Hx   . Liver disease Neg Hx   . Pancreatic cancer Neg Hx   . Stomach cancer Neg Hx     Social History   Socioeconomic History  . Marital status: Single    Spouse name: Not on file  . Number of children: 2  . Years of education: Not on file  . Highest education level: Not on file  Occupational History  . Not on file  Tobacco Use  . Smoking status: Former Smoker    Packs/day: 0.25    Years: 40.00    Pack years: 10.00    Types: Cigarettes    Quit date: 09/08/2019    Years since quitting: 1.3  . Smokeless tobacco: Never Used  Vaping Use  . Vaping Use: Never used  Substance and Sexual Activity  . Alcohol use: Yes    Alcohol/week: 32.0 standard drinks    Types: 32 Cans of beer per week  . Drug use: No  . Sexual activity: Never    Birth control/protection: None  Other Topics Concern  . Not on file  Social History Narrative  . Not on file   Social Determinants of Health   Financial Resource Strain: Not on file  Food Insecurity: Not on file  Transportation Needs: Not on file  Physical Activity: Not on file  Stress: Not on file  Social Connections: Not on file    Subjective: Review of Systems  Constitutional: Negative for chills, fever, malaise/fatigue  and weight loss.  HENT: Negative for congestion and sore throat.   Respiratory: Negative for cough and shortness of breath.   Cardiovascular: Negative for chest pain and palpitations.  Gastrointestinal: Negative for abdominal pain, blood in stool, diarrhea, heartburn, melena, nausea and vomiting.  Musculoskeletal: Negative for joint pain and myalgias.  Skin: Negative for rash.  Neurological: Negative for dizziness and weakness.  Endo/Heme/Allergies: Does not bruise/bleed easily.  Psychiatric/Behavioral: Negative for depression. The patient is not nervous/anxious.  All other systems reviewed and are negative.    Objective: BP (!) 148/87   Pulse 70   Temp (!) 96.8 F (36 C) (Temporal)   Ht 6\' 3"  (1.905 m)   Wt 205 lb 9.6 oz (93.3 kg)   BMI 25.70 kg/m  Physical Exam Vitals and nursing note reviewed.  Constitutional:      General: He is not in acute distress.    Appearance: Normal appearance. He is normal weight. He is not ill-appearing, toxic-appearing or diaphoretic.  HENT:     Head: Normocephalic and atraumatic.     Nose: No congestion or rhinorrhea.  Eyes:     General: No scleral icterus. Cardiovascular:     Rate and Rhythm: Normal rate and regular rhythm.     Heart sounds: Normal heart sounds.  Pulmonary:     Effort: Pulmonary effort is normal.     Breath sounds: Normal breath sounds.  Abdominal:     General: Bowel sounds are normal. There is no distension.     Palpations: Abdomen is soft. There is no hepatomegaly, splenomegaly or mass.     Tenderness: There is no abdominal tenderness. There is no guarding or rebound.     Hernia: No hernia is present.  Musculoskeletal:     Cervical back: Neck supple.  Skin:    General: Skin is warm and dry.     Coloration: Skin is not jaundiced.     Findings: No bruising or rash.  Neurological:     General: No focal deficit present.     Mental Status: He is alert and oriented to person, place, and time. Mental status is at  baseline.  Psychiatric:        Mood and Affect: Mood normal.        Behavior: Behavior normal.        Thought Content: Thought content normal.      Assessment:  Very pleasant 55 year old male presents for follow-up on GERD and duodenal nodule.  Clinically he is doing quite well currently.  No red flag/warning signs or symptoms.  GERD and Barrett's esophagus: Currently on omeprazole 40 mg twice daily.  He is not having any breakthrough symptoms on this.  Not requiring any additional therapy.  He is taking his based on recommendations after his most recent EGD in [place].  Discussed the need for taking indefinite PPI in the setting of Barrett's esophagus.  Recommend he continue his current medications, call us for any worsening or recurrent symptoms.  Duodenal nodule: Status post EGD with piecemeal biopsy x2.  Both instances found tubular adenoma or tubulovillous adenoma.  His most recent EGD was last month.  I recommended repeat in 1 year.  He verbalized understanding and intends to follow-up as recommended.  I recommended he follow-up with [facility] based on their recommendations.  History of adenomatous colon polyp: Colonoscopy currently up-to-date, next due in 2026.  We reviewed this as well as his last colonoscopy report.  He verbalized understanding.  Confirmed he is on a recall list.   Plan: 1. Provide long-term PPI therapy education 2. Continue current medications 3. Follow-up with [facility] based on their recommendations for duodenal nodule 4. Call for any worsening or severe symptoms 5. Follow-up in 6 months    Thank you for allowing [place] to participate in the care of [place], DNP, AGNP-C Adult & Gerontological Nurse Practitioner [facility]   01/22/2021 3:44 PM   Disclaimer: This note was dictated with  voice recognition software. Similar sounding words can inadvertently be transcribed and may not be corrected upon  review.

## 2021-01-22 NOTE — Patient Instructions (Signed)
Your health issues we discussed today were:   GERD (reflux/heartburn) and Barrett's esophagus (abnormal tissue in your swallowing tube): 1. As discussed, continue taking your acid blocker (omeprazole/Prilosec 40 mg twice a day) 2. Because of Barrett's esophagus you will need to be on an acid blocker indefinitely/lifelong 3. Call us if you have any worsening or recurrent heartburn symptoms  Duodenal nodule: 1. I am glad you have done well with your procedures to address the nodule in your first part of your small intestines 2. As we discussed, follow-up with Dr. Rush Landmark in about 1 year for repeat procedure  History of colon polyps: 1. Your colonoscopy is up-to-date as we reviewed 2. You will next due in 2026 3. You are on the recall list meaning we will send you a letter when it is time to schedule your next colonoscopy  Overall I recommend:  1. Continue other current medications 2. Return for follow-up in 6 months 3. Call us for any questions or concerns   ---------------------------------------------------------------  At [facility] we value your feedback. You may receive a survey about your visit today. Please share your experience as we strive to create trusting relationships with our patients to provide genuine, compassionate, quality care.  We appreciate your understanding and patience as we review any laboratory studies, imaging, and other diagnostic tests that are ordered as we care for you. Our office policy is 5 business days for review of these results, and any emergent or urgent results are addressed in a timely manner for your best interest. If you do not hear from our office in 1 week, please contact us.   We also encourage the use of MyChart, which contains your medical information for your review as well. If you are not enrolled in this feature, an access code is on this after visit summary for your convenience. Thank you for allowing [place] to be involved  in your care.  It was great to see you today!  I hope you have a safe and warm winter!!    ---------------------------------------------------------------

## 2021-05-24 DIAGNOSIS — F5104 Psychophysiologic insomnia: Secondary | ICD-10-CM | POA: Insufficient documentation

## 2021-06-03 ENCOUNTER — Ambulatory Visit: Payer: 59 | Admitting: Cardiology

## 2021-06-12 DIAGNOSIS — E782 Mixed hyperlipidemia: Secondary | ICD-10-CM | POA: Insufficient documentation

## 2021-06-20 ENCOUNTER — Other Ambulatory Visit: Payer: Self-pay | Admitting: Cardiology

## 2021-06-26 ENCOUNTER — Encounter: Payer: Self-pay | Admitting: Cardiology

## 2021-06-26 NOTE — Progress Notes (Signed)
Cardiology Office Note  Date: 06/27/2021   ID: Pleasant, Paul Arias [birth year], MRN [id number]  PCP:  Celene Squibb, MD  Cardiologist:  Rozann Lesches, MD Electrophysiologist:  None   Chief Complaint  Patient presents with   Cardiac follow-up     History of Present Illness: Paul Arias is a 55 y.o. male last seen by Ms. Strader PA-C in November 2021.  He presents for a routine visit.  Reports no angina symptoms, generally NYHA class II dyspnea.  Still working for an [employer].  I reviewed his most recent lab work per Dr. Nevada Crane, also present medications which he is tolerating.  Lipid numbers have been improving, still not optimal as yet.  I reviewed his ECG today which is normal.  He has not used any nitroglycerin.  Past Medical History:  Diagnosis Date   Alcohol dependence in remission [facility])    Allergic rhinitis    Arthritis    Bipolar disorder ([place])    CAD (coronary artery disease)    Cardiac catheterization May 2021 - medical therapy recommended   Chronic pain    Colon polyp    Tubular adenoma   COPD (chronic obstructive pulmonary disease) (HCC)    Gastric polyp    Tubular adenoma   GERD (gastroesophageal reflux disease)    Hypertension    Mixed hyperlipidemia     Past Surgical History:  Procedure Laterality Date   BIOPSY  02/09/2020   Procedure: BIOPSY;  Surgeon: Daneil Dolin, MD;  [facility];  Service: Endoscopy;;   BIOPSY  12/17/2020   Procedure: BIOPSY;  Surgeon: Irving Copas., MD;  Location: [facility] ENDOSCOPY;  Service: Gastroenterology;;   COLONOSCOPY N/A 08/19/2016   Dr. Aviva Signs: 2 semi-pedunculated polyps in the distal sigmoid colon removed. Measure 2-5 mm. Tubular adenomas.   COLONOSCOPY WITH PROPOFOL N/A 02/09/2020   Procedure: COLONOSCOPY WITH PROPOFOL;  Surgeon: Daneil Dolin, MD;  [facility];  Service: Endoscopy;  Laterality: N/A;  1:00pm   ENDOSCOPIC MUCOSAL RESECTION N/A 05/02/2020   Procedure:  ENDOSCOPIC MUCOSAL RESECTION;  Surgeon: Rush Landmark Telford Nab., MD;  Location: [facility] ENDOSCOPY;  Service: Gastroenterology;  Laterality: N/A;   ESOPHAGOGASTRODUODENOSCOPY N/A 08/19/2016   Dr. Arnoldo Morale: Villous appearing mucosa in the first portion of the duodenum, polyp removed with hot snare, pathology tubular adenoma. Medium sized hiatal hernia. Biopsy for CLOtest which was negative.   ESOPHAGOGASTRODUODENOSCOPY (EGD) WITH PROPOFOL  08/20/2016   Dr. Gala Romney: assisted Dr. Arnoldo Morale emergently. Esophagus normal. Medium sized hiatal hernia. One nonbleeding cratered stellate-shaped gastric ulcer measuring 8 mm. One cratered ulcerated area with adherent clot in the second portion of the duodenum. 25 mm in diameter. Visible vessel seen. Status post bleeding control therapy. Heaped up margins consistent with incomplete removal.    ESOPHAGOGASTRODUODENOSCOPY (EGD) WITH PROPOFOL N/A 10/27/2016   PROPOFOL;  Surgeon: Daneil Dolin, MD; Barrett's esophagus without dysplasia or malignancy.  Normal stomach with previous gastric ulcer healed.  2 duodenal polyps revealing fragments of small bowel adenoma, otherwise normal.  Recommendations to repeat endoscopy in 1 year.   ESOPHAGOGASTRODUODENOSCOPY (EGD) WITH PROPOFOL N/A 02/09/2020   Procedure: ESOPHAGOGASTRODUODENOSCOPY (EGD) WITH PROPOFOL;  Surgeon: Daneil Dolin, MD;  [facility];  Service: Endoscopy;  Laterality: N/A;   ESOPHAGOGASTRODUODENOSCOPY (EGD) WITH PROPOFOL N/A 05/02/2020   Procedure: ESOPHAGOGASTRODUODENOSCOPY (EGD) WITH PROPOFOL;  Surgeon: Rush Landmark Telford Nab., MD;  [facility];  Service: Gastroenterology;  Laterality: N/A;   ESOPHAGOGASTRODUODENOSCOPY (EGD) WITH PROPOFOL N/A 12/17/2020  Procedure: ESOPHAGOGASTRODUODENOSCOPY (EGD) WITH PROPOFOL;  Surgeon: Rush Landmark Telford Nab., MD;  Location: [place];  Service: Gastroenterology;  Laterality: N/A;   FLEXIBLE SIGMOIDOSCOPY N/A 08/20/2016   Dr. Arnoldo Morale: Hematin found in mid  sigmoid colon. One hemostatic clip placed.   HEMOSTASIS CLIP PLACEMENT  05/02/2020   Procedure: HEMOSTASIS CLIP PLACEMENT;  Surgeon: Irving Copas., MD;  Location[facility];  Service: Gastroenterology;;   LEFT HEART CATH AND CORONARY ANGIOGRAPHY N/A 04/11/2020   Procedure: LEFT HEART CATH AND CORONARY ANGIOGRAPHY;  Surgeon: Sherren Mocha, MD;  Location: [place] CV LAB;  Service: Cardiovascular;  Laterality: N/A;   POLYPECTOMY  02/09/2020   Procedure: POLYPECTOMY;  Surgeon: Daneil Dolin, MD;  [facility];  Service: Endoscopy;;   POLYPECTOMY  12/17/2020   Procedure: POLYPECTOMY;  Surgeon: Irving Copas., MD;  Location: [place];  Service: Gastroenterology;;   SUBMUCOSAL LIFTING INJECTION  05/02/2020   Procedure: SUBMUCOSAL LIFTING INJECTION;  Surgeon: Irving Copas., MD;  [facility];  Service: Gastroenterology;;    Current Outpatient Medications  Medication Sig Dispense Refill   ALPRAZolam (XANAX) 1 MG tablet Take 2 mg by mouth at bedtime.      Calcium Carb-Cholecalciferol (CALCIUM 600/VITAMIN D3) 600-800 MG-UNIT TABS Take 1 tablet by mouth daily.     cetirizine (ZYRTEC) 10 MG tablet Take 10 mg by mouth daily.     fenofibrate 160 MG tablet Take 160 mg by mouth at bedtime.      HYDROcodone-acetaminophen (NORCO) 10-325 MG tablet Take 1 tablet by mouth 2 (two) times daily as needed (for pain).      icosapent Ethyl (VASCEPA) 1 g capsule Take 2 g by mouth 2 (two) times daily.     magnesium oxide (MAG-OX) 400 MG tablet Take 400 mg by mouth daily.     metoprolol succinate (TOPROL-XL) 25 MG 24 hr tablet TAKE 1 TABLET BY MOUTH ONCE DAILY. 60 tablet 2   nitroGLYCERIN (NITROSTAT) 0.4 MG SL tablet Place 1 tablet (0.4 mg total) under the tongue every 5 (five) minutes as needed for chest pain. 25 tablet 3   Omega-3 Fatty Acids (OMEGA-3 FISH OIL PO) Take 1 capsule by mouth 2 (two) times daily.     omeprazole (PRILOSEC) 40 MG capsule Take 1 capsule  (40 mg total) by mouth 2 (two) times daily before a meal. 60 capsule 3   rosuvastatin (CRESTOR) 40 MG tablet Take 1 tablet (40 mg total) by mouth daily. 90 tablet 3   vitamin B-12 (CYANOCOBALAMIN) 500 MCG tablet Take 500 mcg by mouth daily.     Vitamin D, Ergocalciferol, (DRISDOL) 1.25 MG (50000 UNIT) CAPS capsule Take 50,000 Units by mouth once a week.     Zinc 50 MG TABS Take 50 mg by mouth daily.      Biotin 10000 MCG TABS Take 10,000 mcg by mouth daily.  (Patient not taking: Reported on 06/27/2021)     No current facility-administered medications for this visit.   Allergies:  Patient has no known allergies.   ROS: No palpitations or syncope.  Physical Exam: VS:  BP 110/70   Pulse 63   Ht 6\' 3"  (1.905 m)   Wt 198 lb (89.8 kg)   SpO2 97%   BMI 24.75 kg/m , BMI Body mass index is 24.75 kg/m.  Wt Readings from Last 3 Encounters:  06/27/21 198 lb (89.8 kg)  01/22/21 205 lb 9.6 oz (93.3 kg)  12/17/20 202 lb (91.6 kg)    General: Patient appears comfortable at  rest. HEENT: Conjunctiva and lids normal, wearing a mask. Neck: Supple, no elevated JVP or carotid bruits, no thyromegaly. Lungs: Clear to auscultation, nonlabored breathing at rest. Cardiac: Regular rate and rhythm, no S3 or significant systolic murmur, no pericardial rub. Extremities: No pitting edema.  ECG:  An ECG dated 04/11/2020 was personally reviewed today and demonstrated:  Sinus arrhythmia and bradycardia.  Recent Labwork:    Component Value Date/Time   CHOL 183 08/27/2020 1049   TRIG 102 08/27/2020 1049   HDL 35 (L) 08/27/2020 1049   CHOLHDL 5.2 08/27/2020 1049   VLDL 20 08/27/2020 1049   LDLCALC 128 (H) 08/27/2020 1049  July 2022: Cholesterol 185, triglycerides 249, HDL 42, LDL 101, BUN 11, creatinine 1.03, potassium 4.3, AST 41, ALT 33, hemoglobin 13.5, platelets 264  Other Studies Reviewed Today:  Cardiac catheterization 04/11/2020: 1. Subtotal chronic occlusion of the RCA with left-to-right collaterals  and delayed antegrade filling of the vessel 2. Moderate calcific stenosis of the proximal/mid-LAD 3. Widely patent left circumflex/OM 4. Normal LV function  Assessment and Plan:  1.  Medically managed CAD with subtotally occluded RCA associated with left-to-right collaterals and moderate proximal to mid LAD disease by cardiac catheterization in May of last year.  He does not describe any worsening angina or nitroglycerin use.  Also not on standing aspirin given prior history of GI bleeding.  Continue observation on Toprol-XL and Crestor.  2.  Mixed hyperlipidemia, currently on Crestor and omega-3 supplements.  Medication Adjustments/Labs and Tests Ordered: Current medicines are reviewed at length with the patient today.  Concerns regarding medicines are outlined above.   Tests Ordered: No orders of the defined types were placed in this encounter.   Medication Changes: No orders of the defined types were placed in this encounter.   Disposition:  Follow up  6 months.  Signed, Satira Sark, MD, [facility] 06/27/2021 11:17 AM    [facility] at [facility] [address] [place], [place] [address] Phone: [phone]; Fax: [phone]

## 2021-06-27 ENCOUNTER — Encounter: Payer: Self-pay | Admitting: Cardiology

## 2021-06-27 ENCOUNTER — Ambulatory Visit (INDEPENDENT_AMBULATORY_CARE_PROVIDER_SITE_OTHER): Payer: 59 | Admitting: Cardiology

## 2021-06-27 ENCOUNTER — Other Ambulatory Visit: Payer: Self-pay

## 2021-06-27 VITALS — BP 110/70 | HR 63 | Ht 75.0 in | Wt 198.0 lb

## 2021-06-27 DIAGNOSIS — I25119 Atherosclerotic heart disease of native coronary artery with unspecified angina pectoris: Secondary | ICD-10-CM | POA: Diagnosis not present

## 2021-06-27 DIAGNOSIS — E782 Mixed hyperlipidemia: Secondary | ICD-10-CM | POA: Diagnosis not present

## 2021-06-27 NOTE — Patient Instructions (Signed)

## 2021-06-27 NOTE — Addendum Note (Signed)
Addended by: Barbarann Ehlers A on: 06/27/2021 03:32 PM   Modules accepted: Orders

## 2021-07-16 ENCOUNTER — Other Ambulatory Visit: Payer: Self-pay | Admitting: Internal Medicine

## 2021-07-16 ENCOUNTER — Other Ambulatory Visit: Payer: Self-pay | Admitting: Gastroenterology

## 2021-07-22 DIAGNOSIS — F411 Generalized anxiety disorder: Secondary | ICD-10-CM | POA: Insufficient documentation

## 2021-07-22 DIAGNOSIS — G894 Chronic pain syndrome: Secondary | ICD-10-CM | POA: Insufficient documentation

## 2021-07-23 ENCOUNTER — Ambulatory Visit: Payer: 59 | Admitting: Gastroenterology

## 2021-07-23 ENCOUNTER — Encounter: Payer: Self-pay | Admitting: Internal Medicine

## 2021-07-23 ENCOUNTER — Ambulatory Visit: Payer: 59 | Admitting: Nurse Practitioner

## 2021-08-20 ENCOUNTER — Other Ambulatory Visit: Payer: Self-pay | Admitting: Cardiology

## 2021-10-02 ENCOUNTER — Ambulatory Visit (INDEPENDENT_AMBULATORY_CARE_PROVIDER_SITE_OTHER): Payer: 59 | Admitting: Gastroenterology

## 2021-10-02 ENCOUNTER — Other Ambulatory Visit: Payer: Self-pay

## 2021-10-02 ENCOUNTER — Encounter: Payer: Self-pay | Admitting: Gastroenterology

## 2021-10-02 VITALS — BP 134/78 | HR 64 | Temp 97.1°F | Ht 75.0 in | Wt 193.4 lb

## 2021-10-02 DIAGNOSIS — K2270 Barrett's esophagus without dysplasia: Secondary | ICD-10-CM | POA: Diagnosis not present

## 2021-10-02 DIAGNOSIS — D132 Benign neoplasm of duodenum: Secondary | ICD-10-CM

## 2021-10-02 NOTE — Progress Notes (Signed)
Referring Provider: Celene Squibb, MD Primary Care Physician:  Celene Squibb, MD Primary GI: Dr. Gala Romney  Chief Complaint  Patient presents with   Gastroesophageal Reflux    Doing ok on med    HPI:   Paul Arias is a 55 y.o. male presenting today with a history of Barrett's esophagus, duodenal adenomas first appreciated in 2017 s/p resection by Dr. Arnoldo Morale, with surveillance EGDs in interim and undergoing EMR by Dr. Rush Landmark in May 2021. Surveillance EGD Jan 2022 with duodenal scar from prior EMR, small area of possible recurrence s/p removal. One fragment of duodenal adenoma. Needs surveillance EGD Jan 2023. Colonoscopy on file from 2021 with surveillance due in 2026 due to history of colonic adenomas.  Chronic GERD controlled on omeprazole BID. He is inquiring about reducing to once per day. No abdominal pain. No overt GI bleeding. No changes in bowel habits. No N/V. Doing well today overall. Would like to keep surveillance EGD here locally.     Past Medical History:  Diagnosis Date   Alcohol dependence in remission [facility])    Allergic rhinitis    Arthritis    Bipolar disorder ([place])    CAD (coronary artery disease)    Cardiac catheterization May 2021 - medical therapy recommended   Chronic pain    Colon polyp    Tubular adenoma   COPD (chronic obstructive pulmonary disease) (HCC)    Gastric polyp    Tubular adenoma   GERD (gastroesophageal reflux disease)    Hypertension    Mixed hyperlipidemia     Past Surgical History:  Procedure Laterality Date   BIOPSY  02/09/2020   Procedure: BIOPSY;  Surgeon: Daneil Dolin, MD;  [facility];  Service: Endoscopy;;   BIOPSY  12/17/2020   Procedure: BIOPSY;  Surgeon: Irving Copas., MD;  Location: [facility] ENDOSCOPY;  Service: Gastroenterology;;   COLONOSCOPY N/A 08/19/2016   Dr. Aviva Signs: 2 semi-pedunculated polyps in the distal sigmoid colon removed. Measure 2-5 mm. Tubular adenomas.   COLONOSCOPY  WITH PROPOFOL N/A 02/09/2020   7 polyps (tubular adenomas and hyperplastic)with recommended 5-year repeat (2026).   ENDOSCOPIC MUCOSAL RESECTION N/A 05/02/2020   Procedure: ENDOSCOPIC MUCOSAL RESECTION;  Surgeon: Rush Landmark Telford Nab., MD;  Location: [facility];  Service: Gastroenterology;  Laterality: N/A;   ESOPHAGOGASTRODUODENOSCOPY N/A 08/19/2016   Dr. Arnoldo Morale: Villous appearing mucosa in the first portion of the duodenum, polyp removed with hot snare, pathology tubular adenoma. Medium sized hiatal hernia. Biopsy for CLOtest which was negative.   ESOPHAGOGASTRODUODENOSCOPY (EGD) WITH PROPOFOL  08/20/2016   Dr. Gala Romney: assisted Dr. Arnoldo Morale emergently. Esophagus normal. Medium sized hiatal hernia. One nonbleeding cratered stellate-shaped gastric ulcer measuring 8 mm. One cratered ulcerated area with adherent clot in the second portion of the duodenum. 25 mm in diameter. Visible vessel seen. Status post bleeding control therapy. Heaped up margins consistent with incomplete removal.    ESOPHAGOGASTRODUODENOSCOPY (EGD) WITH PROPOFOL N/A 10/27/2016   PROPOFOL;  Surgeon: Daneil Dolin, MD; Barrett's esophagus without dysplasia or malignancy.  Normal stomach with previous gastric ulcer healed.  2 duodenal polyps revealing fragments of small bowel adenoma, otherwise normal.  Recommendations to repeat endoscopy in 1 year.   ESOPHAGOGASTRODUODENOSCOPY (EGD) WITH PROPOFOL N/A 02/09/2020   probable short segment Barrett's s/p biopsy, multiple adenomatous appearing duodenal lesions s/p biopsy.   ESOPHAGOGASTRODUODENOSCOPY (EGD) WITH PROPOFOL N/A 05/02/2020   small hiatal hernia, mucosal nodule in duodenum with complete removal s/p piecemeal mucosal resection and  avulsion s/p clips.   ESOPHAGOGASTRODUODENOSCOPY (EGD) WITH PROPOFOL N/A 12/17/2020   duodenal scar from prior EMR, small are of possible recurrence s/p removal. One fragment of duodenal adenoma. Needs surveillance EGD Jan 2023.   FLEXIBLE  SIGMOIDOSCOPY N/A 08/20/2016   Dr. Arnoldo Morale: Hematin found in mid sigmoid colon. One hemostatic clip placed.   HEMOSTASIS CLIP PLACEMENT  05/02/2020   Procedure: HEMOSTASIS CLIP PLACEMENT;  Surgeon: Irving Copas., MD;  Location: [facility];  Service: Gastroenterology;;   LEFT HEART CATH AND CORONARY ANGIOGRAPHY N/A 04/11/2020   Procedure: LEFT HEART CATH AND CORONARY ANGIOGRAPHY;  Surgeon: Sherren Mocha, MD;  Location: [place] CV LAB;  Service: Cardiovascular;  Laterality: N/A;   POLYPECTOMY  02/09/2020   Procedure: POLYPECTOMY;  Surgeon: Daneil Dolin, MD;  [facility];  Service: Endoscopy;;   POLYPECTOMY  12/17/2020   Procedure: POLYPECTOMY;  Surgeon: Irving Copas., MD;  Location: [place];  Service: Gastroenterology;;   SUBMUCOSAL LIFTING INJECTION  05/02/2020   Procedure: SUBMUCOSAL LIFTING INJECTION;  Surgeon: Irving Copas., MD;  [facility]OS[facility];  Service: Gastroenterology;;    Current Outpatient Medications  Medication Sig Dispense Refill   ALPRAZolam (XANAX) 1 MG tablet Take 2 mg by mouth at bedtime.      Calcium Carb-Cholecalciferol (CALCIUM 600/VITAMIN D3) 600-800 MG-UNIT TABS Take 1 tablet by mouth daily.     cetirizine (ZYRTEC) 10 MG tablet Take 10 mg by mouth daily.     fenofibrate 160 MG tablet Take 160 mg by mouth at bedtime.      HYDROcodone-acetaminophen (NORCO) 10-325 MG tablet Take 1 tablet by mouth 2 (two) times daily as needed (for pain).      icosapent Ethyl (VASCEPA) 1 g capsule Take 2 g by mouth 2 (two) times daily.     magnesium oxide (MAG-OX) 400 MG tablet Take 400 mg by mouth daily.     metoprolol succinate (TOPROL-XL) 25 MG 24 hr tablet TAKE 1 TABLET BY MOUTH ONCE DAILY. 30 tablet 6   nitroGLYCERIN (NITROSTAT) 0.4 MG SL tablet Place 1 tablet (0.4 mg total) under the tongue every 5 (five) minutes as needed for chest pain. 25 tablet 3   Omega-3 Fatty Acids (OMEGA-3 FISH OIL PO) Take 1 capsule by mouth 2  (two) times daily.     omeprazole (PRILOSEC) 40 MG capsule TAKE (1) CAPSULE BY MOUTH TWICE DAILY BEFORE A MEAL. 60 capsule 5   rosuvastatin (CRESTOR) 40 MG tablet TAKE (1) TABLET BY MOUTH AT BEDTIME FOR HIGH CHOLESTEROL. 30 tablet 6   vitamin B-12 (CYANOCOBALAMIN) 500 MCG tablet Take 500 mcg by mouth daily.     Zinc 50 MG TABS Take 50 mg by mouth daily.      No current facility-administered medications for this visit.    Allergies as of 10/02/2021   (No Known Allergies)    Family History  Problem Relation Age of Onset   Cirrhosis Mother    CAD Mother 88       Premature CAD   Heart disease Father 2       Premature CAD   Esophageal cancer Brother    Unexplained death Brother 19       died in his sleep   Colon cancer Neg Hx    Inflammatory bowel disease Neg Hx    Liver disease Neg Hx    Pancreatic cancer Neg Hx    Stomach cancer Neg Hx     Social History   Socioeconomic History   Marital status:  Single    Spouse name: Not on file   Number of children: 2   Years of education: Not on file   Highest education level: Not on file  Occupational History   Not on file  Tobacco Use   Smoking status: Former    Packs/day: 0.25    Years: 40.00    Pack years: 10.00    Types: Cigarettes    Quit date: 09/08/2019    Years since quitting: 2.0   Smokeless tobacco: Never  Vaping Use   Vaping Use: Never used  Substance and Sexual Activity   Alcohol use: Yes    Alcohol/week: 32.0 standard drinks    Types: 32 Cans of beer per week    Comment: daily   Drug use: No   Sexual activity: Never    Birth control/protection: None  Other Topics Concern   Not on file  Social History Narrative   Not on file   Social Determinants of Health   Financial Resource Strain: Not on file  Food Insecurity: Not on file  Transportation Needs: Not on file  Physical Activity: Not on file  Stress: Not on file  Social Connections: Not on file    Review of Systems: Gen: Denies fever, chills,  anorexia. Denies fatigue, weakness, weight loss.  CV: Denies chest pain, palpitations, syncope, peripheral edema, and claudication. Resp: Denies dyspnea at rest, cough, wheezing, coughing up blood, and pleurisy. GI: see HPI Derm: Denies rash, itching, dry skin Psych: Denies depression, anxiety, memory loss, confusion. No homicidal or suicidal ideation.  Heme: Denies bruising, bleeding, and enlarged lymph nodes.  Physical Exam: BP 134/78   Pulse 64   Temp (!) 97.1 F (36.2 C) (Temporal)   Ht 6\' 3"  (1.905 m)   Wt 193 lb 6.4 oz (87.7 kg)   BMI 24.17 kg/m  General:   Alert and oriented. No distress noted. Pleasant and cooperative.  Head:  Normocephalic and atraumatic. Eyes:  Conjuctiva clear without scleral icterus. Mouth:  mask in place Abdomen:  +BS, soft, non-tender and non-distended. No rebound or guarding. No HSM or masses noted. Msk:  Symmetrical without gross deformities. Normal posture. Extremities:  Without edema. Neurologic:  Alert and  oriented x4 Psych:  Alert and cooperative. Normal mood and affect.  ASSESSMENT: LITTLE WINTON is a 55 y.o. male presenting today with a history of Barrett's esophagus, duodenal adenomas first appreciated in 2017 s/p resection by Dr. Arnoldo Morale, with surveillance EGDs in interim and undergoing EMR by Dr. Rush Landmark in May 2021. Surveillance EGD Jan 2022 with duodenal scar from prior EMR, small area of possible recurrence s/p removal. One fragment of duodenal adenoma. Needs surveillance EGD Jan 2023. Colonoscopy on file from 2021 with surveillance due in 2026 due to history of colonic adenomas.   He is doing well on omeprazole BID. He would like to decrease to once daily, which is appropriate at this time.  Due for surveillance EGD in Jan 2023. He would like to keep care here locally and requesting that I review with Dr. Gala Romney. Will review and get back with patient with further recommendations in near future.   PLAN:  EGD in Jan 2023,  location to be determined  Decrease omeprazole to once daily  Further recommendations to follow   Annitta Needs, PhD, ANP-BC [facility] Gastroenterology

## 2021-10-02 NOTE — Patient Instructions (Signed)
Let's decrease omeprazole to once daily.   I will speak with Dr. Gala Romney regarding the endoscopy!  Further recommendations to follow!  It was a pleasure to see you today. I want to create trusting relationships with patients to provide genuine, compassionate, and quality care. I value your feedback. If you receive a survey regarding your visit,  I greatly appreciate you taking time to fill this out.   Annitta Needs, PhD, ANP-BC [facility] Gastroenterology

## 2021-10-07 ENCOUNTER — Telehealth: Payer: Self-pay | Admitting: Gastroenterology

## 2021-10-07 NOTE — Telephone Encounter (Signed)
[facility] clinical pool:  I have spoken with Dr. Gala Romney at patient's request. We can go ahead and pursue EGD here for surveillance of duodenal adenoma.   Please arrange EGD with Propofol, ASA 3, for history of duodenal adenoma. Thanks!

## 2021-10-07 NOTE — Telephone Encounter (Signed)
Will schedule procedure when Dr. Rourk's next schedule is available. 

## 2021-10-10 ENCOUNTER — Telehealth: Payer: Self-pay | Admitting: Internal Medicine

## 2021-10-10 NOTE — Telephone Encounter (Signed)
Pt saw Roseanne Kaufman, NP on 10/26 and called today to let us know he thinks he needs to take his indigestion medicine twice a day instead of once a day. He does not know the name of medication. [phone]

## 2021-10-10 NOTE — Telephone Encounter (Signed)
Pt wants to switch back to taking his Omeprazole x 2 a day. Pt had an office visit with you 10/26, but the Rx was written by Aliene Altes.

## 2021-10-11 NOTE — Telephone Encounter (Signed)
Called pt to schedule. He states he was told if Dr. Gala Romney did the EGD and found something he would still have to go back and see Dr. Orson Eva in [place]. So he wants to know if he should wait until [place] when he see [facility] to have repeat procedure done with them? Thanks

## 2021-10-14 MED ORDER — OMEPRAZOLE 40 MG PO CPDR: DELAYED_RELEASE_CAPSULE | ORAL | 3 refills | Status: DC

## 2021-10-14 NOTE — Telephone Encounter (Signed)
Tried to call pt, no answer, no voicemail set-up.

## 2021-10-14 NOTE — Telephone Encounter (Signed)
Spoke to pt, informed him of Anna's recommendation. He said he will just go back to Dr. Rush Landmark.

## 2021-10-14 NOTE — Telephone Encounter (Signed)
Pt returned call and was advised of his Rx being sent in

## 2021-10-14 NOTE — Telephone Encounter (Signed)
Sent in prescription 

## 2021-10-14 NOTE — Addendum Note (Signed)
Addended by: Annitta Needs on: 10/14/2021 09:47 AM   Modules accepted: Orders

## 2021-10-14 NOTE — Telephone Encounter (Signed)
That is completely up to him. I spoke with Dr. Gala Romney. He could pursue here, but if a lesion is found that is not able to be removed, he would need to be referred to Dr. Rush Landmark. So, if patient would like to just wait until he goes in January to see [facility], that is fine as well.

## 2021-10-14 NOTE — Telephone Encounter (Signed)
Phoned the pt and his vm has not been set up

## 2021-10-24 ENCOUNTER — Telehealth: Payer: Self-pay | Admitting: Cardiology

## 2021-10-24 NOTE — Telephone Encounter (Signed)
   Patient Name: BENUEL LY  DOB: 10-12-1966 MRN: [id number]  Primary Cardiologist: Rozann Lesches, MD  Chart reviewed as part of pre-operative protocol coverage. Cataract extractions are recognized in guidelines as low risk surgeries that do not typically require specific preoperative testing or holding of blood thinner therapy. Therefore, given past medical history and time since last visit, based on ACC/AHA guidelines, DAMIONE ROBIDEAU would be at acceptable risk for the planned procedure without further cardiovascular testing.   I will route this recommendation to the requesting party via Epic fax function and remove from pre-op pool.  Please call with questions.  Deberah Pelton, NP 10/24/2021, 1:56 PM

## 2021-10-24 NOTE — Telephone Encounter (Signed)
   [facility] Pre-operative Risk Assessment    Patient Name: Paul Arias  DOB: August 30, 1966 MRN: [id number]  HEARTCARE STAFF:  - IMPORTANT!!!!!! Under Visit Info/Reason for Call, type in Other and utilize the format Clearance MM/DD/YY or Clearance TBD. Do not use dashes or single digits. - Please review there is not already an duplicate clearance open for this procedure. - If request is for dental extraction, please clarify the # of teeth to be extracted. - If the patient is currently at the dentist's office, call Pre-Op Callback Staff (MA/nurse) to input urgent request.  - If the patient is not currently in the dentist office, please route to the Pre-Op pool.  Request for surgical clearance:  What type of surgery is being performed? Cataract Extraction by PE, IOL - left, right   When is this surgery scheduled? 11/15/2021  What type of clearance is required (medical clearance vs. Pharmacy clearance to hold med vs. Both)? Dr. Manuella Ghazi wants to know the cardiac risk with having this procedure done.   Are there any medications that need to be held prior to surgery and how long? Doesn't indicate  Practice name and name of physician performing surgery? [facility]/ Dr, Harrell Gave T. Manuella Ghazi  What is the office phone number? [phone] ext [phone]   7.   What is the office fax number? [phone]  8.   Anesthesia type (None, local, MAC, general) ? Iv sedation   Tildon Husky 10/24/2021, 1:23 PM  _________________________________________________________________   (provider comments below)

## 2021-12-20 DIAGNOSIS — I2510 Atherosclerotic heart disease of native coronary artery without angina pectoris: Secondary | ICD-10-CM | POA: Insufficient documentation

## 2021-12-20 DIAGNOSIS — H269 Unspecified cataract: Secondary | ICD-10-CM | POA: Insufficient documentation

## 2022-01-13 ENCOUNTER — Ambulatory Visit: Payer: 59 | Admitting: Cardiology

## 2022-01-13 ENCOUNTER — Encounter: Payer: Self-pay | Admitting: Cardiology

## 2022-01-13 ENCOUNTER — Other Ambulatory Visit: Payer: Self-pay

## 2022-01-13 VITALS — BP 140/80 | HR 67 | Ht 74.0 in | Wt 193.5 lb

## 2022-01-13 DIAGNOSIS — I25119 Atherosclerotic heart disease of native coronary artery with unspecified angina pectoris: Secondary | ICD-10-CM

## 2022-01-13 DIAGNOSIS — E785 Hyperlipidemia, unspecified: Secondary | ICD-10-CM

## 2022-01-13 NOTE — Progress Notes (Signed)
Cardiology Office Note  Date: 01/13/2022   ID: Whitaker, Holderman 09-Apr-1966, MRN [id number]  PCP:  Celene Squibb, MD  Cardiologist:  Rozann Lesches, MD Electrophysiologist:  None   Chief Complaint  Patient presents with   Cardiac follow-up    History of Present Illness: Paul Arias is a 56 y.o. male last seen in July 2022.  He is here for a routine visit.  Reports somewhat more dyspnea on exertion over the last 3 to 4 months.  He continues to work for an [employer].  No definite angina or nitroglycerin use.  I reviewed his medications which are noted below.  He reports compliance with therapy.  Despite high-dose Crestor his recent follow-up LDL was 128.  He is on fenofibrate and omega-3 supplements, triglycerides were 192.  I reviewed the remainder of his medications which are noted below.  Cardiac catheterization in May 2021 showed chronic occlusion of the RCA with left-to-right collaterals and also moderate calcific stenosis of the proximal to mid LAD that was felt to be best managed medically at that time.  Past Medical History:  Diagnosis Date   Alcohol dependence in remission [facility])    Allergic rhinitis    Arthritis    Bipolar disorder ([place])    CAD (coronary artery disease)    Cardiac catheterization May 2021 - medical therapy recommended   Chronic pain    Colon polyp    Tubular adenoma   COPD (chronic obstructive pulmonary disease) (HCC)    Gastric polyp    Tubular adenoma   GERD (gastroesophageal reflux disease)    Hypertension    Mixed hyperlipidemia     Past Surgical History:  Procedure Laterality Date   BIOPSY  02/09/2020   Procedure: BIOPSY;  Surgeon: Daneil Dolin, MD;  [facility];  Service: Endoscopy;;   BIOPSY  12/17/2020   Procedure: BIOPSY;  Surgeon: Irving Copas., MD;  Location: [facility] ENDOSCOPY;  Service: Gastroenterology;;   COLONOSCOPY N/A 08/19/2016   Dr. Aviva Signs: 2 semi-pedunculated polyps in the distal  sigmoid colon removed. Measure 2-5 mm. Tubular adenomas.   COLONOSCOPY WITH PROPOFOL N/A 02/09/2020   7 polyps (tubular adenomas and hyperplastic)with recommended 5-year repeat (2026).   ENDOSCOPIC MUCOSAL RESECTION N/A 05/02/2020   Procedure: ENDOSCOPIC MUCOSAL RESECTION;  Surgeon: Rush Landmark Telford Nab., MD;  Location: [facility];  Service: Gastroenterology;  Laterality: N/A;   ESOPHAGOGASTRODUODENOSCOPY N/A 08/19/2016   Dr. Arnoldo Morale: Villous appearing mucosa in the first portion of the duodenum, polyp removed with hot snare, pathology tubular adenoma. Medium sized hiatal hernia. Biopsy for CLOtest which was negative.   ESOPHAGOGASTRODUODENOSCOPY (EGD) WITH PROPOFOL  08/20/2016   Dr. Gala Romney: assisted Dr. Arnoldo Morale emergently. Esophagus normal. Medium sized hiatal hernia. One nonbleeding cratered stellate-shaped gastric ulcer measuring 8 mm. One cratered ulcerated area with adherent clot in the second portion of the duodenum. 25 mm in diameter. Visible vessel seen. Status post bleeding control therapy. Heaped up margins consistent with incomplete removal.    ESOPHAGOGASTRODUODENOSCOPY (EGD) WITH PROPOFOL N/A 10/27/2016   PROPOFOL;  Surgeon: Daneil Dolin, MD; Barrett's esophagus without dysplasia or malignancy.  Normal stomach with previous gastric ulcer healed.  2 duodenal polyps revealing fragments of small bowel adenoma, otherwise normal.  Recommendations to repeat endoscopy in 1 year.   ESOPHAGOGASTRODUODENOSCOPY (EGD) WITH PROPOFOL N/A 02/09/2020   probable short segment Barrett's s/p biopsy, multiple adenomatous appearing duodenal lesions s/p biopsy.   ESOPHAGOGASTRODUODENOSCOPY (EGD) WITH PROPOFOL N/A 05/02/2020   small hiatal  hernia, mucosal nodule in duodenum with complete removal s/p piecemeal mucosal resection and avulsion s/p clips.   ESOPHAGOGASTRODUODENOSCOPY (EGD) WITH PROPOFOL N/A 12/17/2020   duodenal scar from prior EMR, small are of possible recurrence s/p removal. One fragment of  duodenal adenoma. Needs surveillance EGD Jan 2023.   FLEXIBLE SIGMOIDOSCOPY N/A 08/20/2016   Dr. Arnoldo Morale: Hematin found in mid sigmoid colon. One hemostatic clip placed.   HEMOSTASIS CLIP PLACEMENT  05/02/2020   Procedure: HEMOSTASIS CLIP PLACEMENT;  Surgeon: Irving Copas., MD;  Location: [facility];  Service: Gastroenterology;;   LEFT HEART CATH AND CORONARY ANGIOGRAPHY N/A 04/11/2020   Procedure: LEFT HEART CATH AND CORONARY ANGIOGRAPHY;  Surgeon: Sherren Mocha, MD;  Location: [facility] CV LAB;  Service: Cardiovascular;  Laterality: N/A;   POLYPECTOMY  02/09/2020   Procedure: POLYPECTOMY;  Surgeon: Daneil Dolin, MD;  [facility];  Service: Endoscopy;;   POLYPECTOMY  12/17/2020   Procedure: POLYPECTOMY;  Surgeon: Irving Copas., MD;  Location: [place];  Service: Gastroenterology;;   SUBMUCOSAL LIFTING INJECTION  05/02/2020   Procedure: SUBMUCOSAL LIFTING INJECTION;  Surgeon: Irving Copas., MD;  Location: [facility] ENDOSCOPY;  Service: Gastroenterology;;    Current Outpatient Medications  Medication Sig Dispense Refill   ALPRAZolam (XANAX) 1 MG tablet Take 2 mg by mouth at bedtime.      Calcium Carb-Cholecalciferol (CALCIUM 600/VITAMIN D3) 600-800 MG-UNIT TABS Take 1 tablet by mouth daily.     cetirizine (ZYRTEC) 10 MG tablet Take 10 mg by mouth daily.     fenofibrate 160 MG tablet Take 160 mg by mouth at bedtime.      HYDROcodone-acetaminophen (NORCO) 10-325 MG tablet Take 1 tablet by mouth 2 (two) times daily as needed (for pain).      magnesium oxide (MAG-OX) 400 MG tablet Take 400 mg by mouth daily.     metoprolol succinate (TOPROL-XL) 25 MG 24 hr tablet TAKE 1 TABLET BY MOUTH ONCE DAILY. 30 tablet 6   nitroGLYCERIN (NITROSTAT) 0.4 MG SL tablet Place 1 tablet (0.4 mg total) under the tongue every 5 (five) minutes as needed for chest pain. 25 tablet 3   Omega-3 Fatty Acids (OMEGA-3 FISH OIL PO) Take 1 capsule by mouth 2 (two) times daily.      omeprazole (PRILOSEC) 40 MG capsule TAKE (1) CAPSULE BY MOUTH TWICE DAILY BEFORE A MEAL. 180 capsule 3   rosuvastatin (CRESTOR) 40 MG tablet TAKE (1) TABLET BY MOUTH AT BEDTIME FOR HIGH CHOLESTEROL. 30 tablet 6   vitamin B-12 (CYANOCOBALAMIN) 500 MCG tablet Take 500 mcg by mouth daily.     Zinc 50 MG TABS Take 50 mg by mouth daily.      No current facility-administered medications for this visit.   Allergies:  Patient has no known allergies.   ROS: No palpitations or syncope.  Physical Exam: VS:  BP 140/80    Pulse 67    Ht 6\' 2"  (1.88 m)    Wt 193 lb 8 oz (87.8 kg)    SpO2 98%    BMI 24.84 kg/m , BMI Body mass index is 24.84 kg/m.  Wt Readings from Last 3 Encounters:  01/13/22 193 lb 8 oz (87.8 kg)  10/02/21 193 lb 6.4 oz (87.7 kg)  06/27/21 198 lb (89.8 kg)    General: Patient appears comfortable at rest. HEENT: Conjunctiva and lids normal, wearing a mask. Neck: Supple, no elevated JVP or carotid bruits, no thyromegaly. Lungs: Clear to auscultation, nonlabored breathing at rest. Cardiac: Regular rate  and rhythm, no S3 or significant systolic murmur, no pericardial rub. Extremities: No pitting edema.  ECG:  An ECG dated 06/27/2021 was personally reviewed today and demonstrated:  Sinus rhythm.  Recent Labwork:    Component Value Date/Time   CHOL 183 08/27/2020 1049   TRIG 102 08/27/2020 1049   HDL 35 (L) 08/27/2020 1049   CHOLHDL 5.2 08/27/2020 1049   VLDL 20 08/27/2020 1049   LDLCALC 128 (H) 08/27/2020 1049  January 2023: Cholesterol 212, triglycerides 192, HDL 55, LDL 123, BUN 16, creatinine 1.08, potassium 4.9, AST 29, ALT 33, hemoglobin 14.4, platelets 286  Other Studies Reviewed Today:  Cardiac catheterization 04/11/2020: 1. 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 Moderate calcific stenosis of the proximal/mid-LAD 3. Widely patent left circumflex/OM 4. Normal LV function  Assessment and  Plan:  1.  CAD including chronically occluded RCA with left-to-right collaterals and moderate calcific proximal to mid LAD stenosis.  Continue Toprol-XL and Crestor.  He has not been on aspirin with prior history of GI bleed.  Plan to obtain a Lexiscan Myoview to follow-up on ischemic burden.  2.  Mixed hyperlipidemia, currently on Crestor 40 mg daily, fenofibrate, and omega-3 supplements.  Recent triglycerides 192 and LDL remains 123 well above goal.  We will request consultation with the lipid clinic for adjunctive therapy versus switch to SGLT2 inhibitor.  Medication Adjustments/Labs and Tests Ordered: Current medicines are reviewed at length with the patient today.  Concerns regarding medicines are outlined above.   Tests Ordered: Orders Placed This Encounter  Procedures   AMB Referral to [facility]    Medication Changes: No orders of the defined types were placed in this encounter.   Disposition:  Follow up  6 months.  Signed, Satira Sark, MD, [facility] 01/13/2022 11:47 AM    [facility] [place]. [address] [place], [place] [address] Phone: [phone]; Fax: [phone]

## 2022-01-13 NOTE — Patient Instructions (Addendum)
Medication Instructions:  Your physician recommends that you continue on your current medications as directed. Please refer to the Current Medication list given to you today.   Labwork: None today  Testing/Procedures: Your physician has requested that you have a lexiscan myoview. For further information please visit [email or web address]. Please follow instruction sheet, as given.   Follow-Up: 6 months  Any Other Special Instructions Will Be Listed Below (If Applicable).    You have been referred to [facility] in [place]. They wil call you to schedula an appointment. Dr.McDowell states you my do a telephone consultation with them.   If you need a refill on your cardiac medications before your next appointment, please call your pharmacy.

## 2022-01-16 ENCOUNTER — Other Ambulatory Visit: Payer: Self-pay

## 2022-01-16 ENCOUNTER — Ambulatory Visit (HOSPITAL_COMMUNITY)
Admission: RE | Admit: 2022-01-16 | Discharge: 2022-01-16 | Disposition: A | Discharge status: Home / Self Care | Payer: 59 | Source: Ambulatory Visit | Attending: Cardiology | Admitting: Cardiology

## 2022-01-16 ENCOUNTER — Encounter (HOSPITAL_COMMUNITY)
Admission: RE | Admit: 2022-01-16 | Discharge: 2022-01-16 | Disposition: A | Discharge status: Home / Self Care | Payer: 59 | Source: Ambulatory Visit | Attending: Cardiology | Admitting: Cardiology

## 2022-01-16 DIAGNOSIS — I25119 Atherosclerotic heart disease of native coronary artery with unspecified angina pectoris: Secondary | ICD-10-CM | POA: Insufficient documentation

## 2022-01-16 LAB — NM MYOCAR MULTI W/SPECT W/WALL MOTION / EF
Base ST Depression (mm): 0 mm
LV dias vol: 119 mL (ref 62–150)
LV sys vol: 49 mL
Nuc Stress EF: 59 %
Peak HR: 95 {beats}/min
RATE: 0.3
Rest HR: 54 {beats}/min
Rest Nuclear Isotope Dose: 10 mCi
SDS: 3
SRS: 1
SSS: 4
ST Depression (mm): 0 mm
Stress Nuclear Isotope Dose: 32 mCi
TID: 1.08

## 2022-01-16 MED ORDER — TECHNETIUM TC 99M TETROFOSMIN IV KIT
10.0000 | PACK | Freq: Once | INTRAVENOUS | Status: AC | PRN
  Administered 2022-01-16: 10 via INTRAVENOUS

## 2022-01-16 MED ORDER — TECHNETIUM TC 99M TETROFOSMIN IV KIT
30.0000 | PACK | Freq: Once | INTRAVENOUS | Status: AC | PRN
  Administered 2022-01-16: 32 via INTRAVENOUS

## 2022-01-16 MED ORDER — REGADENOSON 0.4 MG/5ML IV SOLN
INTRAVENOUS | Status: AC
  Administered 2022-01-16: 0.4 mg via INTRAVENOUS
  Filled 2022-01-16: qty 5

## 2022-01-16 MED ORDER — SODIUM CHLORIDE FLUSH 0.9 % IV SOLN
INTRAVENOUS | Status: AC
  Administered 2022-01-16: 10 mL via INTRAVENOUS
  Filled 2022-01-16: qty 10

## 2022-01-17 ENCOUNTER — Telehealth: Payer: Self-pay

## 2022-01-17 NOTE — Telephone Encounter (Signed)
Patient notified and verbalized understanding. Pt had no questions or concerns at this time 

## 2022-01-17 NOTE — Telephone Encounter (Signed)
-----   Message from Satira Sark, MD sent at 01/17/2022  8:27 AM EST ----- Results reviewed.  Stress test was low risk, no substantial ischemic territories which would suggest continuing medical therapy and observation for now.  Keep follow-up as scheduled.

## 2022-02-14 ENCOUNTER — Other Ambulatory Visit: Payer: Self-pay

## 2022-02-14 ENCOUNTER — Ambulatory Visit: Payer: 59 | Admitting: Internal Medicine

## 2022-02-14 ENCOUNTER — Encounter: Payer: Self-pay | Admitting: Internal Medicine

## 2022-02-14 VITALS — BP 130/80 | HR 72 | Temp 97.5°F | Ht 74.0 in | Wt 190.8 lb

## 2022-02-14 DIAGNOSIS — K921 Melena: Secondary | ICD-10-CM

## 2022-02-14 LAB — HEMOGLOBIN AND HEMATOCRIT, BLOOD
HCT: 39.8 % (ref 38.5–50.0)
Hemoglobin: 13.1 g/dL — ABNORMAL LOW (ref 13.2–17.1)

## 2022-02-14 NOTE — Progress Notes (Signed)
Primary Care Physician:  Celene Squibb, MD Primary Gastroenterologist:  Dr.   Pre-Procedure History & Physical: HPI:  Paul Arias is a 56 y.o. male here for follow-up short segment Barrett's esophagus and a duodenal adenoma.  Patient has undergone multiple procedures for duodenal adenoma.  Resection (EMR) down in [place] previously.  Would follow-up revealing a small fragment of remaining adenoma which was removed.  No high-grade dysplasia on biopsies.  Surveillance EGD recommended at this time for both Barrett's and duodenal adenoma.  Patient has had intermittent rectal bleeding over the past several weeks.   No dysphagia.  Reflux well controlled on omeprazole 40 mg once daily. Has had history of multiple colonic adenomas removed over time with prior colonoscopy yielding 7 polyps.  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  Has not had any melena.  No significant hemorrhoidal disease was appreciated on his prior colonoscopy.    Past Medical History:  Diagnosis Date   Alcohol dependence in remission [facility])    Allergic rhinitis    Arthritis    Bipolar disorder ([place])    CAD (coronary artery disease)    Cardiac catheterization May 2021 - medical therapy recommended   Chronic pain    Colon polyp    Tubular adenoma   COPD (chronic obstructive pulmonary disease) (HCC)    Gastric polyp    Tubular adenoma   GERD (gastroesophageal reflux disease)    Hypertension    Mixed hyperlipidemia     Past Surgical History:  Procedure Laterality Date   BIOPSY  02/09/2020   Procedure: BIOPSY;  Surgeon: Daneil Dolin, MD;  [facility];  Service: Endoscopy;;   BIOPSY  12/17/2020   Procedure: BIOPSY;  Surgeon: Irving Copas., MD;  Location: [facility] ENDOSCOPY;  Service: Gastroenterology;;   COLONOSCOPY N/A 08/19/2016   Dr. Aviva Signs: 2 semi-pedunculated  polyps in the distal sigmoid colon removed. Measure 2-5 mm. Tubular adenomas.   COLONOSCOPY WITH PROPOFOL N/A 02/09/2020   7 polyps (tubular adenomas and hyperplastic)with recommended 5-year repeat (2026).   ENDOSCOPIC MUCOSAL RESECTION N/A 05/02/2020   Procedure: ENDOSCOPIC MUCOSAL RESECTION;  Surgeon: Rush Landmark Telford Nab., MD;  Location: [facility];  Service: Gastroenterology;  Laterality: N/A;   ESOPHAGOGASTRODUODENOSCOPY N/A 08/19/2016   Dr. Arnoldo Morale: Villous appearing mucosa in the first portion of the duodenum, polyp removed with hot snare, pathology tubular adenoma. Medium sized hiatal hernia. Biopsy for CLOtest which was negative.   ESOPHAGOGASTRODUODENOSCOPY (EGD) WITH PROPOFOL  08/20/2016   Dr. Gala Romney: assisted Dr. Arnoldo Morale emergently. Esophagus normal. Medium sized hiatal hernia. One nonbleeding cratered stellate-shaped gastric ulcer measuring 8 mm. One cratered ulcerated area with adherent clot in the second portion of the duodenum. 25 mm in diameter. Visible vessel seen. Status post bleeding control therapy. Heaped up margins consistent with incomplete removal.    ESOPHAGOGASTRODUODENOSCOPY (EGD) WITH PROPOFOL N/A 10/27/2016   PROPOFOL;  Surgeon: Daneil Dolin, MD; Barrett's esophagus without dysplasia or malignancy.  Normal stomach with previous gastric ulcer healed.  2 duodenal polyps revealing fragments of small bowel adenoma, otherwise normal.  Recommendations to repeat endoscopy in 1 year.   ESOPHAGOGASTRODUODENOSCOPY (EGD) WITH PROPOFOL N/A 02/09/2020   probable short segment Barrett's s/p biopsy, multiple adenomatous appearing duodenal lesions s/p biopsy.   ESOPHAGOGASTRODUODENOSCOPY (EGD) WITH PROPOFOL N/A 05/02/2020   small hiatal hernia, mucosal nodule in duodenum with complete  removal s/p piecemeal mucosal resection and avulsion s/p clips.   ESOPHAGOGASTRODUODENOSCOPY (EGD) WITH PROPOFOL N/A 12/17/2020   duodenal scar from prior EMR, small are of possible recurrence s/p  removal. One fragment of duodenal adenoma. Needs surveillance EGD Jan 2023.   FLEXIBLE SIGMOIDOSCOPY N/A 08/20/2016   Dr. Arnoldo Morale: Hematin found in mid sigmoid colon. One hemostatic clip placed.   HEMOSTASIS CLIP PLACEMENT  05/02/2020   Procedure: HEMOSTASIS CLIP PLACEMENT;  Surgeon: Irving Copas., MD;  Location: [facility];  Service: Gastroenterology;;   LEFT HEART CATH AND CORONARY ANGIOGRAPHY N/A 04/11/2020   Procedure: LEFT HEART CATH AND CORONARY ANGIOGRAPHY;  Surgeon: Sherren Mocha, MD;  Location: [place] CV LAB;  Service: Cardiovascular;  Laterality: N/A;   POLYPECTOMY  02/09/2020   Procedure: POLYPECTOMY;  Surgeon: Daneil Dolin, MD;  [facility];  Service: Endoscopy;;   POLYPECTOMY  12/17/2020   Procedure: POLYPECTOMY;  Surgeon: Irving Copas., MD;  Location: [place];  Service: Gastroenterology;;   SUBMUCOSAL LIFTING INJECTION  05/02/2020   Procedure: SUBMUCOSAL LIFTING INJECTION;  Surgeon: Irving Copas., MD;  Location: [facility];  Service: Gastroenterology;;    Prior to Admission medications   Medication Sig Start Date End Date Taking? Authorizing Provider  ALPRAZolam Duanne Moron) 1 MG tablet Take 2 mg by mouth at bedtime.  07/25/16  Yes [provider]  Calcium Carb-Cholecalciferol (CALCIUM 600/VITAMIN D3) 600-800 MG-UNIT TABS Take 1 tablet by mouth daily.   Yes [provider]  cetirizine (ZYRTEC) 10 MG tablet Take 10 mg by mouth daily.   Yes [provider]  fenofibrate 160 MG tablet Take 160 mg by mouth at bedtime.    Yes [provider]  HYDROcodone-acetaminophen (NORCO) 10-325 MG tablet Take 1 tablet by mouth 2 (two) times daily as needed (for pain).  07/11/16  Yes [provider]  magnesium oxide (MAG-OX) 400 MG tablet Take 400 mg by mouth daily.   Yes [provider]  metoprolol succinate (TOPROL-XL) 25 MG 24 hr tablet TAKE 1 TABLET BY MOUTH ONCE DAILY. 08/20/21  Yes  Satira Sark, MD  nitroGLYCERIN (NITROSTAT) 0.4 MG SL tablet Place 1 tablet (0.4 mg total) under the tongue every 5 (five) minutes as needed for chest pain. 04/06/20  Yes Barrett, Evelene Croon, PA-C  Omega-3 Fatty Acids (OMEGA-3 FISH OIL PO) Take 1 capsule by mouth 2 (two) times daily.   Yes [provider]  omeprazole (PRILOSEC) 40 MG capsule TAKE (1) CAPSULE BY MOUTH TWICE DAILY BEFORE A MEAL. 10/14/21  Yes Annitta Needs, NP  rosuvastatin (CRESTOR) 40 MG tablet TAKE (1) TABLET BY MOUTH AT BEDTIME FOR HIGH CHOLESTEROL. 08/20/21  Yes Satira Sark, MD  vitamin B-12 (CYANOCOBALAMIN) 500 MCG tablet Take 500 mcg by mouth daily.   Yes [provider]  Zinc 50 MG TABS Take 50 mg by mouth daily.    Yes [provider]    Allergies as of 02/14/2022   (No Known Allergies)    Family History  Problem Relation Age of Onset   Cirrhosis Mother    CAD Mother 33       Premature CAD   Heart disease Father 75       Premature CAD   Esophageal cancer Brother    Unexplained death Brother 39       died in his sleep   Colon cancer Neg Hx    Inflammatory bowel disease Neg Hx    Liver disease Neg Hx    Pancreatic  cancer Neg Hx    Stomach cancer Neg Hx     Social History   Socioeconomic History   Marital status: Single    Spouse name: Not on file   Number of children: 2   Years of education: Not on file   Highest education level: Not on file  Occupational History   Not on file  Tobacco Use   Smoking status: Former    Packs/day: 0.25    Years: 40.00    Pack years: 10.00    Types: Cigarettes    Quit date: 09/08/2019    Years since quitting: 2.4    Passive exposure: Never   Smokeless tobacco: Never  Vaping Use   Vaping Use: Never used  Substance and Sexual Activity   Alcohol use: Yes    Alcohol/week: 32.0 standard drinks    Types: 32 Cans of beer per week    Comment: daily   Drug use: No   Sexual activity: Never    Birth control/protection: None  Other  Topics Concern   Not on file  Social History Narrative   Not on file   Social Determinants of Health   Financial Resource Strain: Not on file  Food Insecurity: Not on file  Transportation Needs: Not on file  Physical Activity: Not on file  Stress: Not on file  Social Connections: Not on file  Intimate Partner Violence: Not on file    Review of Systems: See HPI, otherwise negative ROS  Physical Exam: BP 130/80 (BP Location: Left Arm, Patient Position: Sitting, Cuff Size: Large)    Pulse 72    Temp (!) 97.5 F (36.4 C) (Temporal)    Ht '6\' 2"'$  (1.88 m)    Wt 190 lb 12.8 oz (86.5 kg)    BMI 24.50 kg/m  General:   Alert,  Well-developed, well-nourished, pleasant and cooperative in NAD Mouth:  No deformity or lesions. Neck:  Supple; no masses or thyromegaly. No significant cervical adenopathy. Lungs:  Clear throughout to auscultation.   No wheezes, crackles, or rhonchi. No acute distress. Heart:  Regular rate and rhythm; no murmurs, clicks, rubs,  or gallops. Abdomen: Non-distended, normal bowel sounds.  Soft and nontender without appreciable mass or hepatosplenomegaly.    Impression/Plan: Short segment Barrett's esophagus in the setting of longstanding GERD's GERD symptoms quiescent on once daily omeprazole.  No dysphagia.  History of duodenal adenoma removed as outlined above.  Surveillance due at this time.  Intermittent paper hematochezia in the setting of straining.  History of multiple colonic adenomas removed previously.  No significant hemorrhoids seen on prior EGD.  Although I suspect blood is emanating from the anorectum due to benign source, cannot rule out the development of an interval lesion since his last colonoscopy.  Recommendations:  As discussed, we will be best to go ahead and perform a diagnostic colonoscopy along with a surveillance EGD in the near future. The risks, benefits, limitations, imponderables and alternatives regarding both EGD and colonoscopy have been  reviewed with the patient. Questions have been answered. All parties agreeable.   ASA 3.  Propofol.  Add Colace 100 mg twice daily to regimen.  Avoid straining.  Limit toilet time to 5 minutes.  Continue omeprazole 40 mg daily.  Further recommendations to follow.      Notice: This dictation was prepared with Dragon dictation along with smaller phrase technology. Any transcriptional errors that result from this process are unintentional and may not be corrected upon review.

## 2022-02-14 NOTE — Patient Instructions (Signed)
It was good to see you again today! ? ?Continue omeprazole 40 mg once daily before breakfast.  This should be adequate to control your reflux. ? ?GERD information provided ? ?Because of Barrett's esophagus and the duodenal polyp previously removed, we will schedule a repeat EGD at this time. ? ?At the same time of EGD we will go ahead and repeat a colonoscopy to reassess rectal bleeding.  ASA 3 propofol ? ?Limit toilet time to 5 minutes.  Avoid straining. ? ?Try Colace 100 mg twice daily-stool softener ? ?H&H today to make sure your hemoglobin and hematocrit continue to look good ? ?Further recommendations to follow. ? ?

## 2022-02-17 ENCOUNTER — Telehealth: Payer: Self-pay | Admitting: *Deleted

## 2022-02-17 ENCOUNTER — Encounter: Payer: Self-pay | Admitting: *Deleted

## 2022-02-17 MED ORDER — CLENPIQ 10-3.5-12 MG-GM -GM/160ML PO SOLN: 1.0000 | Freq: Once | ORAL | 0 refills | Status: AC

## 2022-02-17 NOTE — Telephone Encounter (Signed)
Called pt. Scheduled for TCS/EGD with propofol asa 3 on 3/30 at 10:45am. Aware will need pre-op appt prior. Will send rx prep to pharmacy. Will send instructions via mychart. ? ?PA approved via [employer]. Auth# [id number], DOS: Mar 06, 2022 - Jun 04, 2022 ?

## 2022-02-27 ENCOUNTER — Encounter: Payer: Self-pay | Admitting: Internal Medicine

## 2022-02-27 ENCOUNTER — Ambulatory Visit (INDEPENDENT_AMBULATORY_CARE_PROVIDER_SITE_OTHER): Payer: 59 | Admitting: Internal Medicine

## 2022-02-27 ENCOUNTER — Other Ambulatory Visit: Payer: Self-pay

## 2022-02-27 VITALS — BP 116/72 | HR 62 | Ht 74.0 in | Wt 195.0 lb

## 2022-02-27 DIAGNOSIS — Z8249 Family history of ischemic heart disease and other diseases of the circulatory system: Secondary | ICD-10-CM

## 2022-02-27 DIAGNOSIS — E7849 Other hyperlipidemia: Secondary | ICD-10-CM | POA: Diagnosis not present

## 2022-02-27 DIAGNOSIS — I2510 Atherosclerotic heart disease of native coronary artery without angina pectoris: Secondary | ICD-10-CM

## 2022-02-27 NOTE — Patient Instructions (Addendum)
Labwork: ?Fasting Lipid Panel ?NMR ?LPA ? ?Follow-Up: ?Follow up with Dr. Debara Pickett in 4 months. ? ?Any Other Special Instructions Will Be Listed Below (If Applicable). ? ? ? ? ?If you need a refill on your cardiac medications before your next appointment, please call your pharmacy. ? ?

## 2022-02-27 NOTE — Progress Notes (Signed)
? ? ?LIPID CLINIC CONSULT NOTE ? ?Chief Complaint:  ?Manage dyslipidemia ? ?Primary Care Physician: ?Celene Squibb, MD ? ?Primary Cardiologist:  ?Rozann Lesches, MD ? ?HPI:  ?Paul Arias is a 56 y.o. male who is being seen today for the evaluation of dyslipidemia at the request of Satira Sark, MD. 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  He is fairly active and denies any chest pain.  He did undergo CT coronary angiography back in 2021 and was found to have score of 1740, 99th percentile for age and sex matched control.  The coronaries were heavily calcified with severe stenosis in the proximal to ostial RCA and mid LAD.  FFR did not show any significant stenosis in the LAD or circumflex and was unable to be calculated in the right coronary artery.  Subsequently he underwent diagnostic cardiac catheterization in May 2021.  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  There was moderate calcific stenosis in the proximal to mid LAD and a patent circumflex OM system.  Medical therapy was recommended.  Stress testing to assess for progression of the LAD was recommended.  He recently underwent a Myoview stress test last month which was negative for ischemia.  He does get some shortness of breath but reports he has some mild COPD.  He reports a strong family history of heart disease including mom who had 7 vessel bypass in father who had an MI at age 104.  His lipids remain elevated with recent total cholesterol 212, triglycerides 192, HDL 55 and LDL 123.  If we were to take away his 40 mg rosuvastatin and fenofibrate, his LDL cholesterol would be much greater than 190, highly suggestive of familial hyperlipidemia.  In addition his family history is also suggestive of a familial hyperlipidemia.  He remains well above target LDL less than 70. ? ?PMHx:  ?Past Medical History:  ?Diagnosis Date  ? Alcohol dependence in remission  [facility])   ? Allergic rhinitis   ? Arthritis   ? Bipolar disorder ([place])   ? CAD (coronary artery disease)   ? Cardiac catheterization May 2021 - medical therapy recommended  ? Chronic pain   ? Colon polyp   ? Tubular adenoma  ? COPD (chronic obstructive pulmonary disease) ([place])   ? Gastric polyp   ? Tubular adenoma  ? GERD (gastroesophageal reflux disease)   ? Hypertension   ? Mixed hyperlipidemia   ? ? ?Past Surgical History:  ?Procedure Laterality Date  ? BIOPSY  02/09/2020  ? Procedure: BIOPSY;  Surgeon: Daneil Dolin, MD;  [facility];  Service: Endoscopy;;  ? BIOPSY  12/17/2020  ? Procedure: BIOPSY;  Surgeon: Irving Copas., MD;  Location: [place];  Service: Gastroenterology;;  ? COLONOSCOPY N/A 08/19/2016  ? Dr. Aviva Signs: 2 semi-pedunculated polyps in the distal sigmoid colon removed. Measure 2-5 mm. Tubular adenomas.  ? COLONOSCOPY WITH PROPOFOL N/A 02/09/2020  ? 7 polyps (tubular adenomas and hyperplastic)with recommended 5-year repeat (2026).  ? ENDOSCOPIC MUCOSAL RESECTION N/A 05/02/2020  ? Procedure: ENDOSCOPIC MUCOSAL RESECTION;  Surgeon: Rush Landmark Telford Nab., MD;  Location: [facility];  Service: Gastroenterology;  Laterality: N/A;  ? ESOPHAGOGASTRODUODENOSCOPY N/A 08/19/2016  ? Dr. Arnoldo Morale: Villous appearing mucosa in the first portion of the duodenum, polyp removed with hot snare, pathology tubular adenoma. Medium sized hiatal hernia. Biopsy for CLOtest which was negative.  ? ESOPHAGOGASTRODUODENOSCOPY (EGD) WITH PROPOFOL  08/20/2016  ? Dr.  Rourk: assisted Dr. Arnoldo Morale emergently. Esophagus normal. Medium sized hiatal hernia. One nonbleeding cratered stellate-shaped gastric ulcer measuring 8 mm. One cratered ulcerated area with adherent clot in the second portion of the duodenum. 25 mm in diameter. Visible vessel seen. Status post bleeding control therapy. Heaped up margins consistent with incomplete removal.   ? ESOPHAGOGASTRODUODENOSCOPY (EGD) WITH PROPOFOL N/A  10/27/2016  ? PROPOFOL;  Surgeon: Daneil Dolin, MD; Barrett's esophagus without dysplasia or malignancy.  Normal stomach with previous gastric ulcer healed.  2 duodenal polyps revealing fragments of small bowel adenoma, otherwise normal.  Recommendations to repeat endoscopy in 1 year.  ? ESOPHAGOGASTRODUODENOSCOPY (EGD) WITH PROPOFOL N/A 02/09/2020  ? probable short segment Barrett's s/p biopsy, multiple adenomatous appearing duodenal lesions s/p biopsy.  ? ESOPHAGOGASTRODUODENOSCOPY (EGD) WITH PROPOFOL N/A 05/02/2020  ? small hiatal hernia, mucosal nodule in duodenum with complete removal s/p piecemeal mucosal resection and avulsion s/p clips.  ? ESOPHAGOGASTRODUODENOSCOPY (EGD) WITH PROPOFOL N/A 12/17/2020  ? duodenal scar from prior EMR, small are of possible recurrence s/p removal. One fragment of duodenal adenoma. Needs surveillance EGD Jan 2023.  ? FLEXIBLE SIGMOIDOSCOPY N/A 08/20/2016  ? Dr. Arnoldo Morale: Hematin found in mid sigmoid colon. One hemostatic clip placed.  ? HEMOSTASIS CLIP PLACEMENT  05/02/2020  ? Procedure: HEMOSTASIS CLIP PLACEMENT;  Surgeon: Irving Copas., MD;  Location: [facility];  Service: Gastroenterology;;  ? LEFT HEART CATH AND CORONARY ANGIOGRAPHY N/A 04/11/2020  ? Procedure: LEFT HEART CATH AND CORONARY ANGIOGRAPHY;  Surgeon: Sherren Mocha, MD;  Location: [facility];  Service: Cardiovascular;  Laterality: N/A;  ? POLYPECTOMY  02/09/2020  ? Procedure: POLYPECTOMY;  Surgeon: Daneil Dolin, MD;  [facility];  Service: Endoscopy;;  ? POLYPECTOMY  12/17/2020  ? Procedure: POLYPECTOMY;  Surgeon: Mansouraty, Telford Nab., MD;  Location: [place];  Service: Gastroenterology;;  ? [place] INJECTION  05/02/2020  ? Procedure: SUBMUCOSAL LIFTING INJECTION;  Surgeon: Rush Landmark Telford Nab., MD;  Location: [facility];  Service: Gastroenterology;;  ? ? ?FAMHx:  ?Family History  ?Problem Relation Age of Onset  ? Cirrhosis Mother   ? CAD Mother 59  ?      Premature CAD  ? Heart disease Father 74  ?     Premature CAD  ? Esophageal cancer Brother   ? Unexplained death Brother 30  ?     died in his sleep  ? Colon cancer Neg Hx   ? Inflammatory bowel disease Neg Hx   ? Liver disease Neg Hx   ? Pancreatic cancer Neg Hx   ? Stomach cancer Neg Hx   ? ? ?SOCHx:  ? reports that he quit smoking about 2 years ago. His smoking use included cigarettes. He has a 10.00 pack-year smoking history. He has never been exposed to tobacco smoke. He has never used smokeless tobacco. He reports current alcohol use of about 32.0 standard drinks per week. He reports that he does not use drugs. ? ?ALLERGIES:  ?No Known Allergies ? ?ROS: ?Pertinent items noted in HPI and remainder of comprehensive ROS otherwise negative. ? ?HOME MEDS: ?Current Outpatient Medications on File Prior to Visit  ?Medication Sig Dispense Refill  ? ALPRAZolam (XANAX) 1 MG tablet Take 2 mg by mouth at bedtime.     ? BIOTIN PO Take 1 tablet by mouth daily. Vit b    ? Calcium Carb-Cholecalciferol (CALCIUM 600/VITAMIN D3) 600-800 MG-UNIT TABS Take 1 tablet by mouth daily.    ? cetirizine (ZYRTEC) 10 MG tablet Take  10 mg by mouth daily.    ? fenofibrate 160 MG tablet Take 160 mg by mouth at bedtime.     ? finasteride (PROPECIA) 1 MG tablet Take 1 mg by mouth daily.    ? HYDROcodone-acetaminophen (NORCO) 10-325 MG tablet Take 1 tablet by mouth every 6 (six) hours as needed (for pain).    ? magnesium oxide (MAG-OX) 400 MG tablet Take 400 mg by mouth daily.    ? metoprolol succinate (TOPROL-XL) 25 MG 24 hr tablet TAKE 1 TABLET BY MOUTH ONCE DAILY. 30 tablet 6  ? nitroGLYCERIN (NITROSTAT) 0.4 MG SL tablet Place 1 tablet (0.4 mg total) under the tongue every 5 (five) minutes as needed for chest pain. 25 tablet 3  ? Omega-3 Fatty Acids (OMEGA-3 FISH OIL PO) Take 1 capsule by mouth 2 (two) times daily.    ? omeprazole (PRILOSEC) 40 MG capsule TAKE (1) CAPSULE BY MOUTH TWICE DAILY BEFORE A MEAL. 180 capsule 3  ? rosuvastatin  (CRESTOR) 40 MG tablet TAKE (1) TABLET BY MOUTH AT BEDTIME FOR HIGH CHOLESTEROL. 30 tablet 6  ? vitamin B-12 (CYANOCOBALAMIN) 500 MCG tablet Take 500 mcg by mouth daily.    ? ?No current facility-administered med

## 2022-02-28 ENCOUNTER — Telehealth: Payer: Self-pay | Admitting: Internal Medicine

## 2022-02-28 NOTE — Telephone Encounter (Signed)
PA for repatha submitted via C[facility] ?(Key: [id number]) ? ?- clinical ASCVD ?- HeFH ?

## 2022-03-03 ENCOUNTER — Encounter (HOSPITAL_COMMUNITY): Payer: Self-pay

## 2022-03-03 ENCOUNTER — Other Ambulatory Visit: Payer: Self-pay

## 2022-03-03 MED ORDER — REPATHA SURECLICK 140 MG/ML ~~LOC~~ SOAJ
1.0000 | SUBCUTANEOUS | 3 refills | Status: DC
Start: 2022-03-03 — End: 2023-05-08

## 2022-03-03 NOTE — Addendum Note (Signed)
Addended by: Fidel Levy on: 03/03/2022 07:48 AM ? ? Modules accepted: Orders ? ?

## 2022-03-03 NOTE — Telephone Encounter (Signed)
Request Reference Number: [id number]. REPATHA SURE INJ '140MG'$ /ML is approved through 03/01/2023 ?

## 2022-03-03 NOTE — Patient Instructions (Addendum)
? ? ? ? ? ? ? ? Paul Arias ? 03/03/2022  ?  ? '@PREFPERIOPPHARMACY'$ @ ? ? Your procedure is scheduled on  03/06/2022. ? ? Report to [name] at  0930  A.M. ? ? Call this number if you have problems the morning of surgery: ? [phone] ? ? Remember: ? Follow the diet and prep instructions given to you by the office. ?  ? Take these medicines the morning of surgery with A SIP OF WATER  ? ?xanax(if needed),zyrtec, hydrocodone (if needed), metoprolol, prilosec. ? ?  ? Do not wear jewelry, make-up or nail polish. ? Do not wear lotions, powders, or perfumes, or deodorant. ? Do not shave 48 hours prior to surgery.  Men may shave face and neck. ? Do not bring valuables to the hospital. ?  is not responsible for any belongings or valuables. ? ?Contacts, dentures or bridgework may not be worn into surgery.  Leave your suitcase in the car.  After surgery it may be brought to your room. ? ?For patients admitted to the hospital, discharge time will be determined by your treatment team. ? ?Patients discharged the day of surgery will not be allowed to drive home and must have someone with them for 24 hours.  ? ? ?Special instructions:   DO NOT smoke tobacco or vape for 24 hours before your procedure. ? ?Please read over the following fact sheets that you were given. ?Anesthesia Post-op Instructions and Care and Recovery After Surgery ? ?  ? ? ? Upper Endoscopy, Adult, Care After ?This sheet gives you information about how to care for yourself after your procedure. Your health care provider may also give you more specific instructions. If you have problems or questions, contact your health care provider. ?What can I expect after the procedure? ?After the procedure, it is common to have: ?A sore throat. ?Mild stomach pain or discomfort. ?Bloating. ?Nausea. ?Follow these instructions at home: ? ?Follow instructions from your health care provider about what to eat or drink after your procedure. ?Return to your normal  activities as told by your health care provider. Ask your health care provider what activities are safe for you. ?Take over-the-counter and prescription medicines only as told by your health care provider. ?If you were given a sedative during the procedure, it can affect you for several hours. Do not drive or operate machinery until your health care provider says that it is safe. ?Keep all follow-up visits as told by your health care provider. This is important. ?Contact a health care provider if you have: ?A sore throat that lasts longer than one day. ?Trouble swallowing. ?Get help right away if: ?You vomit blood or your vomit looks like coffee grounds. ?You have: ?A fever. ?Bloody, black, or tarry stools. ?A severe sore throat or you cannot swallow. ?Difficulty breathing. ?Severe pain in your chest or abdomen. ?Summary ?After the procedure, it is common to have a sore throat, mild stomach discomfort, bloating, and nausea. ?If you were given a sedative during the procedure, it can affect you for several hours. Do not drive or operate machinery until your health care provider says that it is safe. ?Follow instructions from your health care provider about what to eat or drink after your procedure. ?Return to your normal activities as told by your health care provider. ?This information is not intended to replace advice given to you by your health care provider. Make sure you discuss any questions you have with your health care  provider. ?Document Revised: 09/30/2019 Document Reviewed: 04/26/2018 ?Elsevier Patient Education ? [place]. ?Colonoscopy, Adult, Care After ?This sheet gives you information about how to care for yourself after your procedure. Your health care provider may also give you more specific instructions. If you have problems or questions, contact your health care provider. ?What can I expect after the procedure? ?After the procedure, it is common to have: ?A small amount of blood in your  stool for 24 hours after the procedure. ?Some gas. ?Mild cramping or bloating of your abdomen. ?Follow these instructions at home: ?Eating and drinking ? ?Drink enough fluid to keep your urine pale yellow. ?Follow instructions from your health care provider about eating or drinking restrictions. ?Resume your normal diet as instructed by your health care provider. Avoid heavy or fried foods that are hard to digest. ?Activity ?Rest as told by your health care provider. ?Avoid sitting for a long time without moving. Get up to take short walks every 1-2 hours. This is important to improve blood flow and breathing. Ask for help if you feel weak or unsteady. ?Return to your normal activities as told by your health care provider. Ask your health care provider what activities are safe for you. ?Managing cramping and bloating ? ?Try walking around when you have cramps or feel bloated. ?Apply heat to your abdomen as told by your health care provider. Use the heat source that your health care provider recommends, such as a moist heat pack or a heating pad. ?Place a towel between your skin and the heat source. ?Leave the heat on for 20-30 minutes. ?Remove the heat if your skin turns bright red. This is especially important if you are unable to feel pain, heat, or cold. You may have a greater risk of getting burned. ?General instructions ?If you were given a sedative during the procedure, it can affect you for several hours. Do not drive or operate machinery until your health care provider says that it is safe. ?For the first 24 hours after the procedure: ?Do not sign important documents. ?Do not drink alcohol. ?Do your regular daily activities at a slower pace than normal. ?Eat soft foods that are easy to digest. ?Take over-the-counter and prescription medicines only as told by your health care provider. ?Keep all follow-up visits as told by your health care provider. This is important. ?Contact a health care provider if: ?You  have blood in your stool 2-3 days after the procedure. ?Get help right away if you have: ?More than a small spotting of blood in your stool. ?Large blood clots in your stool. ?Swelling of your abdomen. ?Nausea or vomiting. ?A fever. ?Increasing pain in your abdomen that is not relieved with medicine. ?Summary ?After the procedure, it is common to have a small amount of blood in your stool. You may also have mild cramping and bloating of your abdomen. ?If you were given a sedative during the procedure, it can affect you for several hours. Do not drive or operate machinery until your health care provider says that it is safe. ?Get help right away if you have a lot of blood in your stool, nausea or vomiting, a fever, or increased pain in your abdomen. ?This information is not intended to replace advice given to you by your health care provider. Make sure you discuss any questions you have with your health care provider. ?Document Revised: 09/30/2019 Document Reviewed: 06/20/2019 ?Elsevier Patient Education ? [place]. ?Monitored Anesthesia Care, Care After ?This sheet gives  you information about how to care for yourself after your procedure. Your health care provider may also give you more specific instructions. If you have problems or questions, contact your health care provider. ?What can I expect after the procedure? ?After the procedure, it is common to have: ?Tiredness. ?Forgetfulness about what happened after the procedure. ?Impaired judgment for important decisions. ?Nausea or vomiting. ?Some difficulty with balance. ?Follow these instructions at home: ?For the time period you were told by your health care provider: ?  ?Rest as needed. ?Do not participate in activities where you could fall or become injured. ?Do not drive or use machinery. ?Do not drink alcohol. ?Do not take sleeping pills or medicines that cause drowsiness. ?Do not make important decisions or sign legal documents. ?Do not take care of  children on your own. ?Eating and drinking ?Follow the diet that is recommended by your health care provider. ?Drink enough fluid to keep your urine pale yellow. ?If you vomit: ?Drink water, juice, or

## 2022-03-04 ENCOUNTER — Encounter (HOSPITAL_COMMUNITY)
Admission: RE | Admit: 2022-03-04 | Discharge: 2022-03-04 | Disposition: A | Discharge status: Home / Self Care | Payer: 59 | Source: Ambulatory Visit | Attending: Internal Medicine | Admitting: Internal Medicine

## 2022-03-04 ENCOUNTER — Other Ambulatory Visit (HOSPITAL_COMMUNITY)
Admission: RE | Admit: 2022-03-04 | Discharge: 2022-03-04 | Disposition: A | Discharge status: Home / Self Care | Payer: 59 | Source: Ambulatory Visit | Attending: Internal Medicine | Admitting: Internal Medicine

## 2022-03-04 DIAGNOSIS — E7849 Other hyperlipidemia: Secondary | ICD-10-CM | POA: Diagnosis present

## 2022-03-05 ENCOUNTER — Telehealth: Payer: Self-pay | Admitting: *Deleted

## 2022-03-05 LAB — LIPOPROTEIN A (LPA): Lipoprotein (a): <8.4 nmol/L (ref <75.0)

## 2022-03-05 NOTE — Telephone Encounter (Signed)
Pt called in. He is on for TCS/EGD tomorrow with Dr. Gala Romney and he did not start his clear liquid diet. He has been eating all day. Pt aware will need to r/s. Advised will call once we receive that schedule. ?

## 2022-03-06 ENCOUNTER — Ambulatory Visit (HOSPITAL_COMMUNITY): Admission: RE | Admit: 2022-03-06 | Payer: 59 | Source: Home / Self Care | Admitting: Internal Medicine

## 2022-03-06 ENCOUNTER — Encounter (HOSPITAL_COMMUNITY): Admission: RE | Payer: Self-pay | Source: Home / Self Care

## 2022-03-06 SURGERY — COLONOSCOPY WITH PROPOFOL
Anesthesia: Monitor Anesthesia Care

## 2022-03-07 LAB — NMR, LIPOPROFILE
Cholesterol, Total: 194 mg/dL (ref 100–199)
HDL Cholesterol by NMR: 44 mg/dL (ref >39)
HDL Particle Number: 35.9 umol/L (ref >=30.5)
LDL Particle Number: 1405 nmol/L — ABNORMAL HIGH (ref <1000)
LDL Size: 20.7 nm (ref >20.5)
LDL-C (NIH Calc): 107 mg/dL — ABNORMAL HIGH (ref 0–99)
LP-IR Score: 89 — ABNORMAL HIGH (ref <=45)
Small LDL Particle Number: 863 nmol/L — ABNORMAL HIGH (ref <=527)
Triglycerides by NMR: 253 mg/dL — ABNORMAL HIGH (ref 0–149)

## 2022-03-10 ENCOUNTER — Other Ambulatory Visit: Payer: Self-pay | Admitting: *Deleted

## 2022-03-10 ENCOUNTER — Telehealth: Payer: Self-pay | Admitting: Internal Medicine

## 2022-03-10 DIAGNOSIS — E7849 Other hyperlipidemia: Secondary | ICD-10-CM

## 2022-03-10 NOTE — Telephone Encounter (Signed)
Pt notified via MyChart of lab results  ?

## 2022-03-10 NOTE — Telephone Encounter (Signed)
Patient walked into the office requesting recent test results . (Labs)  ?

## 2022-03-11 ENCOUNTER — Telehealth: Payer: Self-pay | Admitting: Internal Medicine

## 2022-03-11 NOTE — Telephone Encounter (Signed)
Pt called to reschedule his colonoscopy with [facility]. [phone] ?

## 2022-03-12 ENCOUNTER — Telehealth: Payer: Self-pay | Admitting: Internal Medicine

## 2022-03-12 ENCOUNTER — Encounter: Payer: Self-pay | Admitting: *Deleted

## 2022-03-12 MED ORDER — CLENPIQ 10-3.5-12 MG-GM -GM/160ML PO SOLN: 1.0000 | Freq: Once | ORAL | 0 refills | Status: AC

## 2022-03-12 NOTE — Telephone Encounter (Signed)
Patient returned call. He has been scheduled for 5/4 at 2pm. He needs new prep rx sent. Advised will mail instructions/pre-op appt. ? ?PA approved via [place]. Auth# [id number], DOS: Mar 06, 2022 - Jun 04, 2022 ?

## 2022-03-12 NOTE — Telephone Encounter (Signed)
See prior note

## 2022-03-12 NOTE — Telephone Encounter (Signed)
Pt aware will call once we receive schedule  ?

## 2022-03-12 NOTE — Addendum Note (Signed)
Addended by: Cheron Every on: 03/12/2022 01:37 PM ? ? Modules accepted: Orders ? ?

## 2022-03-12 NOTE — Telephone Encounter (Signed)
LMOVM to call back 

## 2022-03-12 NOTE — Telephone Encounter (Signed)
Pt returning call. [phone] ?

## 2022-04-07 NOTE — Patient Instructions (Signed)
? ? ? ? ? ? ? ? Paul Arias ? 04/07/2022  ?  ? '@PREFPERIOPPHARMACY'$ @ ? ? Your procedure is scheduled on  04/10/2022. ? ? Report to Paul Arias at  1230  P.M. ? ? Call this number if you have problems the morning of surgery: ? [phone] ? ? Remember: ? Follow the diet and prep instructions given to you by the office. ?  ? Take these medicines the morning of surgery with A SIP OF WATER  ? ?            hydrocodone(If needed), metoprolol, prilosec. ?  ? ? Do not wear jewelry, make-up or nail polish. ? Do not wear lotions, powders, or perfumes, or deodorant. ? Do not shave 48 hours prior to surgery.  Men may shave face and neck. ? Do not bring valuables to the hospital. ?  is not responsible for any belongings or valuables. ? ?Contacts, dentures or bridgework may not be worn into surgery.  Leave your suitcase in the car.  After surgery it may be brought to your room. ? ?For patients admitted to the hospital, discharge time will be determined by your treatment team. ? ?Patients discharged the day of surgery will not be allowed to drive home and must have someone with them for 24 hours.  ? ? ?Special instructions:   DO NOT smoke tobacco or vape for 24 hours before your procedure. ? ?Please read over the following fact sheets that you were given. ?Anesthesia Post-op Instructions and Care and Recovery After Surgery ?  ? ? ? Upper Endoscopy, Adult, Care After ?This sheet gives you information about how to care for yourself after your procedure. Your health care provider may also give you more specific instructions. If you have problems or questions, contact your health care provider. ?What can I expect after the procedure? ?After the procedure, it is common to have: ?A sore throat. ?Mild stomach pain or discomfort. ?Bloating. ?Nausea. ?Follow these instructions at home: ? ?Follow instructions from your health care provider about what to eat or drink after your procedure. ?Return to your normal activities as  told by your health care provider. Ask your health care provider what activities are safe for you. ?Take over-the-counter and prescription medicines only as told by your health care provider. ?If you were given a sedative during the procedure, it can affect you for several hours. Do not drive or operate machinery until your health care provider says that it is safe. ?Keep all follow-up visits as told by your health care provider. This is important. ?Contact a health care provider if you have: ?A sore throat that lasts longer than one day. ?Trouble swallowing. ?Get help right away if: ?You vomit blood or your vomit looks like coffee grounds. ?You have: ?A fever. ?Bloody, black, or tarry stools. ?A severe sore throat or you cannot swallow. ?Difficulty breathing. ?Severe pain in your chest or abdomen. ?Summary ?After the procedure, it is common to have a sore throat, mild stomach discomfort, bloating, and nausea. ?If you were given a sedative during the procedure, it can affect you for several hours. Do not drive or operate machinery until your health care provider says that it is safe. ?Follow instructions from your health care provider about what to eat or drink after your procedure. ?Return to your normal activities as told by your health care provider. ?This information is not intended to replace advice given to you by your health care provider. Make sure you discuss  any questions you have with your health care provider. ?Document Revised: 09/30/2019 Document Reviewed: 04/26/2018 ?Elsevier Patient Education ? [place]. ?Colonoscopy, Adult, Care After ?The following information offers guidance on how to care for yourself after your procedure. Your health care provider may also give you more specific instructions. If you have problems or questions, contact your health care provider. ?What can I expect after the procedure? ?After the procedure, it is common to have: ?A small amount of blood in your stool for  24 hours after the procedure. ?Some gas. ?Mild cramping or bloating of your abdomen. ?Follow these instructions at home: ?Eating and drinking ? ?Drink enough fluid to keep your urine pale yellow. ?Follow instructions from your health care provider about eating or drinking restrictions. ?Resume your normal diet as told by your health care provider. Avoid heavy or fried foods that are hard to digest. ?Activity ?Rest as told by your health care provider. ?Avoid sitting for a long time without moving. Get up to take short walks every 1-2 hours. This is important to improve blood flow and breathing. Ask for help if you feel weak or unsteady. ?Return to your normal activities as told by your health care provider. Ask your health care provider what activities are safe for you. ?Managing cramping and bloating ? ?Try walking around when you have cramps or feel bloated. ?If directed, apply heat to your abdomen as told by your health care provider. Use the heat source that your health care provider recommends, such as a moist heat pack or a heating pad. ?Place a towel between your skin and the heat source. ?Leave the heat on for 20-30 minutes. ?Remove the heat if your skin turns bright red. This is especially important if you are unable to feel pain, heat, or cold. You have a greater risk of getting burned. ?General instructions ?If you were given a sedative during the procedure, it can affect you for several hours. Do not drive or operate machinery until your health care provider says that it is safe. ?For the first 24 hours after the procedure: ?Do not sign important documents. ?Do not drink alcohol. ?Do your regular daily activities at a slower pace than normal. ?Eat soft foods that are easy to digest. ?Take over-the-counter and prescription medicines only as told by your health care provider. ?Keep all follow-up visits. This is important. ?Contact a health care provider if: ?You have blood in your stool 2-3 days after the  procedure. ?Get help right away if: ?You have more than a small spotting of blood in your stool. ?You have large blood clots in your stool. ?You have swelling of your abdomen. ?You have nausea or vomiting. ?You have a fever. ?You have increasing pain in your abdomen that is not relieved with medicine. ?These symptoms may be an emergency. Get help right away. Call 911. ?Do not wait to see if the symptoms will go away. ?Do not drive yourself to the hospital. ?Summary ?After the procedure, it is common to have a small amount of blood in your stool. You may also have mild cramping and bloating of your abdomen. ?If you were given a sedative during the procedure, it can affect you for several hours. Do not drive or operate machinery until your health care provider says that it is safe. ?Get help right away if you have a lot of blood in your stool, nausea or vomiting, a fever, or increased pain in your abdomen. ?This information is not intended to replace advice  given to you by your health care provider. Make sure you discuss any questions you have with your health care provider. ?Document Revised: 07/17/2021 Document Reviewed: 07/17/2021 ?Elsevier Patient Education ? [place]. ?Monitored Anesthesia Care, Care After ?This sheet gives you information about how to care for yourself after your procedure. Your health care provider may also give you more specific instructions. If you have problems or questions, contact your health care provider. ?What can I expect after the procedure? ?After the procedure, it is common to have: ?Tiredness. ?Forgetfulness about what happened after the procedure. ?Impaired judgment for important decisions. ?Nausea or vomiting. ?Some difficulty with balance. ?Follow these instructions at home: ?For the time period you were told by your health care provider: ? ?  ? ?Rest as needed. ?Do not participate in activities where you could fall or become injured. ?Do not drive or use machinery. ?Do  not drink alcohol. ?Do not take sleeping pills or medicines that cause drowsiness. ?Do not make important decisions or sign legal documents. ?Do not take care of children on your own. ?Eating and drinki

## 2022-04-08 ENCOUNTER — Encounter (HOSPITAL_COMMUNITY)
Admission: RE | Admit: 2022-04-08 | Discharge: 2022-04-08 | Disposition: A | Discharge status: Home / Self Care | Payer: 59 | Source: Ambulatory Visit | Attending: Internal Medicine | Admitting: Internal Medicine

## 2022-04-08 ENCOUNTER — Encounter (HOSPITAL_COMMUNITY): Payer: Self-pay

## 2022-04-10 ENCOUNTER — Ambulatory Visit (HOSPITAL_COMMUNITY): Payer: 59 | Admitting: Anesthesiology

## 2022-04-10 ENCOUNTER — Ambulatory Visit (HOSPITAL_COMMUNITY)
Admission: RE | Admit: 2022-04-10 | Discharge: 2022-04-10 | Disposition: A | Discharge status: Home / Self Care | Payer: 59 | Attending: Internal Medicine | Admitting: Internal Medicine

## 2022-04-10 ENCOUNTER — Ambulatory Visit (HOSPITAL_BASED_OUTPATIENT_CLINIC_OR_DEPARTMENT_OTHER): Payer: 59 | Admitting: Anesthesiology

## 2022-04-10 ENCOUNTER — Encounter (HOSPITAL_COMMUNITY): Payer: Self-pay | Admitting: Internal Medicine

## 2022-04-10 ENCOUNTER — Encounter (HOSPITAL_COMMUNITY)
Admission: RE | Disposition: A | Discharge status: Home / Self Care | Payer: Self-pay | Source: Home / Self Care | Attending: Internal Medicine

## 2022-04-10 DIAGNOSIS — K449 Diaphragmatic hernia without obstruction or gangrene: Secondary | ICD-10-CM | POA: Diagnosis not present

## 2022-04-10 DIAGNOSIS — K635 Polyp of colon: Secondary | ICD-10-CM

## 2022-04-10 DIAGNOSIS — I2510 Atherosclerotic heart disease of native coronary artery without angina pectoris: Secondary | ICD-10-CM | POA: Diagnosis not present

## 2022-04-10 DIAGNOSIS — Z1211 Encounter for screening for malignant neoplasm of colon: Secondary | ICD-10-CM | POA: Insufficient documentation

## 2022-04-10 DIAGNOSIS — K640 First degree hemorrhoids: Secondary | ICD-10-CM

## 2022-04-10 DIAGNOSIS — K219 Gastro-esophageal reflux disease without esophagitis: Secondary | ICD-10-CM | POA: Diagnosis not present

## 2022-04-10 DIAGNOSIS — Z87891 Personal history of nicotine dependence: Secondary | ICD-10-CM | POA: Diagnosis not present

## 2022-04-10 DIAGNOSIS — Z86010 Personal history of colonic polyps: Secondary | ICD-10-CM | POA: Diagnosis not present

## 2022-04-10 DIAGNOSIS — J449 Chronic obstructive pulmonary disease, unspecified: Secondary | ICD-10-CM | POA: Insufficient documentation

## 2022-04-10 DIAGNOSIS — I10 Essential (primary) hypertension: Secondary | ICD-10-CM | POA: Insufficient documentation

## 2022-04-10 DIAGNOSIS — K2270 Barrett's esophagus without dysplasia: Secondary | ICD-10-CM | POA: Insufficient documentation

## 2022-04-10 DIAGNOSIS — Z09 Encounter for follow-up examination after completed treatment for conditions other than malignant neoplasm: Secondary | ICD-10-CM | POA: Diagnosis not present

## 2022-04-10 DIAGNOSIS — D123 Benign neoplasm of transverse colon: Secondary | ICD-10-CM | POA: Insufficient documentation

## 2022-04-10 DIAGNOSIS — Z8711 Personal history of peptic ulcer disease: Secondary | ICD-10-CM | POA: Diagnosis not present

## 2022-04-10 HISTORY — PX: POLYPECTOMY: SHX5525

## 2022-04-10 HISTORY — PX: ESOPHAGOGASTRODUODENOSCOPY (EGD) WITH PROPOFOL: SHX5813

## 2022-04-10 HISTORY — PX: BIOPSY: SHX5522

## 2022-04-10 HISTORY — PX: COLONOSCOPY WITH PROPOFOL: SHX5780

## 2022-04-10 SURGERY — COLONOSCOPY WITH PROPOFOL
Anesthesia: General

## 2022-04-10 MED ORDER — LACTATED RINGERS IV SOLN
INTRAVENOUS | Status: DC
  Administered 2022-04-10: 1000 mL via INTRAVENOUS

## 2022-04-10 MED ORDER — PHENYLEPHRINE HCL (PRESSORS) 10 MG/ML IV SOLN
INTRAVENOUS | Status: DC | PRN
  Administered 2022-04-10: 160 ug via INTRAVENOUS

## 2022-04-10 MED ORDER — LIDOCAINE HCL (PF) 2 % IJ SOLN
INTRAMUSCULAR | Status: AC
  Filled 2022-04-10: qty 35

## 2022-04-10 MED ORDER — LACTATED RINGERS IV SOLN
INTRAVENOUS | Status: DC | PRN
Start: 2022-04-10 — End: 2022-04-10

## 2022-04-10 MED ORDER — LIDOCAINE HCL (CARDIAC) PF 100 MG/5ML IV SOSY
PREFILLED_SYRINGE | INTRAVENOUS | Status: DC | PRN
  Administered 2022-04-10: 60 mg via INTRAVENOUS

## 2022-04-10 MED ORDER — EPHEDRINE 5 MG/ML INJ
INTRAVENOUS | Status: AC
  Filled 2022-04-10: qty 5

## 2022-04-10 MED ORDER — PROPOFOL 500 MG/50ML IV EMUL
INTRAVENOUS | Status: AC
  Filled 2022-04-10: qty 100

## 2022-04-10 MED ORDER — PROPOFOL 500 MG/50ML IV EMUL
INTRAVENOUS | Status: DC | PRN
  Administered 2022-04-10: 175 ug/kg/min via INTRAVENOUS

## 2022-04-10 MED ORDER — PROPOFOL 10 MG/ML IV BOLUS
INTRAVENOUS | Status: DC | PRN
  Administered 2022-04-10: 100 mg via INTRAVENOUS
  Administered 2022-04-10: 50 mg via INTRAVENOUS

## 2022-04-10 MED ORDER — PHENYLEPHRINE 80 MCG/ML (10ML) SYRINGE FOR IV PUSH (FOR BLOOD PRESSURE SUPPORT)
PREFILLED_SYRINGE | INTRAVENOUS | Status: AC
  Filled 2022-04-10: qty 20

## 2022-04-10 MED ORDER — PROPOFOL 500 MG/50ML IV EMUL
INTRAVENOUS | Status: AC
  Filled 2022-04-10: qty 50

## 2022-04-10 NOTE — Transfer of Care (Signed)
Immediate Anesthesia Transfer of Care Note ? ?Patient: Paul Arias ? ?Procedure(s) Performed: COLONOSCOPY WITH PROPOFOL ?ESOPHAGOGASTRODUODENOSCOPY (EGD) WITH PROPOFOL ?POLYPECTOMY ?BIOPSY ? ?Patient Location: Short Stay ? ?Anesthesia Type:General ? ?Level of Consciousness: sedated ? ?Airway & Oxygen Therapy: Patient Spontanous Breathing ? ?Post-op Assessment: Report given to RN and Post -op Vital signs reviewed and stable ? ?Post vital signs: Reviewed and stable ? ?Last Vitals:  ?Vitals Value Taken Time  ?BP    ?Temp    ?Pulse    ?Resp    ?SpO2    ? ? ?Last Pain:  ?Vitals:  ? 04/10/22 1130  ?TempSrc: Oral  ?PainSc: 0-No pain  ?   ? ?Patients Stated Pain Goal: 8 (04/10/22 1130) ? ?Complications: No notable events documented. ?

## 2022-04-10 NOTE — Discharge Instructions (Signed)
?Colonoscopy ?Discharge Instructions ? ?Read the instructions outlined below and refer to this sheet in the next few weeks. These discharge instructions provide you with general information on caring for yourself after you leave the hospital. Your doctor may also give you specific instructions. While your treatment has been planned according to the most current medical practices available, unavoidable complications occasionally occur. If you have any problems or questions after discharge, call Dr. Gala Romney at [phone]. ?ACTIVITY ?You may resume your regular activity, but move at a slower pace for the next 24 hours.  ?Take frequent rest periods for the next 24 hours.  ?Walking will help get rid of the air and reduce the bloated feeling in your belly (abdomen).  ?No driving for 24 hours (because of the medicine (anesthesia) used during the test).   ?Do not sign any important legal documents or operate any machinery for 24 hours (because of the anesthesia used during the test).  ?NUTRITION ?Drink plenty of fluids.  ?You may resume your normal diet as instructed by your doctor.  ?Begin with a light meal and progress to your normal diet. Heavy or fried foods are harder to digest and may make you feel sick to your stomach (nauseated).  ?Avoid alcoholic beverages for 24 hours or as instructed.  ?MEDICATIONS ?You may resume your normal medications unless your doctor tells you otherwise.  ?WHAT YOU CAN EXPECT TODAY ?Some feelings of bloating in the abdomen.  ?Passage of more gas than usual.  ?Spotting of blood in your stool or on the toilet paper.  ?IF YOU HAD POLYPS REMOVED DURING THE COLONOSCOPY: ?No aspirin products for 7 days or as instructed.  ?No alcohol for 7 days or as instructed.  ?Eat a soft diet for the next 24 hours.  ?FINDING OUT THE RESULTS OF YOUR TEST ?Not all test results are available during your visit. If your test results are not back during the visit, make an appointment with your caregiver to find out the  results. Do not assume everything is normal if you have not heard from your caregiver or the medical facility. It is important for you to follow up on all of your test results.  ?SEEK IMMEDIATE MEDICAL ATTENTION IF: ?You have more than a spotting of blood in your stool.  ?Your belly is swollen (abdominal distention).  ?You are nauseated or vomiting.  ?You have a temperature over 101.  ?You have abdominal pain or discomfort that is severe or gets worse throughout the day.   ?EGD ?Discharge instructions ?Please read the instructions outlined below and refer to this sheet in the next few weeks. These discharge instructions provide you with general information on caring for yourself after you leave the hospital. Your doctor may also give you specific instructions. While your treatment has been planned according to the most current medical practices available, unavoidable complications occasionally occur. If you have any problems or questions after discharge, please call your doctor. ?ACTIVITY ?You may resume your regular activity but move at a slower pace for the next 24 hours.  ?Take frequent rest periods for the next 24 hours.  ?Walking will help expel (get rid of) the air and reduce the bloated feeling in your abdomen.  ?No driving for 24 hours (because of the anesthesia (medicine) used during the test).  ?You may shower.  ?Do not sign any important legal documents or operate any machinery for 24 hours (because of the anesthesia used during the test).  ?NUTRITION ?Drink plenty of fluids.  ?You may  resume your normal diet.  ?Begin with a light meal and progress to your normal diet.  ?Avoid alcoholic beverages for 24 hours or as instructed by your caregiver.  ?MEDICATIONS ?You may resume your normal medications unless your caregiver tells you otherwise.  ?WHAT YOU CAN EXPECT TODAY ?You may experience abdominal discomfort such as a feeling of fullness or ?gas? pains.  ?FOLLOW-UP ?Your doctor will discuss the results of  your test with you.  ?SEEK IMMEDIATE MEDICAL ATTENTION IF ANY OF THE FOLLOWING OCCUR: ?Excessive nausea (feeling sick to your stomach) and/or vomiting.  ?Severe abdominal pain and distention (swelling).  ?Trouble swallowing.  ?Temperature over 101? F (37.8? C).  ?Rectal bleeding or vomiting of blood.   ? ?1 polyp removed from your colon today.  You do have internal hemorrhoids. ? ?  Small polyp in your duodenum removed.  Your Barrett's esophagus looked the same.  Biopsies taken. ? ?  Further recommendations to follow pending review of pathology report ? ? office visit with [place] in 3 months ? ? at patient request, I called Sharyn Lull at [phone] findings and recommendations ?

## 2022-04-10 NOTE — Op Note (Signed)
[facility] ?Patient Name: Paul Arias ?Procedure Date: 04/10/2022 11:33 AM ?MRN: [id number] ?Date of Birth: 1966/10/28 ?Attending MD: Norvel Richards , MD ?CSN: [id number] ?Age: 56 ?Admit Type: Outpatient ?Procedure:                Colonoscopy ?Indications:              High risk colon cancer surveillance: Personal  ?                          history of colonic polyps ?Providers:                Norvel Richards, MD, Janeece Riggers, RN, Eugene Garnet  ?                          Shanon Brow, Technician ?Referring MD:              ?Medicines:                Propofol per Anesthesia ?Complications:            No immediate complications. ?Estimated Blood Loss:     Estimated blood loss was minimal. ?Procedure:                Pre-Anesthesia Assessment: ?                          - Prior to the procedure, a History and Physical  ?                          was performed, and patient medications and  ?                          allergies were reviewed. The patient's tolerance of  ?                          previous anesthesia was also reviewed. The risks  ?                          and benefits of the procedure and the sedation  ?                          options and risks were discussed with the patient.  ?                          All questions were answered, and informed consent  ?                          was obtained. Prior Anticoagulants: The patient has  ?                          taken no previous anticoagulant or antiplatelet  ?                          agents. ASA Grade Assessment: III - A patient with  ?  severe systemic disease. After reviewing the risks  ?                          and benefits, the patient was deemed in  ?                          satisfactory condition to undergo the procedure. ?                          After obtaining informed consent, the colonoscope  ?                          was passed under direct vision. Throughout the  ?                          procedure, the  patient's blood pressure, pulse, and  ?                          oxygen saturations were monitored continuously. The  ?                          [id number]) scope was introduced through the  ?                          anus and advanced to the the cecum, identified by  ?                          appendiceal orifice and ileocecal valve. The  ?                          ileocecal valve, appendiceal orifice, and rectum  ?                          were photographed. ?Scope In: 12:13:40 PM ?Scope Out: 12:23:29 PM ?Scope Withdrawal Time: 0 hours 6 minutes 29 seconds  ?Total Procedure Duration: 0 hours 9 minutes 49 seconds  ?Findings: ?     The perianal and digital rectal examinations were normal. ?     Non-bleeding internal hemorrhoids were found during retroflexion. The  ?     hemorrhoids were moderate, medium-sized and Grade I (internal  ?     hemorrhoids that do not prolapse). ?     A 8 mm polyp was found in the splenic flexure. The polyp was sessile.  ?     The polyp was removed with a cold snare. Resection and retrieval were  ?     complete. Estimated blood loss was minimal. ?     The exam was otherwise without abnormality on direct and retroflexion  ?     views. ?Impression:               - Non-bleeding internal hemorrhoids. ?                          - One 8 mm polyp in the splenic flexure, removed  ?  with a cold snare. Resected and retrieved. ?                          - The examination was otherwise normal on direct  ?                          and retroflexion views. ?Moderate Sedation: ?     Moderate (conscious) sedation was personally administered by an  ?     anesthesia professional. The following parameters were monitored: oxygen  ?     saturation, heart rate, blood pressure, respiratory rate, EKG, adequacy  ?     of pulmonary ventilation, and response to care. ?Recommendation:           - Patient has a contact number available for  ?                          emergencies. The  signs and symptoms of potential  ?                          delayed complications were discussed with the  ?                          patient. Return to normal activities tomorrow.  ?                          Written discharge instructions were provided to the  ?                          patient. ?                          - Resume previous diet. ?                          - Continue present medications. ?                          - Repeat colonoscopy date to be determined after  ?                          pending pathology results are reviewed for  ?                          surveillance. ?                          - Return to GI office in 3 months. ?Procedure Code(s):        --- Professional --- ?                          [phone], Colonoscopy, flexible; with removal of  ?                          tumor(s), polyp(s), or other lesion(s) by snare  ?  technique ?Diagnosis Code(s):        --- Professional --- ?                          Z86.010, Personal history of colonic polyps ?                          K63.5, Polyp of colon ?                          K64.0, First degree hemorrhoids ?CPT copyright 2019 [employer] Medical Association. All rights reserved. ?The codes documented in this report are preliminary and upon coder review may  ?be revised to meet current compliance requirements. ?[name]. Tilford Deaton, MD ?Norvel Richards, MD ?04/10/2022 12:32:54 PM ?This report has been signed electronically. ?Number of Addenda: 0 ?

## 2022-04-10 NOTE — Anesthesia Preprocedure Evaluation (Signed)
Anesthesia Evaluation  ?Patient identified by MRN, date of birth, ID band ?Patient awake ? ? ? ?Reviewed: ?Allergy & Precautions, H&P , NPO status , Patient's Chart, lab work & pertinent test results, reviewed documented beta blocker date and time  ? ?Airway ?Mallampati: II ? ?TM Distance: >3 FB ?Neck ROM: full ? ? ? Dental ?no notable dental hx. ? ?  ?Pulmonary ?COPD, former smoker,  ?  ?Pulmonary exam normal ?breath sounds clear to auscultation ? ? ? ? ? ? Cardiovascular ?Exercise Tolerance: Good ?hypertension, + CAD  ? ?Rhythm:regular Rate:Normal ? ? ?  ?Neuro/Psych ?PSYCHIATRIC DISORDERS Bipolar Disorder negative neurological ROS ?   ? GI/Hepatic ?Neg liver ROS, PUD, GERD  Medicated,  ?Endo/Other  ?negative endocrine ROS ? Renal/GU ?negative Renal ROS  ?negative genitourinary ?  ?Musculoskeletal ? ? Abdominal ?  ?Peds ? Hematology ? ?(+) Blood dyscrasia, anemia ,   ?Anesthesia Other Findings ? ? Reproductive/Obstetrics ?negative OB ROS ? ?  ? ? ? ? ? ? ? ? ? ? ? ? ? ?  ?  ? ? ? ? ? ? ? ? ?Anesthesia Physical ?Anesthesia Plan ? ?ASA: 2 ? ?Anesthesia Plan: General  ? ?Post-op Pain Management:   ? ?Induction:  ? ?PONV Risk Score and Plan: Propofol infusion ? ?Airway Management Planned:  ? ?Additional Equipment:  ? ?Intra-op Plan:  ? ?Post-operative Plan:  ? ?Informed Consent: I have reviewed the patients History and Physical, chart, labs and discussed the procedure including the risks, benefits and alternatives for the proposed anesthesia with the patient or authorized representative who has indicated his/her understanding and acceptance.  ? ? ? ?Dental Advisory Given ? ?Plan Discussed with: CRNA ? ?Anesthesia Plan Comments:   ? ? ? ? ? ? ?Anesthesia Quick Evaluation ? ?

## 2022-04-10 NOTE — Op Note (Signed)
[facility] ?Patient Name: Paul Arias ?Procedure Date: 04/10/2022 11:34 AM ?MRN: [id number] ?Date of Birth: 04-23-1966 ?Attending MD: Norvel Richards , MD ?CSN: [id number] ?Age: 56 ?Admit Type: Outpatient ?Procedure:                Upper GI endoscopy ?Indications:              Surveillance for malignancy due to personal history  ?                          of Barrett's esophagus, Surveillance for malignancy  ?                          due to personal history of premalignant condition ?Providers:                Norvel Richards, MD, Janeece Riggers, RN, Eugene Garnet  ?                          Shanon Brow, Technician ?Referring MD:              ?Medicines:                Propofol per Anesthesia ?Complications:            No immediate complications. ?Estimated Blood Loss:     Estimated blood loss was minimal. ?Procedure:                Pre-Anesthesia Assessment: ?                          - Prior to the procedure, a History and Physical  ?                          was performed, and patient medications and  ?                          allergies were reviewed. The patient's tolerance of  ?                          previous anesthesia was also reviewed. The risks  ?                          and benefits of the procedure and the sedation  ?                          options and risks were discussed with the patient.  ?                          All questions were answered, and informed consent  ?                          was obtained. Prior Anticoagulants: The patient has  ?                          taken no previous anticoagulant or antiplatelet  ?  agents. ASA Grade Assessment: III - A patient with  ?                          severe systemic disease. After reviewing the risks  ?                          and benefits, the patient was deemed in  ?                          satisfactory condition to undergo the procedure. ?                          After obtaining informed consent, the endoscope was   ?                          passed under direct vision. Throughout the  ?                          procedure, the patient's blood pressure, pulse, and  ?                          oxygen saturations were monitored continuously. The  ?                          GIF-H190 ([id number]) scope was introduced through the  ?                          mouth, and advanced to the third part of duodenum.  ?                          The upper GI endoscopy was accomplished without  ?                          difficulty. The patient tolerated the procedure  ?                          well. ?Scope In: 11:52:12 AM ?Scope Out: 12:07:28 PM ?Total Procedure Duration: 0 hours 15 minutes 16 seconds  ?Findings: ?     Single 6 mm "island" of salmon-colored epithelium just above the EG  ?     junction. 2 sonometer tongue coming up of the above the GE junction  ?     adjacent to "tongue". No nodularity no esophagitis tubular esophagus  ?     patent throughout its course. 2 cm hiatal hernia. Gastric mucosa  ?     otherwise appeared normal. Pylorus patent. Examination first second  ?     third portion of duodenum revealed a 5 mm sessile appearing adenomatous  ?     polyp with 2 diminutive adjacent to the polyp in the bulb. The 5 Miller  ?     polyp was hot snare removed; the 2 tiny "satellite" areas were ablated  ?     with tip of hot snare loop. This maneuver was redundant with no apparent  ?     complication estimated blood loss none ?     Finally, the "island"  of salmon-colored epithelium and the "tongue" were  ?     biopsied separately. ?Impression:               - Minimal adenomatous changes in the duodenal bulb  ?                          as described above and treated as described above. ?                          -Abnormal esophagus consistent with short segment  ?                          Barrett's esophagus status post biopsy ?Moderate Sedation: ?     Moderate (conscious) sedation was personally administered by an  ?     anesthesia  professional. The following parameters were monitored: oxygen  ?     saturation, heart rate, blood pressure, respiratory rate, EKG, adequacy  ?     of pulmonary ventilation, and response to care. ?Recommendation:           - Patient has a contact number available for  ?                          emergencies. The signs and symptoms of potential  ?                          delayed complications were discussed with the  ?                          patient. Return to normal activities tomorrow.  ?                          Written discharge instructions were provided to the  ?                          patient. ?                          - Advance diet as tolerated. Continue present  ?                          medications. Follow-up with pathology. See  ?                          colonoscopy report. ?Procedure Code(s):        --- Professional --- ?                          [phone], Esophagogastroduodenoscopy, flexible,  ?                          transoral; diagnostic, including collection of  ?                          specimen(s) by brushing or washing, when performed  ?                          (  separate procedure) ?Diagnosis Code(s):        --- Professional --- ?                          K22.70, Barrett's esophagus without dysplasia ?                          Z87.19, Personal history of other diseases of the  ?                          digestive system ?CPT copyright 2019 [employer]. All rights reserved. ?The codes documented in this report are preliminary and upon coder review may  ?be revised to meet current compliance requirements. ?[name]. Farrah Skoda, MD ?Norvel Richards, MD ?04/10/2022 12:29:40 PM ?This report has been signed electronically. ?Number of Addenda: 0 ?

## 2022-04-10 NOTE — Anesthesia Postprocedure Evaluation (Signed)
Anesthesia Post Note ? ?Patient: Paul Arias ? ?Procedure(s) Performed: COLONOSCOPY WITH PROPOFOL ?ESOPHAGOGASTRODUODENOSCOPY (EGD) WITH PROPOFOL ?POLYPECTOMY ?BIOPSY ? ?Patient location during evaluation: Phase II ?Anesthesia Type: General ?Level of consciousness: awake ?Pain management: pain level controlled ?Vital Signs Assessment: post-procedure vital signs reviewed and stable ?Respiratory status: spontaneous breathing and respiratory function stable ?Cardiovascular status: blood pressure returned to baseline and stable ?Postop Assessment: no headache and no apparent nausea or vomiting ?Anesthetic complications: no ?Comments: Late entry ? ? ?No notable events documented. ? ? ?Last Vitals:  ?Vitals:  ? 04/10/22 1130 04/10/22 1230  ?BP: 138/78 (!) 101/52  ?Pulse: (!) 55 63  ?Resp: 15 (!) 22  ?Temp: 36.7 ?C 36.6 ?C  ?SpO2: 98% 93%  ?  ?Last Pain:  ?Vitals:  ? 04/10/22 1230  ?TempSrc: Axillary  ?PainSc: 0-No pain  ? ? ?  ?  ?  ?  ?  ?  ? ?Louann Sjogren ? ? ? ? ?

## 2022-04-10 NOTE — H&P (Signed)
$'@LOGO'L$ @ ? ? ?Primary Care Physician:  Celene Squibb, MD ?Primary Gastroenterologist:  Dr. Gala Romney ? ?Pre-Procedure History & Physical: ?HPI:  Paul Arias is a 56 y.o. male here for  Surveillance EGD for Barrett's and duodenal adenoma.  Also intermittent rectal bleeding history of colonic adenomas.  Here for colonoscopy as well.    No dysphagia ? ?Past Medical History:  ?Diagnosis Date  ? Alcohol dependence in remission [facility])   ? Allergic rhinitis   ? Arthritis   ? Bipolar disorder ([place])   ? CAD (coronary artery disease)   ? Cardiac catheterization May 2021 - medical therapy recommended  ? Chronic pain   ? Colon polyp   ? Tubular adenoma  ? COPD (chronic obstructive pulmonary disease) ([place])   ? Gastric polyp   ? Tubular adenoma  ? GERD (gastroesophageal reflux disease)   ? Hypertension   ? Mixed hyperlipidemia   ? ? ?Past Surgical History:  ?Procedure Laterality Date  ? BIOPSY  02/09/2020  ? Procedure: BIOPSY;  Surgeon: Daneil Dolin, MD;  [facility];  Service: Endoscopy;;  ? BIOPSY  12/17/2020  ? Procedure: BIOPSY;  Surgeon: Irving Copas., MD;  Location: [place];  Service: Gastroenterology;;  ? CATARACT EXTRACTION W/PHACO Bilateral   ? COLONOSCOPY N/A 08/19/2016  ? Dr. Aviva Signs: 2 semi-pedunculated polyps in the distal sigmoid colon removed. Measure 2-5 mm. Tubular adenomas.  ? COLONOSCOPY WITH PROPOFOL N/A 02/09/2020  ? 7 polyps (tubular adenomas and hyperplastic)with recommended 5-year repeat (2026).  ? ENDOSCOPIC MUCOSAL RESECTION N/A 05/02/2020  ? Procedure: ENDOSCOPIC MUCOSAL RESECTION;  Surgeon: Rush Landmark Telford Nab., MD;  Location: [facility];  Service: Gastroenterology;  Laterality: N/A;  ? ESOPHAGOGASTRODUODENOSCOPY N/A 08/19/2016  ? Dr. Arnoldo Morale: Villous appearing mucosa in the first portion of the duodenum, polyp removed with hot snare, pathology tubular adenoma. Medium sized hiatal hernia. Biopsy for CLOtest which was negative.  ? ESOPHAGOGASTRODUODENOSCOPY (EGD)  WITH PROPOFOL  08/20/2016  ? Dr. Gala Romney: assisted Dr. Arnoldo Morale emergently. Esophagus normal. Medium sized hiatal hernia. One nonbleeding cratered stellate-shaped gastric ulcer measuring 8 mm. One cratered ulcerated area with adherent clot in the second portion of the duodenum. 25 mm in diameter. Visible vessel seen. Status post bleeding control therapy. Heaped up margins consistent with incomplete removal.   ? ESOPHAGOGASTRODUODENOSCOPY (EGD) WITH PROPOFOL N/A 10/27/2016  ? PROPOFOL;  Surgeon: Daneil Dolin, MD; Barrett's esophagus without dysplasia or malignancy.  Normal stomach with previous gastric ulcer healed.  2 duodenal polyps revealing fragments of small bowel adenoma, otherwise normal.  Recommendations to repeat endoscopy in 1 year.  ? ESOPHAGOGASTRODUODENOSCOPY (EGD) WITH PROPOFOL N/A 02/09/2020  ? probable short segment Barrett's s/p biopsy, multiple adenomatous appearing duodenal lesions s/p biopsy.  ? ESOPHAGOGASTRODUODENOSCOPY (EGD) WITH PROPOFOL N/A 05/02/2020  ? small hiatal hernia, mucosal nodule in duodenum with complete removal s/p piecemeal mucosal resection and avulsion s/p clips.  ? ESOPHAGOGASTRODUODENOSCOPY (EGD) WITH PROPOFOL N/A 12/17/2020  ? duodenal scar from prior EMR, small are of possible recurrence s/p removal. One fragment of duodenal adenoma. Needs surveillance EGD Jan 2023.  ? FLEXIBLE SIGMOIDOSCOPY N/A 08/20/2016  ? Dr. Arnoldo Morale: Hematin found in mid sigmoid colon. One hemostatic clip placed.  ? HEMOSTASIS CLIP PLACEMENT  05/02/2020  ? Procedure: HEMOSTASIS CLIP PLACEMENT;  Surgeon: Irving Copas., MD;  Location: [facility];  Service: Gastroenterology;;  ? LEFT HEART CATH AND CORONARY ANGIOGRAPHY N/A 04/11/2020  ? Procedure: LEFT HEART CATH AND CORONARY ANGIOGRAPHY;  Surgeon: Sherren Mocha, MD;  [facility]  INVASIVE CV LAB;  Service: Cardiovascular;  Laterality: N/A;  ? POLYPECTOMY  02/09/2020  ? Procedure: POLYPECTOMY;  Surgeon: Daneil Dolin, MD;  [facility];  Service: Endoscopy;;  ? POLYPECTOMY  12/17/2020  ? Procedure: POLYPECTOMY;  Surgeon: Mansouraty, Telford Nab., MD;  Location: [place];  Service: Gastroenterology;;  ? [place] INJECTION  05/02/2020  ? Procedure: SUBMUCOSAL LIFTING INJECTION;  Surgeon: Irving Copas., MD;  Location: [facility];  Service: Gastroenterology;;  ? ? ?Prior to Admission medications   ?Medication Sig Start Date End Date Taking? Authorizing Provider  ?ALPRAZolam (XANAX) 1 MG tablet Take 2 mg by mouth at bedtime.  07/25/16  Yes [provider]  ?BIOTIN PO Take 1 tablet by mouth daily. Vit b   Yes [provider]  ?Calcium Carb-Cholecalciferol (CALCIUM 600/VITAMIN D3) 600-800 MG-UNIT TABS Take 1 tablet by mouth daily.   Yes [provider]  ?docusate sodium (COLACE) 100 MG capsule Take 200 mg by mouth at bedtime as needed for mild constipation.   Yes [provider]  ?Evolocumab (REPATHA SURECLICK) 614 MG/ML SOAJ Inject 1 Dose into the skin every 14 (fourteen) days. 03/03/22  Yes Hilty, Nadean Corwin, MD  ?fenofibrate 160 MG tablet Take 160 mg by mouth at bedtime.    Yes [provider]  ?HYDROcodone-acetaminophen (NORCO) 10-325 MG tablet Take 1 tablet by mouth every 6 (six) hours as needed (for pain). 07/11/16  Yes [provider]  ?magnesium oxide (MAG-OX) 400 MG tablet Take 400 mg by mouth daily.   Yes [provider]  ?metoprolol succinate (TOPROL-XL) 25 MG 24 hr tablet TAKE 1 TABLET BY MOUTH ONCE DAILY. 08/20/21  Yes Satira Sark, MD  ?Omega-3 Fatty Acids (OMEGA-3 FISH OIL PO) Take 1 capsule by mouth 2 (two) times daily.   Yes [provider]  ?omeprazole (PRILOSEC) 40 MG capsule TAKE (1) CAPSULE BY MOUTH TWICE DAILY BEFORE A MEAL. ?Patient taking differently: Take 40 mg by mouth daily. 10/14/21  Yes Annitta Needs, NP  ?rosuvastatin (CRESTOR) 40 MG tablet TAKE (1) TABLET BY MOUTH AT BEDTIME FOR HIGH CHOLESTEROL. ?Patient taking  differently: Take 40 mg by mouth daily. 08/20/21  Yes Satira Sark, MD  ?vitamin B-12 (CYANOCOBALAMIN) 500 MCG tablet Take 500 mcg by mouth daily.   Yes [provider]  ?nitroGLYCERIN (NITROSTAT) 0.4 MG SL tablet Place 1 tablet (0.4 mg total) under the tongue every 5 (five) minutes as needed for chest pain. ?Patient not taking: Reported on 04/04/2022 04/06/20   Barrett, Evelene Croon, PA-C  ? ? ?Allergies as of 03/12/2022  ? (No Known Allergies)  ? ? ?Family History  ?Problem Relation Age of Onset  ? Cirrhosis Mother   ? CAD Mother 1  ?     Premature CAD  ? Heart disease Father 54  ?     Premature CAD  ? Esophageal cancer Brother   ? Unexplained death Brother 94  ?     died in his sleep  ? Colon cancer Neg Hx   ? Inflammatory bowel disease Neg Hx   ? Liver disease Neg Hx   ? Pancreatic cancer Neg Hx   ? Stomach cancer Neg Hx   ? ? ?Social History  ? ?Socioeconomic History  ? Marital status: Single  ?  Spouse name: Not on file  ? Number of children: 2  ? Years of education: Not on file  ? Highest education level: Not on file  ?Occupational History  ? Not  on file  ?Tobacco Use  ? Smoking status: Former  ?  Packs/day: 0.25  ?  Years: 40.00  ?  Pack years: 10.00  ?  Types: Cigarettes  ?  Quit date: 09/08/2019  ?  Years since quitting: 2.5  ?  Passive exposure: Never  ? Smokeless tobacco: Never  ?Vaping Use  ? Vaping Use: Never used  ?Substance and Sexual Activity  ? Alcohol use: Yes  ?  Alcohol/week: 32.0 standard drinks  ?  Types: 32 Cans of beer per week  ?  Comment: daily  ? Drug use: No  ? Sexual activity: Never  ?  Birth control/protection: None  ?Other Topics Concern  ? Not on file  ?Social History Narrative  ? Not on file  ? ?Social Determinants of Health  ? ?Financial Resource Strain: Not on file  ?Food Insecurity: Not on file  ?Transportation Needs: Not on file  ?Physical Activity: Not on file  ?Stress: Not on file  ?Social Connections: Not on file  ?Intimate Partner Violence: Not on file  ? ? ?Review  of Systems: ?See HPI, otherwise negative ROS ? ?Physical Exam: ?BP 138/78   Pulse (!) 55   Temp 98 ?F (36.7 ?C) (Oral)   Resp 15   Ht '6\' 2"'$  (1.88 m)   Wt 88.5 kg   SpO2 98%   BMI 25.04 kg/m?  ?General:   Alert,  W

## 2022-04-11 LAB — SURGICAL PATHOLOGY

## 2022-04-15 ENCOUNTER — Encounter: Payer: Self-pay | Admitting: Internal Medicine

## 2022-04-17 ENCOUNTER — Encounter (HOSPITAL_COMMUNITY): Payer: Self-pay | Admitting: Internal Medicine

## 2022-04-30 ENCOUNTER — Other Ambulatory Visit: Payer: Self-pay | Admitting: Cardiology

## 2022-06-19 DIAGNOSIS — D649 Anemia, unspecified: Secondary | ICD-10-CM | POA: Insufficient documentation

## 2022-06-20 ENCOUNTER — Encounter: Payer: Self-pay | Admitting: Internal Medicine

## 2022-06-30 NOTE — Progress Notes (Unsigned)
Cardiology Office Note  Date: 07/01/2022   ID: Keyshaun, Exley 1965/12/10, MRN [id number]  PCP:  Celene Squibb, MD  Cardiologist:  Rozann Lesches, MD Electrophysiologist:  None   Chief Complaint  Patient presents with   Cardiac follow-up    History of Present Illness: Paul Arias is a 56 y.o. male last seen in February.  He is here for a routine visit, overall doing well without active angina or nitroglycerin use.  Continues to work for an [employer].  He saw Dr. Debara Pickett in the lipid clinic back in March and is now on Curry in addition to Crestor.  Most recent LDL was down to 51.  He is doing well at this point.  Follow-up Lexiscan Myoview in February of this year was low risk showing no evidence of ischemia and normal LVEF.  I reviewed his cardiac medications which are unchanged.  He is not on aspirin given history of chronic GI bleeding.  I personally reviewed his ECG today which shows sinus bradycardia.  Past Medical History:  Diagnosis Date   Alcohol dependence in remission [facility])    Allergic rhinitis    Arthritis    Bipolar disorder ([place])    CAD (coronary artery disease)    Cardiac catheterization May 2021 - medical therapy recommended   Chronic pain    Colon polyp    Tubular adenoma   COPD (chronic obstructive pulmonary disease) (HCC)    Gastric polyp    Tubular adenoma   GERD (gastroesophageal reflux disease)    Hypertension    Mixed hyperlipidemia     Past Surgical History:  Procedure Laterality Date   BIOPSY  02/09/2020   Procedure: BIOPSY;  Surgeon: Daneil Dolin, MD;  [facility];  Service: Endoscopy;;   BIOPSY  12/17/2020   Procedure: BIOPSY;  Surgeon: Irving Copas., MD;  Location: [place];  Service: Gastroenterology;;   BIOPSY  04/10/2022   Procedure: BIOPSY;  Surgeon: Daneil Dolin, MD;  [facility];  Service: Endoscopy;;   CATARACT EXTRACTION W/PHACO Bilateral    COLONOSCOPY N/A 08/19/2016    Dr. Aviva Signs: 2 semi-pedunculated polyps in the distal sigmoid colon removed. Measure 2-5 mm. Tubular adenomas.   COLONOSCOPY WITH PROPOFOL N/A 02/09/2020   7 polyps (tubular adenomas and hyperplastic)with recommended 5-year repeat (2026).   COLONOSCOPY WITH PROPOFOL N/A 04/10/2022   Procedure: COLONOSCOPY WITH PROPOFOL;  Surgeon: Daneil Dolin, MD;  [facility];  Service: Endoscopy;  Laterality: N/A;  2:00pm   ENDOSCOPIC MUCOSAL RESECTION N/A 05/02/2020   Procedure: ENDOSCOPIC MUCOSAL RESECTION;  Surgeon: Rush Landmark Telford Nab., MD;  [facility];  Service: Gastroenterology;  Laterality: N/A;   ESOPHAGOGASTRODUODENOSCOPY N/A 08/19/2016   Dr. Arnoldo Morale: Villous appearing mucosa in the first portion of the duodenum, polyp removed with hot snare, pathology tubular adenoma. Medium sized hiatal hernia. Biopsy for CLOtest which was negative.   ESOPHAGOGASTRODUODENOSCOPY (EGD) WITH PROPOFOL  08/20/2016   Dr. Gala Romney: assisted Dr. Arnoldo Morale emergently. Esophagus normal. Medium sized hiatal hernia. One nonbleeding cratered stellate-shaped gastric ulcer measuring 8 mm. One cratered ulcerated area with adherent clot in the second portion of the duodenum. 25 mm in diameter. Visible vessel seen. Status post bleeding control therapy. Heaped up margins consistent with incomplete removal.    ESOPHAGOGASTRODUODENOSCOPY (EGD) WITH PROPOFOL N/A 10/27/2016   PROPOFOL;  Surgeon: Daneil Dolin, MD; Barrett's esophagus without dysplasia or malignancy.  Normal stomach with previous gastric ulcer healed.  2 duodenal  polyps revealing fragments of small bowel adenoma, otherwise normal.  Recommendations to repeat endoscopy in 1 year.   ESOPHAGOGASTRODUODENOSCOPY (EGD) WITH PROPOFOL N/A 02/09/2020   probable short segment Barrett's s/p biopsy, multiple adenomatous appearing duodenal lesions s/p biopsy.   ESOPHAGOGASTRODUODENOSCOPY (EGD) WITH PROPOFOL N/A 05/02/2020   small hiatal hernia, mucosal nodule in  duodenum with complete removal s/p piecemeal mucosal resection and avulsion s/p clips.   ESOPHAGOGASTRODUODENOSCOPY (EGD) WITH PROPOFOL N/A 12/17/2020   duodenal scar from prior EMR, small are of possible recurrence s/p removal. One fragment of duodenal adenoma. Needs surveillance EGD Jan 2023.   ESOPHAGOGASTRODUODENOSCOPY (EGD) WITH PROPOFOL N/A 04/10/2022   Procedure: ESOPHAGOGASTRODUODENOSCOPY (EGD) WITH PROPOFOL;  Surgeon: Daneil Dolin, MD;  [facility];  Service: Endoscopy;  Laterality: N/A;   FLEXIBLE SIGMOIDOSCOPY N/A 08/20/2016   Dr. Arnoldo Morale: Hematin found in mid sigmoid colon. One hemostatic clip placed.   HEMOSTASIS CLIP PLACEMENT  05/02/2020   Procedure: HEMOSTASIS CLIP PLACEMENT;  Surgeon: Irving Copas., MD;  [facility];  Service: Gastroenterology;;   LEFT HEART CATH AND CORONARY ANGIOGRAPHY N/A 04/11/2020   Procedure: LEFT HEART CATH AND CORONARY ANGIOGRAPHY;  Surgeon: Sherren Mocha, MD;  Location: [place] [facility];  Service: Cardiovascular;  Laterality: N/A;   POLYPECTOMY  02/09/2020   Procedure: POLYPECTOMY;  Surgeon: Daneil Dolin, MD;  [facility];  Service: Endoscopy;;   POLYPECTOMY  12/17/2020   Procedure: POLYPECTOMY;  Surgeon: Irving Copas., MD;  Location: [place];  Service: Gastroenterology;;   POLYPECTOMY  04/10/2022   Procedure: POLYPECTOMY;  Surgeon: Daneil Dolin, MD;  [facility];  Service: Endoscopy;;   SUBMUCOSAL LIFTING INJECTION  05/02/2020   Procedure: SUBMUCOSAL LIFTING INJECTION;  Surgeon: Irving Copas., MD;  [facility];  Service: Gastroenterology;;    Current Outpatient Medications  Medication Sig Dispense Refill   ALPRAZolam (XANAX) 1 MG tablet Take 2 mg by mouth at bedtime.      BIOTIN PO Take 1 tablet by mouth daily. Vit b     Calcium Carb-Cholecalciferol (CALCIUM 600/VITAMIN D3) 600-800 MG-UNIT TABS Take 1 tablet by mouth daily.     Evolocumab (REPATHA  SURECLICK) 237 MG/ML SOAJ Inject 1 Dose into the skin every 14 (fourteen) days. 6 mL 3   fenofibrate 160 MG tablet Take 160 mg by mouth at bedtime.      HYDROcodone-acetaminophen (NORCO) 10-325 MG tablet Take 1 tablet by mouth every 6 (six) hours as needed (for pain).     magnesium oxide (MAG-OX) 400 MG tablet Take 400 mg by mouth daily.     metoprolol succinate (TOPROL-XL) 25 MG 24 hr tablet TAKE 1 TABLET BY MOUTH ONCE DAILY. 30 tablet 6   Omega-3 Fatty Acids (OMEGA-3 FISH OIL PO) Take 1 capsule by mouth 2 (two) times daily.     omeprazole (PRILOSEC) 40 MG capsule TAKE (1) CAPSULE BY MOUTH TWICE DAILY BEFORE A MEAL. (Patient taking differently: Take 40 mg by mouth daily.) 180 capsule 3   rosuvastatin (CRESTOR) 40 MG tablet TAKE (1) TABLET BY MOUTH AT BEDTIME FOR HIGH CHOLESTEROL. 30 tablet 6   vitamin B-12 (CYANOCOBALAMIN) 500 MCG tablet Take 500 mcg by mouth daily.     nitroGLYCERIN (NITROSTAT) 0.4 MG SL tablet Place 1 tablet (0.4 mg total) under the tongue every 5 (five) minutes as needed for chest pain. 25 tablet 3   No current facility-administered medications for this visit.   Allergies:  Patient has no known allergies.   ROS:  No palpitations or syncope.  Physical Exam: VS:  BP 130/84   Pulse (!) 55   Ht '6\' 2"'$  (1.88 m)   Wt 191 lb (86.6 kg)   SpO2 97%   BMI 24.52 kg/m , BMI Body mass index is 24.52 kg/m.  Wt Readings from Last 3 Encounters:  07/01/22 191 lb (86.6 kg)  04/10/22 195 lb (88.5 kg)  03/03/22 195 lb (88.5 kg)    General: Patient appears comfortable at rest. HEENT: Conjunctiva and lids normal, oropharynx clear. Neck: Supple, no elevated JVP or carotid bruits, no thyromegaly. Lungs: Clear to auscultation, nonlabored breathing at rest. Cardiac: Regular rate and rhythm, no S3 or significant systolic murmur, no pericardial rub. Extremities: No pitting edema.  ECG:  An ECG dated 06/27/2021 was personally reviewed today and demonstrated:  Sinus rhythm.  Recent  Labwork: 02/14/2022: Hemoglobin 13.1     Component Value Date/Time   CHOL 183 08/27/2020 1049   TRIG 253 (H) 03/04/2022 0833   HDL 44 03/04/2022 0833   CHOLHDL 5.2 08/27/2020 1049   VLDL 20 08/27/2020 1049   LDLCALC 128 (H) 08/27/2020 1049  July 2023: Cholesterol 128, triglycerides 189, HDL 46, LDL 51, BUN 16, creatinine 1.12, potassium 4.3, AST 35, ALT 34, hemoglobin 12.3, platelets 265, hemoglobin A1c 5.6%  Other Studies Reviewed Today:  Cardiac catheterization 04/11/2020: 1. 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 Moderate calcific stenosis of the proximal/mid-LAD 3. Widely patent left circumflex/OM 4. Normal LV function  Lexiscan Myoview 01/16/2022:   The study is normal. The study is low risk.   No ST deviation was noted.   LV perfusion is normal. There is no evidence of ischemia. There is no evidence of infarction.   Left ventricular function is normal. End diastolic cavity size is normal.   Motion and diaphragmatic attenuation inferior wall No ischemia or infarction EF estimated at 59%  Assessment and Plan:  1.  CAD with chronically occlusion of the RCA associated with left-to-right collaterals and otherwise moderate proximal to mid LAD stenosis being managed medically.  Plan to refill new bottle of nitroglycerin for as needed use.  He is not on aspirin given history of GI bleeding.  Continue Toprol-XL, Crestor, and Repatha.  Follow-up Lexiscan Myoview in February of this year was low risk.  2.  Mixed hyperlipidemia, doing well on Crestor and Repatha with recent LDL down to 51.  Medication Adjustments/Labs and Tests Ordered: Current medicines are reviewed at length with the patient today.  Concerns regarding medicines are outlined above.   Tests Ordered: Orders Placed This Encounter  Procedures   EKG 12-Lead    Medication Changes: Meds ordered this encounter  Medications   nitroGLYCERIN  (NITROSTAT) 0.4 MG SL tablet    Sig: Place 1 tablet (0.4 mg total) under the tongue every 5 (five) minutes as needed for chest pain.    Dispense:  25 tablet    Refill:  3    Disposition:  Follow up  6 months.  Signed, Satira Sark, MD, [facility] 07/01/2022 8:54 AM    [facility] at [facility] [address] [place],  [address] Phone: [phone]; Fax: [phone]

## 2022-07-01 ENCOUNTER — Encounter: Payer: Self-pay | Admitting: Cardiology

## 2022-07-01 ENCOUNTER — Ambulatory Visit (INDEPENDENT_AMBULATORY_CARE_PROVIDER_SITE_OTHER): Payer: 59 | Admitting: Cardiology

## 2022-07-01 ENCOUNTER — Ambulatory Visit: Payer: 59 | Admitting: Internal Medicine

## 2022-07-01 VITALS — BP 130/84 | HR 55 | Ht 74.0 in | Wt 191.0 lb

## 2022-07-01 DIAGNOSIS — I25119 Atherosclerotic heart disease of native coronary artery with unspecified angina pectoris: Secondary | ICD-10-CM | POA: Diagnosis not present

## 2022-07-01 DIAGNOSIS — E782 Mixed hyperlipidemia: Secondary | ICD-10-CM

## 2022-07-01 MED ORDER — NITROGLYCERIN 0.4 MG SL SUBL: 0.4000 mg | SUBLINGUAL_TABLET | SUBLINGUAL | 3 refills | Status: DC | PRN

## 2022-07-01 NOTE — Patient Instructions (Signed)
Medication Instructions:  Your physician recommends that you continue on your current medications as directed. Please refer to the Current Medication list given to you today.   Labwork: None today  Testing/Procedures: None today  Follow-Up: 6 months  Any Other Special Instructions Will Be Listed Below (If Applicable).  If you need a refill on your cardiac medications before your next appointment, please call your pharmacy.  

## 2022-07-11 ENCOUNTER — Ambulatory Visit (INDEPENDENT_AMBULATORY_CARE_PROVIDER_SITE_OTHER): Payer: 59 | Admitting: Gastroenterology

## 2022-07-11 ENCOUNTER — Encounter: Payer: Self-pay | Admitting: *Deleted

## 2022-07-11 ENCOUNTER — Encounter: Payer: Self-pay | Admitting: Gastroenterology

## 2022-07-11 VITALS — BP 137/85 | HR 55 | Temp 97.5°F | Ht 74.0 in | Wt 194.2 lb

## 2022-07-11 DIAGNOSIS — F1010 Alcohol abuse, uncomplicated: Secondary | ICD-10-CM

## 2022-07-11 DIAGNOSIS — K2270 Barrett's esophagus without dysplasia: Secondary | ICD-10-CM | POA: Diagnosis not present

## 2022-07-11 DIAGNOSIS — Z86010 Personal history of colonic polyps: Secondary | ICD-10-CM

## 2022-07-11 DIAGNOSIS — K2100 Gastro-esophageal reflux disease with esophagitis, without bleeding: Secondary | ICD-10-CM | POA: Diagnosis not present

## 2022-07-11 DIAGNOSIS — D132 Benign neoplasm of duodenum: Secondary | ICD-10-CM | POA: Diagnosis not present

## 2022-07-11 MED ORDER — OMEPRAZOLE 40 MG PO CPDR: 40.0000 mg | DELAYED_RELEASE_CAPSULE | Freq: Every day | ORAL | 3 refills | Status: DC

## 2022-07-11 NOTE — Patient Instructions (Signed)
Continue omeprazole '40mg'$  daily before breakfast.  Cut back on daily alcohol use, with initial goal of not drinking every day. Abdominal ultrasound to be scheduled.  Return office visit in one year or sooner if needed.

## 2022-07-11 NOTE — Progress Notes (Unsigned)
GI Office Note    Referring Provider: Celene Squibb, MD Primary Care Physician:  Celene Squibb, MD  Primary Gastroenterologist: Garfield Cornea, MD   Chief Complaint   Chief Complaint  Patient presents with   Follow-up    Doing well    History of Present Illness   Paul Arias is a 56 y.o. male presenting today for follow up. 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 He has undergone multiple procedures for surveillance and eventual EMR by Dr. Rush Landmark in 04/2020. Also with history of multiple colonic adenomas.  Since last office visit he completed EGD and colonoscopy in 04/2022. Findings as outlined below. He reports feeling well. Takes omeprazole daily. No dysphagia, vomiting, abdominal pain. BM regular. No recent rectal bleeding. He consumes etoh daily, at least a 6 pack of beer. He has done this for years. No prior liver imaging.   Colonoscopy 04/2022: -Non-bleeding internal hemorrhoids. - One 8 mm polyp in the splenic flexure, removed with a cold snare. Resected and retrieved. - The examination was otherwise normal on direct and retroflexion views.  EGD 04/2022: -Minimal adenomatous changes in the duodenal bulb as described above and treated as described above. -Abnormal esophagus consistent with short segment Barrett's esophagus status post biopsy  A. DUODENUM, POLYPECTOMY:  Minute polypoid fragments of duodenal mucosa mixed with large amount of  neutrophils and acellular debris.  Negative for adenomatous change, dysplasia and malignancy.   B. ESOPHAGUS, BIOPSY:  Nonspecific inflammation in that fragment of gastric mucosa without  goblet cell metaplasia.  Adjacent part of normal esophageal mucosa.  There are no diagnostic features of Barrett's esophagus and eosinophilic  esophagitis.  Negative for dysplasia and malignancy.   C. COLON, SPLENIC FLEXURE, POLYPECTOMY:  Tubular adenoma.  Negative for  high-grade dysplasia   Medications   Current Outpatient Medications  Medication Sig Dispense Refill   ALPRAZolam (XANAX) 1 MG tablet Take 2 mg by mouth at bedtime.      Evolocumab (REPATHA SURECLICK) 865 MG/ML SOAJ Inject 1 Dose into the skin every 14 (fourteen) days. 6 mL 3   fenofibrate 160 MG tablet Take 160 mg by mouth at bedtime.      HYDROcodone-acetaminophen (NORCO) 10-325 MG tablet Take 1 tablet by mouth every 6 (six) hours as needed (for pain).     magnesium oxide (MAG-OX) 400 MG tablet Take 400 mg by mouth daily.     metoprolol succinate (TOPROL-XL) 25 MG 24 hr tablet TAKE 1 TABLET BY MOUTH ONCE DAILY. 30 tablet 6   nitroGLYCERIN (NITROSTAT) 0.4 MG SL tablet Place 1 tablet (0.4 mg total) under the tongue every 5 (five) minutes as needed for chest pain. 25 tablet 3   Omega-3 Fatty Acids (OMEGA-3 FISH OIL PO) Take 1 capsule by mouth 2 (two) times daily.     omeprazole (PRILOSEC) 40 MG capsule TAKE (1) CAPSULE BY MOUTH TWICE DAILY BEFORE A MEAL. (Patient taking differently: Take 40 mg by mouth daily.) 180 capsule 3   rosuvastatin (CRESTOR) 40 MG tablet TAKE (1) TABLET BY MOUTH AT BEDTIME FOR HIGH CHOLESTEROL. 30 tablet 6   vitamin B-12 (CYANOCOBALAMIN) 500 MCG tablet Take 500 mcg by mouth daily.     No current facility-administered medications for this visit.    Allergies   Allergies as of 07/11/2022   (No Known Allergies)     Review of Systems   General: Negative for anorexia, weight loss, fever, chills, fatigue, weakness.  ENT: Negative for hoarseness, difficulty swallowing , nasal congestion. CV: Negative for chest pain, angina, palpitations, dyspnea on exertion, peripheral edema.  Respiratory: Negative for dyspnea at rest, dyspnea on exertion, cough, sputum, wheezing.  GI: See history of present illness. GU:  Negative for dysuria, hematuria, urinary incontinence, urinary frequency, nocturnal urination.  Endo: Negative for unusual weight change.     Physical Exam   BP  137/85 (BP Location: Left Arm, Patient Position: Sitting, Cuff Size: Normal)   Pulse (!) 55   Temp (!) 97.5 F (36.4 C) (Temporal)   Ht '6\' 2"'$  (1.88 m)   Wt 194 lb 3.2 oz (88.1 kg)   SpO2 97%   BMI 24.93 kg/m    General: Well-nourished, well-developed in no acute distress.  Eyes: No icterus. Mouth: Oropharyngeal mucosa moist and pink , no lesions erythema or exudate. Lungs: Clear to auscultation bilaterally.  Heart: Regular rate and rhythm, no murmurs rubs or gallops.  Abdomen: Bowel sounds are normal, nontender, nondistended, no hepatosplenomegaly or masses,  no abdominal bruits or hernia , no rebound or guarding.  Rectal: not performed Extremities: No lower extremity edema. No clubbing or deformities. Neuro: Alert and oriented x 4   Skin: Warm and dry, no jaundice.   Psych: Alert and cooperative, normal mood and affect.  Labs   06/2022: WBC 4100, Hgb 12.3L, Hct 38.4, Platelets 265000, BUN 16, Cre 1.12, Alb 4.4, Tbili 0.2, AP 64, AST 35, ALT 34, A1C 5.6  Imaging Studies   No results found.  Assessment   Barrett's esophagus/GERD: Last EGD as outlined with no evidence of Barrett's on biopsy, initially noted on path in 2017.  Reflux well controlled. Surveillance EGD in 5 years.   Duodenal adenoma: EGD findings as outlined. No adenomatous changed on path 04/2022. Surveillance EGD in 04/2027.   H/o adenomatous colon polyps: surveillance due in 04/2027.  Etoh dependence: chronic daily etoh abuse. Discussed potential ramifications of ongoing use. Offered baseline ultrasound. Would continue to monitor LFTs with caveat that normal LFTS does not necessarily mean there is no underlying liver damage. Often LFTs are not abnormal until there is more advanced/progressive disease. Stress importance of cutting back on etoh use.   PLAN   Abd u/s. Continue omeprazole '40mg'$  daily before breakfast.  Recommend cutting back on etoh, initial goal of fewer drinks daily, not drinking every day. Return  to the office in one year or sooner if needed.    Laureen Ochs. Bobby Rumpf, [place], [facility]

## 2022-07-12 ENCOUNTER — Encounter: Payer: Self-pay | Admitting: Gastroenterology

## 2022-07-21 ENCOUNTER — Ambulatory Visit (HOSPITAL_COMMUNITY)
Admission: RE | Admit: 2022-07-21 | Discharge: 2022-07-21 | Disposition: A | Discharge status: Home / Self Care | Payer: 59 | Source: Ambulatory Visit | Attending: Gastroenterology | Admitting: Gastroenterology

## 2022-07-21 DIAGNOSIS — F1010 Alcohol abuse, uncomplicated: Secondary | ICD-10-CM | POA: Insufficient documentation

## 2022-12-03 ENCOUNTER — Other Ambulatory Visit: Payer: Self-pay | Admitting: Cardiology

## 2022-12-18 DIAGNOSIS — N400 Enlarged prostate without lower urinary tract symptoms: Secondary | ICD-10-CM | POA: Insufficient documentation

## 2022-12-19 ENCOUNTER — Encounter: Payer: Self-pay | Admitting: Internal Medicine

## 2023-01-05 ENCOUNTER — Ambulatory Visit: Payer: 59 | Attending: Cardiology | Admitting: Cardiology

## 2023-01-05 ENCOUNTER — Ambulatory Visit: Payer: 59 | Admitting: Cardiology

## 2023-01-05 ENCOUNTER — Encounter: Payer: Self-pay | Admitting: Cardiology

## 2023-01-05 VITALS — BP 120/78 | HR 80 | Ht 74.0 in | Wt 203.2 lb

## 2023-01-05 DIAGNOSIS — I25119 Atherosclerotic heart disease of native coronary artery with unspecified angina pectoris: Secondary | ICD-10-CM

## 2023-01-05 DIAGNOSIS — E782 Mixed hyperlipidemia: Secondary | ICD-10-CM | POA: Diagnosis not present

## 2023-01-05 NOTE — Patient Instructions (Signed)
Medication Instructions:  Your physician recommends that you continue on your current medications as directed. Please refer to the Current Medication list given to you today.   Labwork: None today  Testing/Procedures: None today  Follow-Up: 1 year  Any Other Special Instructions Will Be Listed Below (If Applicable).  If you need a refill on your cardiac medications before your next appointment, please call your pharmacy.  

## 2023-01-05 NOTE — Progress Notes (Signed)
Cardiology Office Note  Date: 01/05/2023   ID: [name], [name] August 19, 1966, MRN [id number]  PCP:  Celene Squibb, MD  Cardiologist:  Rozann Lesches, MD Electrophysiologist:  None   Chief Complaint  Patient presents with   Cardiac follow-up    History of Present Illness: Paul Arias is a 57 y.o. male last seen in July 2023.  He is here for a routine visit.  Continues to work full-time as before.  Reports stable dyspnea on exertion, no angina, no palpitations or syncope.  I reviewed his medications which are stable from a cardiac perspective.  He does admit that he dropped off regular use of Repatha since his lipids were checked in July of last year with LDL 51.  Recent LDL up to 107.  He is now focused on pain consistent with Repatha.  He did have a recent visit with Dr. [place] Crane.  Otherwise no major health changes.  Past Medical History:  Diagnosis Date   Alcohol dependence in remission [facility])    Allergic rhinitis    Arthritis    Bipolar disorder ([place])    CAD (coronary artery disease)    Cardiac catheterization May 2021 - medical therapy recommended   Chronic pain    Colon polyp    Tubular adenoma   COPD (chronic obstructive pulmonary disease) (HCC)    Gastric polyp    Tubular adenoma   GERD (gastroesophageal reflux disease)    Hypertension    Mixed hyperlipidemia     Current Outpatient Medications  Medication Sig Dispense Refill   ALPRAZolam (XANAX) 1 MG tablet Take 2 mg by mouth at bedtime.      Evolocumab (REPATHA SURECLICK) 932 MG/ML SOAJ Inject 1 Dose into the skin every 14 (fourteen) days. 6 mL 3   fenofibrate 160 MG tablet Take 160 mg by mouth at bedtime.      HYDROcodone-acetaminophen (NORCO) 10-325 MG tablet Take 1 tablet by mouth every 6 (six) hours as needed (for pain).     magnesium oxide (MAG-OX) 400 MG tablet Take 400 mg by mouth daily.     metoprolol succinate (TOPROL-XL) 25 MG 24 hr tablet TAKE 1 TABLET BY MOUTH ONCE DAILY. 30 tablet 1    nitroGLYCERIN (NITROSTAT) 0.4 MG SL tablet Place 1 tablet (0.4 mg total) under the tongue every 5 (five) minutes as needed for chest pain. 25 tablet 3   Omega-3 Fatty Acids (OMEGA-3 FISH OIL PO) Take 1 capsule by mouth 2 (two) times daily.     omeprazole (PRILOSEC) 40 MG capsule Take 1 capsule (40 mg total) by mouth daily before breakfast. 90 capsule 3   rosuvastatin (CRESTOR) 40 MG tablet TAKE (1) TABLET BY MOUTH AT BEDTIME FOR HIGH CHOLESTEROL. 30 tablet 1   traZODone (DESYREL) 50 MG tablet Take by mouth.     vitamin B-12 (CYANOCOBALAMIN) 500 MCG tablet Take 500 mcg by mouth daily.     No current facility-administered medications for this visit.   Allergies:  Patient has no known allergies.   ROS: No palpitations or syncope.  Physical Exam: VS:  BP 120/78   Pulse 80   Ht '6\' 2"'$  (1.88 m)   Wt 203 lb 3.2 oz (92.2 kg)   SpO2 97%   BMI 26.09 kg/m , BMI Body mass index is 26.09 kg/m.  Wt Readings from Last 3 Encounters:  01/05/23 203 lb 3.2 oz (92.2 kg)  07/11/22 194 lb 3.2 oz (88.1 kg)  07/01/22 191 lb (86.6 kg)    General:  Patient appears comfortable at rest. HEENT: Conjunctiva and lids normal. Neck: Supple, no elevated JVP or carotid bruits. Lungs: Clear to auscultation, nonlabored breathing at rest. Cardiac: Regular rate and rhythm, no S3 or significant systolic murmur.  ECG:  An ECG dated 07/01/2022 was personally reviewed today and demonstrated:  Sinus bradycardia.  Recent Labwork:  January 2024: Hemoglobin 13.5, platelets 278, BUN 18, creatinine 1.08, potassium 4.2, AST 26, ALT 23, cholesterol 195, triglycerides 202, HDL 53, LDL 107 (51 in July 2023)  Other Studies Reviewed Today:  Lexiscan Myoview 01/16/2022:   The study is normal. The study is low risk.   No ST deviation was noted.   LV perfusion is normal. There is no evidence of ischemia. There is no evidence of infarction.   Left ventricular function is normal. End diastolic cavity size is normal.   Motion and  diaphragmatic attenuation inferior wall No ischemia or infarction EF estimated at 59%  Assessment and Plan:  1.  CAD with chronically occluded RCA associated with left-to-right collaterals and moderate proximal to mid LAD stenosis managed medically.  Follow-up ischemic testing last year was low risk.  Plan to continue observation on Repatha, Toprol-XL, Crestor, and as needed nitroglycerin.  2.  Mixed hyperlipidemia, recent LDL up to 107 from 51 previously in the absence of regular Repatha use.  Compliance reinforced and he indicates being focused on this now.  Continue Crestor otherwise.  Medication Adjustments/Labs and Tests Ordered: Current medicines are reviewed at length with the patient today.  Concerns regarding medicines are outlined above.   Tests Ordered: No orders of the defined types were placed in this encounter.   Medication Changes: No orders of the defined types were placed in this encounter.   Disposition:  Follow up  1 year.  Signed, Satira Sark, MD, [facility] 01/05/2023 4:42 PM    [facility] at [facility] [address] [place],  [address] Phone: [phone]; Fax: [phone]

## 2023-01-13 ENCOUNTER — Other Ambulatory Visit: Payer: Self-pay | Admitting: Cardiology

## 2023-01-16 ENCOUNTER — Encounter: Payer: Self-pay | Admitting: Internal Medicine

## 2023-02-10 NOTE — Progress Notes (Unsigned)
GI Office Note    Referring Provider: Celene Squibb, MD Primary Care Physician:  Celene Squibb, MD  Primary Gastroenterologist: Garfield Cornea, MD   Chief Complaint   No chief complaint on file.   History of Present Illness   Paul Arias is a 57 y.o. male presenting today for follow up. Last seen in 07/2022. 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 He has undergone multiple procedures for surveillance and eventual EMR by Dr. Rush Landmark in 04/2020. Also with history of multiple colonic adenomas. H/o etoh dependence.   Labs 12/2022 *** Colonoscopy 04/2022: -Non-bleeding internal hemorrhoids. - One 8 mm polyp in the splenic flexure, removed with a cold snare. Resected and retrieved. - The examination was otherwise normal on direct and retroflexion views.   EGD 04/2022: -Minimal adenomatous changes in the duodenal bulb as described above and treated as described above. -Abnormal esophagus consistent with short segment Barrett's esophagus status post biopsy  A. DUODENUM, POLYPECTOMY:  Minute polypoid fragments of duodenal mucosa mixed with large amount of  neutrophils and acellular debris.  Negative for adenomatous change, dysplasia and malignancy.   B. ESOPHAGUS, BIOPSY:  Nonspecific inflammation in that fragment of gastric mucosa without  goblet cell metaplasia.  Adjacent part of normal esophageal mucosa.  There are no diagnostic features of Barrett's esophagus and eosinophilic  esophagitis.  Negative for dysplasia and malignancy.   C. COLON, SPLENIC FLEXURE, POLYPECTOMY:  Tubular adenoma.  Negative for high-grade dysplasia       Medications   Current Outpatient Medications  Medication Sig Dispense Refill   ALPRAZolam (XANAX) 1 MG tablet Take 2 mg by mouth at bedtime.      Evolocumab (REPATHA SURECLICK) [id number] MG/ML SOAJ Inject 1 Dose into the skin every 14 (fourteen) days. 6 mL 3   fenofibrate 160 MG  tablet Take 160 mg by mouth at bedtime.      HYDROcodone-acetaminophen (NORCO) 10-325 MG tablet Take 1 tablet by mouth every 6 (six) hours as needed (for pain).     magnesium oxide (MAG-OX) 400 MG tablet Take 400 mg by mouth daily.     metoprolol succinate (TOPROL-XL) 25 MG 24 hr tablet TAKE 1 TABLET BY MOUTH ONCE DAILY. 90 tablet 3   nitroGLYCERIN (NITROSTAT) 0.4 MG SL tablet Place 1 tablet (0.4 mg total) under the tongue every 5 (five) minutes as needed for chest pain. 25 tablet 3   Omega-3 Fatty Acids (OMEGA-3 FISH OIL PO) Take 1 capsule by mouth 2 (two) times daily.     omeprazole (PRILOSEC) 40 MG capsule Take 1 capsule (40 mg total) by mouth daily before breakfast. 90 capsule 3   rosuvastatin (CRESTOR) 40 MG tablet TAKE (1) TABLET BY MOUTH AT BEDTIME FOR HIGH CHOLESTEROL. 90 tablet 3   traZODone (DESYREL) 50 MG tablet Take by mouth.     vitamin B-12 (CYANOCOBALAMIN) 500 MCG tablet Take 500 mcg by mouth daily.     No current facility-administered medications for this visit.    Allergies   Allergies as of 02/11/2023   (No Known Allergies)     Past Medical History   Past Medical History:  Diagnosis Date   Alcohol dependence in remission [facility])    Allergic rhinitis    Arthritis    Bipolar disorder ([place])    CAD (coronary artery disease)    Cardiac catheterization May 2021 - medical therapy recommended   Chronic pain    Colon polyp  Tubular adenoma   COPD (chronic obstructive pulmonary disease) (HCC)    Gastric polyp    Tubular adenoma   GERD (gastroesophageal reflux disease)    Hypertension    Mixed hyperlipidemia     Past Surgical History   Past Surgical History:  Procedure Laterality Date   BIOPSY  02/09/2020   Procedure: BIOPSY;  Surgeon: Daneil Dolin, MD;  [facility];  Service: Endoscopy;;   BIOPSY  12/17/2020   Procedure: BIOPSY;  Surgeon: Irving Copas., MD;  Location: [facility] ENDOSCOPY;  Service: Gastroenterology;;   BIOPSY  04/10/2022    Procedure: BIOPSY;  Surgeon: Daneil Dolin, MD;  [facility];  Service: Endoscopy;;   CATARACT EXTRACTION W/PHACO Bilateral    COLONOSCOPY N/A 08/19/2016   Dr. Aviva Signs: 2 semi-pedunculated polyps in the distal sigmoid colon removed. Measure 2-5 mm. Tubular adenomas.   COLONOSCOPY WITH PROPOFOL N/A 02/09/2020   7 polyps (tubular adenomas and hyperplastic)with recommended 5-year repeat (2026).   COLONOSCOPY WITH PROPOFOL N/A 04/10/2022   Procedure: COLONOSCOPY WITH PROPOFOL;  Surgeon: Daneil Dolin, MD;  [facility];  Service: Endoscopy;  Laterality: N/A;  2:00pm   ENDOSCOPIC MUCOSAL RESECTION N/A 05/02/2020   Procedure: ENDOSCOPIC MUCOSAL RESECTION;  Surgeon: Rush Landmark Telford Nab., MD;  Location: [facility];  Service: Gastroenterology;  Laterality: N/A;   ESOPHAGOGASTRODUODENOSCOPY N/A 08/19/2016   Dr. Arnoldo Morale: Villous appearing mucosa in the first portion of the duodenum, polyp removed with hot snare, pathology tubular adenoma. Medium sized hiatal hernia. Biopsy for CLOtest which was negative.   ESOPHAGOGASTRODUODENOSCOPY (EGD) WITH PROPOFOL  08/20/2016   Dr. Gala Romney: assisted Dr. Arnoldo Morale emergently. Esophagus normal. Medium sized hiatal hernia. One nonbleeding cratered stellate-shaped gastric ulcer measuring 8 mm. One cratered ulcerated area with adherent clot in the second portion of the duodenum. 25 mm in diameter. Visible vessel seen. Status post bleeding control therapy. Heaped up margins consistent with incomplete removal.    ESOPHAGOGASTRODUODENOSCOPY (EGD) WITH PROPOFOL N/A 10/27/2016   PROPOFOL;  Surgeon: Daneil Dolin, MD; Barrett's esophagus without dysplasia or malignancy.  Normal stomach with previous gastric ulcer healed.  2 duodenal polyps revealing fragments of small bowel adenoma, otherwise normal.  Recommendations to repeat endoscopy in 1 year.   ESOPHAGOGASTRODUODENOSCOPY (EGD) WITH PROPOFOL N/A 02/09/2020   probable short segment Barrett's s/p  biopsy, multiple adenomatous appearing duodenal lesions s/p biopsy.   ESOPHAGOGASTRODUODENOSCOPY (EGD) WITH PROPOFOL N/A 05/02/2020   small hiatal hernia, mucosal nodule in duodenum with complete removal s/p piecemeal mucosal resection and avulsion s/p clips.   ESOPHAGOGASTRODUODENOSCOPY (EGD) WITH PROPOFOL N/A 12/17/2020   duodenal scar from prior EMR, small are of possible recurrence s/p removal. One fragment of duodenal adenoma. Needs surveillance EGD Jan 2023.   ESOPHAGOGASTRODUODENOSCOPY (EGD) WITH PROPOFOL N/A 04/10/2022   Procedure: ESOPHAGOGASTRODUODENOSCOPY (EGD) WITH PROPOFOL;  Surgeon: Daneil Dolin, MD;  [facility];  Service: Endoscopy;  Laterality: N/A;   FLEXIBLE SIGMOIDOSCOPY N/A 08/20/2016   Dr. Arnoldo Morale: Hematin found in mid sigmoid colon. One hemostatic clip placed.   HEMOSTASIS CLIP PLACEMENT  05/02/2020   Procedure: HEMOSTASIS CLIP PLACEMENT;  Surgeon: Irving Copas., MD;  Location: [facility];  Service: Gastroenterology;;   LEFT HEART CATH AND CORONARY ANGIOGRAPHY N/A 04/11/2020   Procedure: LEFT HEART CATH AND CORONARY ANGIOGRAPHY;  Surgeon: Sherren Mocha, MD;  Location: [place] CV LAB;  Service: Cardiovascular;  Laterality: N/A;   POLYPECTOMY  02/09/2020   Procedure: POLYPECTOMY;  Surgeon: Daneil Dolin, MD;  [facility];  Service: Endoscopy;;   POLYPECTOMY  12/17/2020   Procedure: POLYPECTOMY;  Surgeon: Mansouraty, Telford Nab., MD;  Location: [place];  Service: Gastroenterology;;   POLYPECTOMY  04/10/2022   Procedure: POLYPECTOMY;  Surgeon: Daneil Dolin, MD;  [facility];  Service: Endoscopy;;   SUBMUCOSAL LIFTING INJECTION  05/02/2020   Procedure: SUBMUCOSAL LIFTING INJECTION;  Surgeon: Irving Copas., MD;  Location: [facility];  Service: Gastroenterology;;    Past Family History   Family History  Problem Relation Age of Onset   Cirrhosis Mother    CAD Mother 28       Premature CAD   Heart  disease Father 59       Premature CAD   Esophageal cancer Brother    Unexplained death Brother 78       died in his sleep   Colon cancer Neg Hx    Inflammatory bowel disease Neg Hx    Liver disease Neg Hx    Pancreatic cancer Neg Hx    Stomach cancer Neg Hx     Past Social History   Social History   Socioeconomic History   Marital status: Single    Spouse name: Not on file   Number of children: 2   Years of education: Not on file   Highest education level: Not on file  Occupational History   Not on file  Tobacco Use   Smoking status: Former    Packs/day: 0.25    Years: 40.00    Total pack years: 10.00    Types: Cigarettes    Quit date: 09/08/2019    Years since quitting: 3.4    Passive exposure: Never   Smokeless tobacco: Never  Vaping Use   Vaping Use: Never used  Substance and Sexual Activity   Alcohol use: Yes    Alcohol/week: 32.0 standard drinks of alcohol    Types: 32 Cans of beer per week    Comment: daily   Drug use: No   Sexual activity: Not Currently    Birth control/protection: None  Other Topics Concern   Not on file  Social History Narrative   Not on file   Social Determinants of Health   Financial Resource Strain: Not on file  Food Insecurity: Not on file  Transportation Needs: Not on file  Physical Activity: Not on file  Stress: Not on file  Social Connections: Not on file  Intimate Partner Violence: Not on file    Review of Systems   General: Negative for anorexia, weight loss, fever, chills, fatigue, weakness. ENT: Negative for hoarseness, difficulty swallowing , nasal congestion. CV: Negative for chest pain, angina, palpitations, dyspnea on exertion, peripheral edema.  Respiratory: Negative for dyspnea at rest, dyspnea on exertion, cough, sputum, wheezing.  GI: See history of present illness. GU:  Negative for dysuria, hematuria, urinary incontinence, urinary frequency, nocturnal urination.  Endo: Negative for unusual weight change.      Physical Exam   There were no vitals taken for this visit.   General: Well-nourished, well-developed in no acute distress.  Eyes: No icterus. Mouth: Oropharyngeal mucosa moist and pink , no lesions erythema or exudate. Lungs: Clear to auscultation bilaterally.  Heart: Regular rate and rhythm, no murmurs rubs or gallops.  Abdomen: Bowel sounds are normal, nontender, nondistended, no hepatosplenomegaly or masses,  no abdominal bruits or hernia , no rebound or guarding.  Rectal: ***  Extremities: No lower extremity edema. No clubbing or deformities. Neuro: Alert and oriented x 4  Skin: Warm and dry, no jaundice.   Psych: Alert and cooperative, normal mood and affect.  Labs   *** Imaging Studies   No results found.  Assessment       PLAN   ***   Laureen Ochs. Bobby Rumpf, [place], [facility]

## 2023-02-11 ENCOUNTER — Ambulatory Visit (INDEPENDENT_AMBULATORY_CARE_PROVIDER_SITE_OTHER): Payer: 59 | Admitting: Gastroenterology

## 2023-02-11 ENCOUNTER — Telehealth: Payer: Self-pay | Admitting: Gastroenterology

## 2023-02-11 ENCOUNTER — Encounter: Payer: Self-pay | Admitting: Gastroenterology

## 2023-02-11 VITALS — BP 127/76 | HR 62 | Temp 98.2°F | Ht 74.0 in | Wt 199.2 lb

## 2023-02-11 DIAGNOSIS — K2270 Barrett's esophagus without dysplasia: Secondary | ICD-10-CM

## 2023-02-11 DIAGNOSIS — R1030 Lower abdominal pain, unspecified: Secondary | ICD-10-CM | POA: Diagnosis not present

## 2023-02-11 DIAGNOSIS — D132 Benign neoplasm of duodenum: Secondary | ICD-10-CM | POA: Diagnosis not present

## 2023-02-11 DIAGNOSIS — K2100 Gastro-esophageal reflux disease with esophagitis, without bleeding: Secondary | ICD-10-CM | POA: Diagnosis not present

## 2023-02-11 NOTE — Telephone Encounter (Signed)
Please NIC for ov in one year.

## 2023-02-11 NOTE — Patient Instructions (Signed)
Continue to monitor for irregular bowel movements or recurrent abdominal pain. If recurrent symptoms, please call Tammy, CMA and let her know. Her number is [phone]. We would consider work up at that time.

## 2023-02-12 ENCOUNTER — Telehealth: Payer: Self-pay | Admitting: Internal Medicine

## 2023-02-12 NOTE — Telephone Encounter (Signed)
PA for Repatha submitted via CMM (Key: [id number])

## 2023-02-12 NOTE — Telephone Encounter (Signed)
Request Reference Number: [id number]. REPATHA SURE INJ '140MG'$ /ML is approved through 02/12/2024. Your patient may now fill this prescription and it will be covered.. Authorization Expiration Date: February 12, 2024.

## 2023-05-08 ENCOUNTER — Other Ambulatory Visit: Payer: Self-pay | Admitting: Internal Medicine

## 2023-06-04 ENCOUNTER — Telehealth: Payer: Self-pay | Admitting: Cardiology

## 2023-06-04 NOTE — Telephone Encounter (Signed)
Pt c/o swelling: STAT is pt has developed SOB within 24 hours  If swelling, where is the swelling located? LE  How much weight have you gained and in what time span? N/A  Have you gained 3 pounds in a day or 5 pounds in a week? N/A  Do you have a log of your daily weights (if so, list)? No   Are you currently taking a fluid pill? No   Are you currently SOB? Always had SOB  Have you traveled recently? No

## 2023-06-04 NOTE — Telephone Encounter (Signed)
Spoke to patient who stated he had noticed his lower legs, ankles, and feet were swollen yesterday. Pt stated that he has never had this problem before. Pt denied CP, and more than usual SOB (hx of COPD/Emphysema). Pt stated that his current weight is 204 lb (203 at last ov). Pt stated that he has recently started salting food, but does not use in excess- advised to limit salt intake. 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   Please advise.

## 2023-06-05 NOTE — Telephone Encounter (Signed)
Dr.McDowell's message relayed to patient. He will update [place] at a later date.

## 2023-06-05 NOTE — Telephone Encounter (Signed)
Pt calling back for an update  

## 2023-07-03 ENCOUNTER — Encounter: Payer: Self-pay | Admitting: Internal Medicine

## 2023-07-07 ENCOUNTER — Encounter: Payer: Self-pay | Admitting: Internal Medicine

## 2023-08-11 ENCOUNTER — Other Ambulatory Visit: Payer: Self-pay | Admitting: Gastroenterology

## 2023-08-27 ENCOUNTER — Telehealth: Payer: Self-pay | Admitting: Cardiology

## 2023-08-27 MED ORDER — FUROSEMIDE 20 MG PO TABS: ORAL_TABLET | ORAL | 0 refills | Status: DC

## 2023-08-27 NOTE — Telephone Encounter (Signed)
Paul Sidle, MD  to Paul Arias, [facility]     06/04/23  5:12 PM First step would be limiting salt and elevating legs. If that doesn't help, would report back.        I spoke with patient today. He reports swelling in feet for the past week. His weight is essentially the same. He also c/o more SOB than his usual COPD symptoms. He denies CP.  He is unable to elevate legs during the day and he eats pizza and [employer] for lunch. We discussed sodium limits of 2 Gms per day and that fast food is not the best choice to eat.

## 2023-08-27 NOTE — Telephone Encounter (Signed)
I spoke with patient and he says he spoke with his sister, and he now wants testing on his legs because he is a heart patient.

## 2023-08-27 NOTE — Telephone Encounter (Signed)
Patient will go to the [place] office to see Dr.McDowell on 9/26 ,arrive at 9:25 am for check in the [facility] office with Dr.McDowell.

## 2023-08-27 NOTE — Telephone Encounter (Signed)
Pt c/o swelling/edema: STAT if pt has developed SOB within 24 hours  If swelling, where is the swelling located?   Both ankles  How much weight have you gained and in what time span?   No  Have you gained 2 pounds in a day or 5 pounds in a week?   No  Do you have a log of your daily weights (if so, list)?   No.  Patient states his weight ranges between 197-202 lbs  Are you currently taking a fluid pill?   No  Are you currently SOB?  Yes  Have you traveled recently in a car or plane for an extended period of time?   Patient states he has also been getting very tired but noted he has emphysema.  Patient wants to know next steps.

## 2023-09-02 NOTE — Progress Notes (Signed)
    Cardiology Office Note  Date: 09/03/2023   ID: Paul Arias, DOB [birth year], MRN [id number]  History of Present Illness: Paul Arias is a 57 y.o. male last seen in January.  He is here for a follow-up visit.  He does not report any angina or nitroglycerin use.  Does state that over the last month he has been more short of breath with activity and has experienced mild ankle edema.  He was prescribed as needed Lasix and took 1 dose with improvement in his ankle edema.  No orthopnea or PND.  No palpitations or syncope.  I went over his medications.  He is on Repatha and Crestor, also Toprol-XL and as needed nitroglycerin.  Most recent lab work is reviewed below with total cholesterol down to 86 and LDL of 2 as of July.  ECG today shows sinus bradycardia.  Physical Exam: VS:  BP 128/74   Pulse (!) 58   Ht 6\' 3"  (1.905 m)   Wt 202 lb 3.2 oz (91.7 kg)   SpO2 94%   BMI 25.27 kg/m , BMI Body mass index is 25.27 kg/m.  Wt Readings from Last 3 Encounters:  09/03/23 202 lb 3.2 oz (91.7 kg)  02/11/23 199 lb 3.2 oz (90.4 kg)  01/05/23 203 lb 3.2 oz (92.2 kg)    General: Patient appears comfortable at rest. HEENT: Conjunctiva and lids normal. Neck: Supple, no elevated JVP or carotid bruits. Lungs: Clear to auscultation, nonlabored breathing at rest. Cardiac: Regular rate and rhythm, no S3 or significant systolic murmur. Extremities: No pitting edema.  ECG:  An ECG dated 07/01/2022 was personally reviewed today and demonstrated:  Sinus bradycardia.  Labwork:  July 2024: Hemoglobin 11.8, platelets 216, BUN 15, creatinine 0.95, potassium 4.4, AST 33, ALT 29, cholesterol 86, triglycerides 275, HDL 45, LDL 2  Other Studies Reviewed Today:  No interval cardiac testing for review today.  Assessment and Plan:  1.  CAD with chronically occluded RCA associated with left-to-right collaterals and moderate proximal to mid LAD stenosis managed medically.  Follow-up Lexiscan Myoview  in February 2023 was low risk showing no active ischemia with LVEF 59%.  He does not describe any angina or nitroglycerin use.  Has been more short of breath with activity in the last month with mild ankle edema.  Plan to update echocardiogram for reassessment of LVEF.  Continue as needed use of Lasix along with Toprol-XL and Repatha.  2.  Mixed hyperlipidemia.  Currently on Repatha and Crestor 40 mg daily. LDL down to 2.  Plan to reduce Crestor to 10 mg daily, may ultimately be able to discontinue pending on his follow-up lipids over time.  Disposition:  Follow up  test results.  Signed, Jonelle Sidle, M.D., F.A.C.C. [facility]

## 2023-09-03 ENCOUNTER — Telehealth: Payer: Self-pay | Admitting: Cardiology

## 2023-09-03 ENCOUNTER — Ambulatory Visit: Payer: 59 | Attending: Cardiology

## 2023-09-03 ENCOUNTER — Ambulatory Visit: Payer: 59 | Attending: Cardiology | Admitting: Cardiology

## 2023-09-03 ENCOUNTER — Encounter: Payer: Self-pay | Admitting: Cardiology

## 2023-09-03 VITALS — BP 128/74 | HR 58 | Ht 75.0 in | Wt 202.2 lb

## 2023-09-03 DIAGNOSIS — R0602 Shortness of breath: Secondary | ICD-10-CM

## 2023-09-03 DIAGNOSIS — E782 Mixed hyperlipidemia: Secondary | ICD-10-CM | POA: Diagnosis not present

## 2023-09-03 DIAGNOSIS — I25119 Atherosclerotic heart disease of native coronary artery with unspecified angina pectoris: Secondary | ICD-10-CM

## 2023-09-03 LAB — ECHOCARDIOGRAM COMPLETE
AR max vel: 3.9 cm2
AV Area VTI: 3.2 cm2
AV Area mean vel: 3.18 cm2
AV Mean grad: 5 mmHg
AV Peak grad: 9 mmHg
Ao pk vel: 1.5 m/s
Area-P 1/2: 3.19 cm2
Calc EF: 67.3 %
Height: 75 in
MV VTI: 2.92 cm2
S' Lateral: 3.4 cm
Single Plane A2C EF: 70 %
Single Plane A4C EF: 64.3 %
Weight: 3235.2 oz

## 2023-09-03 MED ORDER — ROSUVASTATIN CALCIUM 10 MG PO TABS: 10.0000 mg | ORAL_TABLET | Freq: Every day | ORAL | 3 refills | Status: DC

## 2023-09-03 NOTE — Patient Instructions (Addendum)
Medication Instructions:  Your physician has recommended you make the following change in your medication:  Decrease rosuvastatin to 10 mg daily Continue all other medications as prescribed  Labwork: none  Testing/Procedures: Your physician has requested that you have an echocardiogram. Echocardiography is a painless test that uses sound waves to create images of your heart. It provides your doctor with information about the size and shape of your heart and how well your heart's chambers and valves are working. This procedure takes approximately one hour. There are no restrictions for this procedure. Please do NOT wear cologne, perfume, aftershave, or lotions (deodorant is allowed). Please arrive 15 minutes prior to your appointment time.  Follow-Up: Your physician recommends that you schedule a follow-up appointment in: pending  Any Other Special Instructions Will Be Listed Below (If Applicable).  If you need a refill on your cardiac medications before your next appointment, please call your pharmacy.

## 2023-09-03 NOTE — Telephone Encounter (Signed)
Checking percert on the following patient for   ECHO - scheduled in the [facility] for today 09/03/2023

## 2023-09-04 ENCOUNTER — Telehealth: Payer: Self-pay | Admitting: *Deleted

## 2023-09-04 ENCOUNTER — Encounter: Payer: Self-pay | Admitting: *Deleted

## 2023-09-04 DIAGNOSIS — I25119 Atherosclerotic heart disease of native coronary artery with unspecified angina pectoris: Secondary | ICD-10-CM

## 2023-09-04 DIAGNOSIS — R0602 Shortness of breath: Secondary | ICD-10-CM

## 2023-09-04 NOTE — Telephone Encounter (Signed)
Patient informed and verbalized understanding of plan. Lexiscan instructions read to patient and made available in mychart Verbalized understanding Copy sent to PCP

## 2023-09-04 NOTE — Telephone Encounter (Signed)
-----   Message from Nona Dell sent at 09/03/2023  4:52 PM EDT ----- Results reviewed.  Echocardiogram does not show any deterioration in LVEF, still normal range at 60 to 65%.  Given his exertional symptoms, I think we should go ahead and repeat a stress test, let's get him set up for a Lexiscan Myoview on current medical therapy.

## 2023-09-04 NOTE — Telephone Encounter (Signed)
Checking percert on the following patient for testing scheduled at [facility].   LEXISCAN   09/11/2023

## 2023-09-11 ENCOUNTER — Encounter (HOSPITAL_COMMUNITY): Payer: 59

## 2023-09-11 ENCOUNTER — Encounter (HOSPITAL_COMMUNITY): Payer: Self-pay

## 2023-09-11 ENCOUNTER — Other Ambulatory Visit (HOSPITAL_COMMUNITY): Payer: 59

## 2023-09-17 ENCOUNTER — Telehealth (HOSPITAL_COMMUNITY): Payer: Self-pay | Admitting: Emergency Medicine

## 2023-09-18 ENCOUNTER — Encounter (HOSPITAL_BASED_OUTPATIENT_CLINIC_OR_DEPARTMENT_OTHER)
Admission: RE | Admit: 2023-09-18 | Discharge: 2023-09-18 | Disposition: A | Discharge status: Home / Self Care | Payer: 59 | Source: Ambulatory Visit | Attending: Cardiology | Admitting: Cardiology

## 2023-09-18 ENCOUNTER — Ambulatory Visit (HOSPITAL_COMMUNITY)
Admission: RE | Admit: 2023-09-18 | Discharge: 2023-09-18 | Disposition: A | Discharge status: Home / Self Care | Payer: 59 | Source: Ambulatory Visit | Attending: Cardiology | Admitting: Cardiology

## 2023-09-18 ENCOUNTER — Encounter (HOSPITAL_COMMUNITY): Payer: Self-pay

## 2023-09-18 DIAGNOSIS — I25119 Atherosclerotic heart disease of native coronary artery with unspecified angina pectoris: Secondary | ICD-10-CM | POA: Diagnosis present

## 2023-09-18 DIAGNOSIS — R0602 Shortness of breath: Secondary | ICD-10-CM | POA: Insufficient documentation

## 2023-09-18 LAB — NM MYOCAR MULTI W/SPECT W/WALL MOTION / EF
LV dias vol: 133 mL (ref 62–150)
LV sys vol: 47 mL
Nuc Stress EF: 65 %
Peak HR: 89 {beats}/min
RATE: 0.4
Rest HR: 57 {beats}/min
Rest Nuclear Isotope Dose: 10.2 mCi
SDS: 5
SRS: 5
SSS: 10
ST Depression (mm): 0 mm
Stress Nuclear Isotope Dose: 32 mCi
TID: 1.26

## 2023-09-18 MED ORDER — REGADENOSON 0.4 MG/5ML IV SOLN
INTRAVENOUS | Status: AC
  Administered 2023-09-18: 0.4 mg via INTRAVENOUS
  Filled 2023-09-18: qty 5

## 2023-09-18 MED ORDER — TECHNETIUM TC 99M TETROFOSMIN IV KIT
10.0000 | PACK | Freq: Once | INTRAVENOUS | Status: AC | PRN
Start: 2023-09-18 — End: 2023-09-18
  Administered 2023-09-18: 10.2 via INTRAVENOUS

## 2023-09-18 MED ORDER — TECHNETIUM TC 99M TETROFOSMIN IV KIT
30.0000 | PACK | Freq: Once | INTRAVENOUS | Status: AC | PRN
  Administered 2023-09-18: 32 via INTRAVENOUS

## 2023-09-18 MED ORDER — SODIUM CHLORIDE FLUSH 0.9 % IV SOLN
INTRAVENOUS | Status: AC
  Administered 2023-09-18: 10 mL via INTRAVENOUS
  Filled 2023-09-18: qty 10

## 2023-09-21 ENCOUNTER — Telehealth: Payer: Self-pay | Admitting: *Deleted

## 2023-09-21 MED ORDER — ISOSORBIDE MONONITRATE ER 30 MG PO TB24: 15.0000 mg | ORAL_TABLET | Freq: Every day | ORAL | 1 refills | Status: DC

## 2023-09-21 NOTE — Telephone Encounter (Signed)
-----   Message from Nona Dell sent at 09/18/2023  4:37 PM EDT ----- Results reviewed.  I reviewed the images as well.  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  Suggest adding Imdur beginning at 15 mg once in the evening and increasing to 30 mg after a week.  Please make a office follow-up within the next few months to review symptom control.

## 2023-09-21 NOTE — Telephone Encounter (Signed)
Patient informed and verbalized understanding of plan. Copy sent to PCP

## 2023-09-22 NOTE — Telephone Encounter (Signed)
Stress test result explained to patient again in detail. Verbalized understanding.

## 2023-09-22 NOTE — Telephone Encounter (Signed)
Patient is returning call in regards to these results again. States he did not completely understand the results given to him. Please advise.

## 2023-10-06 ENCOUNTER — Ambulatory Visit (INDEPENDENT_AMBULATORY_CARE_PROVIDER_SITE_OTHER): Payer: 59 | Admitting: Gastroenterology

## 2023-10-06 ENCOUNTER — Encounter: Payer: Self-pay | Admitting: Gastroenterology

## 2023-10-06 VITALS — BP 137/74 | HR 64 | Temp 97.8°F | Ht 75.0 in | Wt 201.0 lb

## 2023-10-06 DIAGNOSIS — Z8719 Personal history of other diseases of the digestive system: Secondary | ICD-10-CM

## 2023-10-06 DIAGNOSIS — D649 Anemia, unspecified: Secondary | ICD-10-CM | POA: Diagnosis not present

## 2023-10-06 DIAGNOSIS — K219 Gastro-esophageal reflux disease without esophagitis: Secondary | ICD-10-CM

## 2023-10-06 DIAGNOSIS — R197 Diarrhea, unspecified: Secondary | ICD-10-CM | POA: Insufficient documentation

## 2023-10-06 DIAGNOSIS — K2100 Gastro-esophageal reflux disease with esophagitis, without bleeding: Secondary | ICD-10-CM

## 2023-10-06 DIAGNOSIS — K2270 Barrett's esophagus without dysplasia: Secondary | ICD-10-CM

## 2023-10-06 NOTE — Progress Notes (Signed)
GI Office Note    Referring Provider: Benita Stabile, MD Primary Care Physician:  Benita Stabile, MD  Primary Gastroenterologist: Roetta Sessions, MD   Chief Complaint   Chief Complaint  Patient presents with   Diarrhea    Having issues with diarrhea, started a couple weeks ago     History of Present Illness   Paul Arias is a 57 y.o. male presenting today for further evaluation of diarrhea. Last seen in 02/2023.  Barrett's esophagus and duodenal adenoma (first noted in 2017).He has undergone multiple procedures for surveillance and eventual EMR by Dr. Meridee Score in 04/2020. Also with history of multiple colonic adenomas. H/o etoh dependence. Subsequent esophageal biopsies not supportive of barrett's.   Complains of diarrhea over the past 2 weeks.  Initially was having 10 stools daily.  He was not able to work 1 day last week.  Appetite poor.  Denies any ill contacts, antibiotic use, travel.  No recent COVID.  New medication consist of isosorbide mononitrate, started 2 weeks ago.  Denies any abdominal pain.  No melena or rectal bleeding.  States his stool is dark on over-the-counter iron which was recently started.  States he also has had some morning time sore throat which his PCP thinks may be acid reflux related and he was switched from omeprazole 40 mg to pantoprazole 40 mg daily.  No typical heartburn symptoms.  This week his appetite has started to improve.  He has had some solid stool but diarrhea persists as well.  Labs from 10/02/2023: White blood cell count 5200, hemoglobin 12.7 low, hematocrit 39.5, MCV 93, platelets 272,000.    Labs from July 2024: Hemoglobin 11.8, hematocrit 36, albumin 4.3, total bilirubin 0.2, alkaline phosphatase 52, AST 33, ALT 29   Abd U/S 07/2022: -normal   Colonoscopy 04/2022: -Non-bleeding internal hemorrhoids. - One 8 mm polyp in the splenic flexure, removed with a cold snare. Resected and retrieved. - The examination was otherwise normal  on direct and retroflexion views. -tubular adenoma, next colonoscopy in five years.    EGD 04/2022: -Minimal adenomatous changes in the duodenal bulb as described above and treated as described above. -Abnormal esophagus consistent with short segment Barrett's esophagus status post biopsy -repeat in 5 years at same time of colonoscopy  A. DUODENUM, POLYPECTOMY:  Minute polypoid fragments of duodenal mucosa mixed with large amount of  neutrophils and acellular debris.  Negative for adenomatous change, dysplasia and malignancy.   B. ESOPHAGUS, BIOPSY:  Nonspecific inflammation in that fragment of gastric mucosa without  goblet cell metaplasia.  Adjacent part of normal esophageal mucosa.  There are no diagnostic features of Barrett's esophagus and eosinophilic  esophagitis.  Negative for dysplasia and malignancy.   C. COLON, SPLENIC FLEXURE, POLYPECTOMY:  Tubular adenoma.  Negative for high-grade dysplasia    Medications   Current Outpatient Medications  Medication Sig Dispense Refill   ALPRAZolam (XANAX) 1 MG tablet Take 2 mg by mouth at bedtime.      Biotin 800 MCG TABS Take by mouth daily.     Evolocumab (REPATHA SURECLICK) 140 MG/ML SOAJ Inject 1 Dose into the skin every 14 (fourteen) days. 2 mL 4   fenofibrate 160 MG tablet Take 160 mg by mouth at bedtime.      HYDROcodone-acetaminophen (NORCO) 10-325 MG tablet Take 1 tablet by mouth every 6 (six) hours as needed (for pain).     isosorbide mononitrate (IMDUR) 30 MG 24 hr tablet Take 0.5 tablets (15 mg total)  by mouth at bedtime. For 1 week, then increase to 30 mg daily at bedtime 90 tablet 1   magnesium oxide (MAG-OX) 400 MG tablet Take 400 mg by mouth daily.     metoprolol succinate (TOPROL-XL) 25 MG 24 hr tablet TAKE 1 TABLET BY MOUTH ONCE DAILY. 90 tablet 3   nitroGLYCERIN (NITROSTAT) 0.4 MG SL tablet Place 1 tablet (0.4 mg total) under the tongue every 5 (five) minutes as needed for chest pain. 25 tablet 3   pantoprazole  (PROTONIX) 40 MG tablet Take 40 mg by mouth daily.     rosuvastatin (CRESTOR) 10 MG tablet Take 1 tablet (10 mg total) by mouth daily. 90 tablet 3   traZODone (DESYREL) 50 MG tablet Take 50 mg by mouth at bedtime.     vitamin B-12 (CYANOCOBALAMIN) 500 MCG tablet Take 500 mcg by mouth daily.     Vitamin D-Vitamin K (VITAMIN K2-VITAMIN D3 PO) Take by mouth daily.     zinc gluconate 50 MG tablet Take 50 mg by mouth daily.     No current facility-administered medications for this visit.    Allergies   Allergies as of 10/06/2023   (No Known Allergies)       Review of Systems   General: Negative for anorexia, weight loss, fever, chills, +fatigue, weakness. Eyes: Negative for vision changes.  ENT: Negative for hoarseness, difficulty swallowing , nasal congestion. See hpi CV: Negative for chest pain, angina, palpitations, +dyspnea on exertion, +peripheral edema.  Respiratory: Negative for dyspnea at rest, +dyspnea on exertion, cough, sputum, wheezing.  GI: See history of present illness. GU:  Negative for dysuria, hematuria, urinary incontinence, urinary frequency, nocturnal urination.  MS: Negative for joint pain, low back pain.  Derm: Negative for rash or itching.  Neuro: Negative for weakness, abnormal sensation, seizure, frequent headaches, memory loss,  confusion.  Psych: Negative for anxiety, depression, suicidal ideation, hallucinations.  Endo: Negative for unusual weight change.  Heme: Negative for bruising or bleeding. Allergy: Negative for rash or hives.  Physical Exam   BP 137/74 (BP Location: Right Arm, Patient Position: Sitting, Cuff Size: Normal)   Pulse 64   Temp 97.8 F (36.6 C) (Oral)   Ht 6\' 3"  (1.905 m)   Wt 201 lb (91.2 kg)   SpO2 96%   BMI 25.12 kg/m    General: Well-nourished, well-developed in no acute distress.  Head: Normocephalic, atraumatic.   Eyes: Conjunctiva pink, no icterus. Mouth: Oropharyngeal mucosa moist and pink  Neck: Supple without  thyromegaly, masses, or lymphadenopathy.  Lungs: Clear to auscultation bilaterally.  Heart: Regular rate and rhythm, no murmurs rubs or gallops.  Abdomen: Bowel sounds are normal, nontender, nondistended, no hepatosplenomegaly or masses,  no abdominal bruits or hernia, no rebound or guarding.   Rectal: not performed Extremities: No lower extremity edema. No clubbing or deformities.  Neuro: Alert and oriented x 4 , grossly normal neurologically.  Skin: Warm and dry, no rash or jaundice.   Psych: Alert and cooperative, normal mood and affect.  Labs   See hpi  Imaging Studies   NM Myocar Multi W/Spect W/Wall Motion / EF  Result Date: 09/18/2023   There is a large moderate intensity inferior wall defect with mild reversibility and relatively normal wall motion. There is signifcant adjacent gut radiotracer uptake that complicates interpretation of perfusion defect. Findings likely due to gut artifact, cannot completely exclude prior inferior infarct with mild-peri infarct ischemia. Either finding would support low risk.   The study is low risk.  No ST deviation was noted.   Left ventricular function is normal. Nuclear stress EF: 65%. The left ventricular ejection fraction is normal (55-65%). End diastolic cavity size is mildly enlarged.    Assessment   Diarrhea -recent onset less than 2 weeks ago. Began acutely. Seems to be resolving. Suspect food borne or viral. Not likely due to isosorbide mononitrate which he began around the time of onset of diarrhea. -will continue to monitor but if persistent diarrhea over next couple of days, he will submit stool for GI profile and will let us know if ongoing symptoms  GERD -some early am sore throat may be due to PND vs GERD. PCP switch him from omeprazole to pantoprazole 40 mg every morning.  He has a history of significant daytime symptoms off PPI.  Would likely not tolerate taking p.m. dosing only therefore will increase him to twice daily over  the next 2 weeks.  He has some leftover omeprazole at home which she will take at bedtime until he runs out.  He will call with any ongoing symptoms.  Recommend further cutting back on alcohol use, continues to drink 2-3 drinks most nights.  Anemia -As outlined above.  MCV normal.  Iron studies not completed.  Improving with subsequent follow-up labs.  Denies overt GI bleeding.  EGD and colonoscopy up-to-date as outlined.  Return office visit in 3 months, we will update labs at that time.  Further evaluation pending findings.  History of Barrett's and duodenal adenoma as outlined above.  Plan for EGD surveillance in 2028.  May require sooner if ongoing anemia issues and/or reflux issues.  Leanna Battles. Melvyn Neth, MHS, PA-C [facility]

## 2023-10-06 NOTE — Patient Instructions (Signed)
I suspect your recent diarrhea is viral or foodborne. It appears that you are already feeling better and I would suspect recovery over the next several days.   Your new cardiac medication is not likely the cause of your diarrhea.   If your diarrhea persists, submit stool for testing for infection. We have provided you with orders today.   Try to cut back further on alcohol use.   Please call my CMA Tammy at [phone] if ongoing diarrhea.   Take pantoprazole 40mg  every morning. Use your old omeprazole and take 40mg  before supper or at bedtime to see if this helps your sore throat. Do this for a couple of weeks or until you run out of omeprazole.  Otherwise return office visit in 3 months.

## 2023-10-08 ENCOUNTER — Other Ambulatory Visit (HOSPITAL_COMMUNITY): Payer: 59

## 2023-10-12 ENCOUNTER — Telehealth: Payer: Self-pay | Admitting: Cardiology

## 2023-10-12 DIAGNOSIS — R0602 Shortness of breath: Secondary | ICD-10-CM

## 2023-10-12 NOTE — Telephone Encounter (Signed)
Left message for patient to return call on advice and having test done

## 2023-10-12 NOTE — Telephone Encounter (Signed)
Patient is calling requesting a chest xray be ordered. He reports he hasn't had one in years and doesn't feel the med change and lexiscan gave the answers or resolved the issue. Please advise.

## 2023-10-12 NOTE — Telephone Encounter (Signed)
Patient verbalized understanding and will have test done one day this week

## 2023-10-12 NOTE — Telephone Encounter (Signed)
Test ordered

## 2023-10-14 ENCOUNTER — Ambulatory Visit (HOSPITAL_COMMUNITY)
Admission: RE | Admit: 2023-10-14 | Discharge: 2023-10-14 | Disposition: A | Discharge status: Home / Self Care | Payer: 59 | Source: Ambulatory Visit | Attending: Cardiology | Admitting: Cardiology

## 2023-10-14 DIAGNOSIS — R0602 Shortness of breath: Secondary | ICD-10-CM | POA: Diagnosis present

## 2023-11-02 NOTE — Progress Notes (Signed)
Cardiology Office Note:  .   Date:  11/16/2023  ID:  Paul Arias, DOB [birth year], MRN [id number] PCP: Benita Stabile, MD   HeartCare Providers Cardiologist:  Nona Dell, MD    History of Present Illness: Marland Kitchen   Paul Arias is a 57 y.o. male  with history of HLD &  CAD with chronically occluded RCA associated with left-to-right collaterals and moderate proximal to mid LAD stenosis managed medically. Follow-up Lexiscan Myoview in February 2023 was low risk showing no active ischemia with LVEF 59%.  Patient saw Dr. Diona Browner 08/2023 with worsening DOE but no angina. Echo normal LVEF, NST mild to mod peri-infarct ischemia-Imdur added. CXR no effusion or infiltrate.  Patient says when he walks up a flight of stairs he gets short of breath and legs feel weak. 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 Can walk 1 mile flat without any problem. Has to take a deep breath. Imdur hasn't helped at all. No longer smokes. DOE going up any incline. Similar to when he had his first cath.   ROS:    Studies Reviewed: Marland Kitchen         Prior CV Studies:   NST 09/2023  There is a large moderate intensity inferior wall defect with mild reversibility and relatively normal wall motion. There is signifcant adjacent gut radiotracer uptake that complicates interpretation of perfusion defect. Findings likely due to gut artifact, cannot completely exclude prior inferior infarct with mild-peri infarct ischemia. Either finding would support low risk.   The study is low risk.   No ST deviation was noted.   Left ventricular function is normal. Nuclear stress EF: 65%. The left ventricular ejection fraction is normal (55-65%). End diastolic cavity size is mildly enlarged.  Echo 08/2023 IMPRESSIONS     1. Left ventricular ejection fraction, by estimation, is 60 to 65%. The  left ventricle has normal function. The left ventricle has no regional  wall motion abnormalities. Left  ventricular diastolic parameters were  normal. The average left ventricular  global longitudinal strain is -19.9 %. The global longitudinal strain is  normal.   2. Right ventricular systolic function is normal. The right ventricular  size is normal. There is normal pulmonary artery systolic pressure. The  estimated right ventricular systolic pressure is 32.8 mmHg.   3. The mitral valve is grossly normal. Mild mitral valve regurgitation.   4. The aortic valve is tricuspid. Aortic valve regurgitation is not  visualized. Aortic valve mean gradient measures 5.0 mmHg.   5. The inferior vena cava is normal in size with greater than 50%  respiratory variability, suggesting right atrial pressure of 3 mmHg.   Comparison(s): No prior Echocardiogram.    Risk Assessment/Calculations:     HYPERTENSION CONTROL Vitals:   11/16/23 1132 11/16/23 1159  BP: (!) 148/72 (!) 142/80    The patient's blood pressure is elevated above target today.  In order to address the patient's elevated BP: Blood pressure will be monitored at home to determine if medication changes need to be made.; Follow up with general cardiology has been recommended.          Physical Exam:   VS:  BP (!) 142/80   Pulse (!) 57   Ht 6\' 2"  (1.88 m)   Wt 206 lb 6.4 oz (93.6 kg)   SpO2 95%   BMI 26.50 kg/m    Wt Readings from Last 3 Encounters:  11/16/23 206 lb 6.4 oz (93.6 kg)  10/06/23 201 lb (91.2 kg)  09/03/23 202 lb 3.2 oz (91.7 kg)    GEN: Well nourished, well developed in no acute distress NECK: No JVD; No carotid bruits CARDIAC:  RRR, no murmurs, rubs, gallops RESPIRATORY:  Clear to auscultation without rales, wheezing or rhonchi  ABDOMEN: Soft, non-tender, non-distended EXTREMITIES:  No edema; No deformity   ASSESSMENT AND PLAN: .    DOE-NST 09/2023 mild to mod peri-infarct ischemia with normal LVEF. Imdur added and didn't make any difference. Same symptoms he had 2021. I discussed with Dr. Diona Browner who  recommends proceeding with LHC. Patient agreeable. I have reviewed the risks, indications, and alternatives to angioplasty and stenting with the patient. Risks include but are not limited to bleeding, infection, vascular injury, stroke, myocardial infection, arrhythmia, kidney injury, radiation-related injury in the case of prolonged fluoroscopy use, emergency cardiac surgery, and death. The patient understands the risks of serious complication is low (<1%) and patient agrees to proceed.    CAD with chronically occluded RCA associated with left-to-right collaterals and moderate proximal to mid LAD stenosis managed medically. Follow-up Lexiscan Myoview in February 2023 was low risk showing no active ischemia with LVEF 59%. Now with lexiscan mild to mod ischemia. Plan LHC.  HLD on Repatha and crestor(reduced to 10 mg LOV)  HTN-BP somewhat elevated. Eats a lot of fast food. 2 gm sodium diet discussed.      Informed Consent   Shared Decision Making/Informed Consent The risks [stroke (1 in 1000), death (1 in 1000), kidney failure [usually temporary] (1 in 500), bleeding (1 in 200), allergic reaction [possibly serious] (1 in 200)], benefits (diagnostic support and management of coronary artery disease) and alternatives of a cardiac catheterization were discussed in detail with Paul Arias and he is willing to proceed.     Dispo: f/u post hospital.  Signed, Jacolyn Reedy, PA-C

## 2023-11-02 NOTE — H&P (View-Only) (Signed)
 Cardiology Office Note:  .   Date:  11/16/2023  ID:  Paul Arias, DOB [birth year], MRN [id number] PCP: Benita Stabile, MD   HeartCare Providers Cardiologist:  Nona Dell, MD    History of Present Illness: Marland Kitchen   Paul Arias is a 57 y.o. male  with history of HLD &  CAD with chronically occluded RCA associated with left-to-right collaterals and moderate proximal to mid LAD stenosis managed medically. Follow-up Lexiscan Myoview in February 2023 was low risk showing no active ischemia with LVEF 59%.  Patient saw Dr. Diona Browner 08/2023 with worsening DOE but no angina. Echo normal LVEF, NST mild to mod peri-infarct ischemia-Imdur added. CXR no effusion or infiltrate.  Patient says when he walks up a flight of stairs he gets short of breath and legs feel weak. 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 Can walk 1 mile flat without any problem. Has to take a deep breath. Imdur hasn't helped at all. No longer smokes. DOE going up any incline. Similar to when he had his first cath.   ROS:    Studies Reviewed: Marland Kitchen         Prior CV Studies:   NST 09/2023  There is a large moderate intensity inferior wall defect with mild reversibility and relatively normal wall motion. There is signifcant adjacent gut radiotracer uptake that complicates interpretation of perfusion defect. Findings likely due to gut artifact, cannot completely exclude prior inferior infarct with mild-peri infarct ischemia. Either finding would support low risk.   The study is low risk.   No ST deviation was noted.   Left ventricular function is normal. Nuclear stress EF: 65%. The left ventricular ejection fraction is normal (55-65%). End diastolic cavity size is mildly enlarged.  Echo 08/2023 IMPRESSIONS     1. Left ventricular ejection fraction, by estimation, is 60 to 65%. The  left ventricle has normal function. The left ventricle has no regional  wall motion abnormalities. Left  ventricular diastolic parameters were  normal. The average left ventricular  global longitudinal strain is -19.9 %. The global longitudinal strain is  normal.   2. Right ventricular systolic function is normal. The right ventricular  size is normal. There is normal pulmonary artery systolic pressure. The  estimated right ventricular systolic pressure is 32.8 mmHg.   3. The mitral valve is grossly normal. Mild mitral valve regurgitation.   4. The aortic valve is tricuspid. Aortic valve regurgitation is not  visualized. Aortic valve mean gradient measures 5.0 mmHg.   5. The inferior vena cava is normal in size with greater than 50%  respiratory variability, suggesting right atrial pressure of 3 mmHg.   Comparison(s): No prior Echocardiogram.    Risk Assessment/Calculations:     HYPERTENSION CONTROL Vitals:   11/16/23 1132 11/16/23 1159  BP: (!) 148/72 (!) 142/80    The patient's blood pressure is elevated above target today.  In order to address the patient's elevated BP: Blood pressure will be monitored at home to determine if medication changes need to be made.; Follow up with general cardiology has been recommended.          Physical Exam:   VS:  BP (!) 142/80   Pulse (!) 57   Ht 6\' 2"  (1.88 m)   Wt 206 lb 6.4 oz (93.6 kg)   SpO2 95%   BMI 26.50 kg/m    Wt Readings from Last 3 Encounters:  11/16/23 206 lb 6.4 oz (93.6 kg)  10/06/23 201 lb (91.2 kg)  09/03/23 202 lb 3.2 oz (91.7 kg)    GEN: Well nourished, well developed in no acute distress NECK: No JVD; No carotid bruits CARDIAC:  RRR, no murmurs, rubs, gallops RESPIRATORY:  Clear to auscultation without rales, wheezing or rhonchi  ABDOMEN: Soft, non-tender, non-distended EXTREMITIES:  No edema; No deformity   ASSESSMENT AND PLAN: .    DOE-NST 09/2023 mild to mod peri-infarct ischemia with normal LVEF. Imdur added and didn't make any difference. Same symptoms he had 2021. I discussed with Dr. Diona Browner who  recommends proceeding with LHC. Patient agreeable. I have reviewed the risks, indications, and alternatives to angioplasty and stenting with the patient. Risks include but are not limited to bleeding, infection, vascular injury, stroke, myocardial infection, arrhythmia, kidney injury, radiation-related injury in the case of prolonged fluoroscopy use, emergency cardiac surgery, and death. The patient understands the risks of serious complication is low (<1%) and patient agrees to proceed.    CAD with chronically occluded RCA associated with left-to-right collaterals and moderate proximal to mid LAD stenosis managed medically. Follow-up Lexiscan Myoview in February 2023 was low risk showing no active ischemia with LVEF 59%. Now with lexiscan mild to mod ischemia. Plan LHC.  HLD on Repatha and crestor(reduced to 10 mg LOV)  HTN-BP somewhat elevated. Eats a lot of fast food. 2 gm sodium diet discussed.      Informed Consent   Shared Decision Making/Informed Consent The risks [stroke (1 in 1000), death (1 in 1000), kidney failure [usually temporary] (1 in 500), bleeding (1 in 200), allergic reaction [possibly serious] (1 in 200)], benefits (diagnostic support and management of coronary artery disease) and alternatives of a cardiac catheterization were discussed in detail with Paul Arias and he is willing to proceed.     Dispo: f/u post hospital.  Signed, Jacolyn Reedy, PA-C

## 2023-11-08 ENCOUNTER — Other Ambulatory Visit: Payer: Self-pay | Admitting: Cardiology

## 2023-11-16 ENCOUNTER — Encounter: Payer: Self-pay | Admitting: Physician Assistant

## 2023-11-16 ENCOUNTER — Ambulatory Visit: Payer: 59 | Attending: Physician Assistant | Admitting: Physician Assistant

## 2023-11-16 ENCOUNTER — Other Ambulatory Visit (HOSPITAL_COMMUNITY)
Admission: RE | Admit: 2023-11-16 | Discharge: 2023-11-16 | Disposition: A | Discharge status: Home / Self Care | Payer: 59 | Source: Ambulatory Visit | Attending: Physician Assistant | Admitting: Physician Assistant

## 2023-11-16 VITALS — BP 142/80 | HR 57 | Ht 74.0 in | Wt 206.4 lb

## 2023-11-16 DIAGNOSIS — E782 Mixed hyperlipidemia: Secondary | ICD-10-CM | POA: Diagnosis not present

## 2023-11-16 DIAGNOSIS — I25119 Atherosclerotic heart disease of native coronary artery with unspecified angina pectoris: Secondary | ICD-10-CM | POA: Insufficient documentation

## 2023-11-16 DIAGNOSIS — R0602 Shortness of breath: Secondary | ICD-10-CM | POA: Diagnosis not present

## 2023-11-16 DIAGNOSIS — R9439 Abnormal result of other cardiovascular function study: Secondary | ICD-10-CM | POA: Diagnosis not present

## 2023-11-16 LAB — CBC
HCT: 36.9 % — ABNORMAL LOW (ref 39.0–52.0)
Hemoglobin: 12.4 g/dL — ABNORMAL LOW (ref 13.0–17.0)
MCH: 30.8 pg (ref 26.0–34.0)
MCHC: 33.6 g/dL (ref 30.0–36.0)
MCV: 91.6 fL (ref 80.0–100.0)
Platelets: 236 10*3/uL (ref 150–400)
RBC: 4.03 MIL/uL — ABNORMAL LOW (ref 4.22–5.81)
RDW: 13.6 % (ref 11.5–15.5)
WBC: 4.9 10*3/uL (ref 4.0–10.5)
nRBC: 0 % (ref 0.0–0.2)

## 2023-11-16 LAB — BASIC METABOLIC PANEL
Anion gap: 8 (ref 5–15)
BUN: 15 mg/dL (ref 6–20)
CO2: 25 mmol/L (ref 22–32)
Calcium: 9.3 mg/dL (ref 8.9–10.3)
Chloride: 100 mmol/L (ref 98–111)
Creatinine, Ser: 1.12 mg/dL (ref 0.61–1.24)
GFR, Estimated: >60 mL/min (ref >60)
Glucose, Bld: 104 mg/dL — ABNORMAL HIGH (ref 70–99)
Potassium: 4.2 mmol/L (ref 3.5–5.1)
Sodium: 133 mmol/L — ABNORMAL LOW (ref 135–145)

## 2023-11-16 NOTE — Patient Instructions (Addendum)
Medication Instructions:  Your physician recommends that you continue on your current medications as directed. Please refer to the Current Medication list given to you today.  *If you need a refill on your cardiac medications before your next appointment, please call your pharmacy*   Lab Work: CBC BMET  If you have labs (blood work) drawn today and your tests are completely normal, you will receive your results only by: MyChart Message (if you have MyChart) OR A paper copy in the mail If you have any lab test that is abnormal or we need to change your treatment, we will call you to review the results.   Testing/Procedures: Left Heart Cath   Follow-Up: At [facility], you and your health needs are our priority.  As part of our continuing mission to provide you with exceptional heart care, we have created designated Provider Care Teams.  These Care Teams include your primary Cardiologist (physician) and Advanced Practice Providers (APPs -  Physician Assistants and Nurse Practitioners) who all work together to provide you with the care you need, when you need it.  We recommend signing up for the patient portal called "MyChart".  Sign up information is provided on this After Visit Summary.  MyChart is used to connect with patients for Virtual Visits (Telemedicine).  Patients are able to view lab/test results, encounter notes, upcoming appointments, etc.  Non-urgent messages can be sent to your provider as well.   To learn more about what you can do with MyChart, go to [email or web address].    Your next appointment:   1 month(s)  Provider:   You may see Nona Dell, MD or one of the following Advanced Practice Providers on your designated Care Team:   Randall An, PA-C  Jacolyn Reedy, PA-C     Other Instructions  [facility] DEPT OF [facility] [address] [place] [address] Dept:  [phone] Loc: [phone]  Paul Arias  11/16/2023  You are scheduled for a Cardiac Catheterization on Friday, December 13 with Dr. Lorine Bears.  1. Please arrive at the [facility] (Main Entrance A) at [facility]: [address] [place], [place] [address] at 10:00 AM (This time is 2 hour(s) before your procedure to ensure your preparation).   Free valet parking service is available. You will check in at ADMITTING. The support person will be asked to wait in the waiting room.  It is OK to have someone drop you off and come back when you are ready to be discharged.    Special note: Every effort is made to have your procedure done on time. Please understand that emergencies sometimes delay scheduled procedures.  2. Diet: Do not eat solid foods after midnight.  The patient may have clear liquids until 5am upon the day of the procedure.  3. Labs: You will need to have blood drawn on TODAY 11/16/23. You do not need to be fasting.  4. Medication instructions in preparation for your procedure:   Contrast Allergy: No   On the morning of your procedure, take your Aspirin 81 mg and any morning medicines NOT listed above.  You may use sips of water.  5. Plan to go home the same day, you will only stay overnight if medically necessary. 6. Bring a current list of your medications and current insurance cards. 7. You MUST have a responsible person to drive you home. 8. Someone MUST be with you  the first 24 hours after you arrive home or your discharge will be delayed. 9. Please wear clothes that are easy to get on and off and wear slip-on shoes.  Thank you for allowing [place] to care for you!   -- [facility]   Two Gram Sodium Diet 2000 mg  What is Sodium? Sodium is a mineral found naturally in many foods. The most significant source of sodium in the diet is table salt, which is about 40% sodium.  Processed, convenience, and preserved foods also  contain a large amount of sodium.  The body needs only 500 mg of sodium daily to function,  A normal diet provides more than enough sodium even if you do not use salt.  Why Limit Sodium? A build up of sodium in the body can cause thirst, increased blood pressure, shortness of breath, and water retention.  Decreasing sodium in the diet can reduce edema and risk of heart attack or stroke associated with high blood pressure.  Keep in mind that there are many other factors involved in these health problems.  Heredity, obesity, lack of exercise, cigarette smoking, stress and what you eat all play a role.  General Guidelines: Do not add salt at the table or in cooking.  One teaspoon of salt contains over 2 grams of sodium. Read food labels Avoid processed and convenience foods Ask your dietitian before eating any foods not dicussed in the menu planning guidelines Consult your physician if you wish to use a salt substitute or a sodium containing medication such as antacids.  Limit milk and milk products to 16 oz (2 cups) per day.  Shopping Hints: READ LABELS!! "Dietetic" does not necessarily mean low sodium. Salt and other sodium ingredients are often added to foods during processing.    Menu Planning Guidelines Food Group Choose More Often Avoid  Beverages (see also the milk group All fruit juices, low-sodium, salt-free vegetables juices, low-sodium carbonated beverages Regular vegetable or tomato juices, commercially softened water used for drinking or cooking  Breads and Cereals Enriched white, wheat, rye and pumpernickel bread, hard rolls and dinner rolls; muffins, cornbread and waffles; most dry cereals, cooked cereal without added salt; unsalted crackers and breadsticks; low sodium or homemade bread crumbs Bread, rolls and crackers with salted tops; quick breads; instant hot cereals; pancakes; commercial bread stuffing; self-rising flower and biscuit mixes; regular bread crumbs or cracker crumbs   Desserts and Sweets Desserts and sweets mad with mild should be within allowance Instant pudding mixes and cake mixes  Fats Butter or margarine; vegetable oils; unsalted salad dressings, regular salad dressings limited to 1 Tbs; light, sour and heavy cream Regular salad dressings containing bacon fat, bacon bits, and salt pork; snack dips made with instant soup mixes or processed cheese; salted nuts  Fruits Most fresh, frozen and canned fruits Fruits processed with salt or sodium-containing ingredient (some dried fruits are processed with sodium sulfites        Vegetables Fresh, frozen vegetables and low- sodium canned vegetables Regular canned vegetables, sauerkraut, pickled vegetables, and others prepared in brine; frozen vegetables in sauces; vegetables seasoned with ham, bacon or salt pork  Condiments, Sauces, Miscellaneous  Salt substitute with physician's approval; pepper, herbs, spices; vinegar, lemon or lime juice; hot pepper sauce; garlic powder, onion powder, low sodium soy sauce (1 Tbs.); low sodium condiments (ketchup, chili sauce, mustard) in limited amounts (1 tsp.) fresh ground horseradish; unsalted tortilla chips, pretzels, potato chips, popcorn, salsa (1/4 cup) Any seasoning  made with salt including garlic salt, celery salt, onion salt, and seasoned salt; sea salt, rock salt, kosher salt; meat tenderizers; monosodium glutamate; mustard, regular soy sauce, barbecue, sauce, chili sauce, teriyaki sauce, steak sauce, Worcestershire sauce, and most flavored vinegars; canned gravy and mixes; regular condiments; salted snack foods, olives, picles, relish, horseradish sauce, catsup   Food preparation: Try these seasonings Meats:    Pork Sage, onion Serve with applesauce  Chicken Poultry seasoning, thyme, parsley Serve with cranberry sauce  Lamb Curry powder, rosemary, garlic, thyme Serve with mint sauce or jelly  Veal Marjoram, basil Serve with current jelly, cranberry sauce  Beef  Pepper, bay leaf Serve with dry mustard, unsalted chive butter  Fish [place] leaf, dill Serve with unsalted lemon butter, unsalted parsley butter  Vegetables:    Asparagus Lemon juice   Broccoli Lemon juice   Carrots Mustard dressing parsley, mint, nutmeg, glazed with unsalted butter and sugar   Green beans Marjoram, lemon juice, nutmeg,dill seed   Tomatoes Basil, marjoram, onion   Spice /blend for [employer]" 4 tsp ground thyme 1 tsp ground sage 3 tsp ground rosemary 4 tsp ground marjoram   Test your knowledge A product that says "Salt Free" may still contain sodium. True or False Garlic Powder and Hot Pepper Sauce an be used as alternative seasonings.True or False Processed foods have more sodium than fresh foods.  True or False Canned Vegetables have less sodium than froze True or False   WAYS TO DECREASE YOUR SODIUM INTAKE Avoid the use of added salt in cooking and at the table.  Table salt (and other prepared seasonings which contain salt) is probably one of the greatest sources of sodium in the diet.  Unsalted foods can gain flavor from the sweet, sour, and butter taste sensations of herbs and spices.  Instead of using salt for seasoning, try the following seasonings with the foods listed.  Remember: how you use them to enhance natural food flavors is limited only by your creativity... Allspice-Meat, fish, eggs, fruit, peas, red and yellow vegetables Almond Extract-Fruit baked goods Anise Seed-Sweet breads, fruit, carrots, beets, cottage cheese, cookies (tastes like licorice) Basil-Meat, fish, eggs, vegetables, rice, vegetables salads, soups, sauces Bay Leaf-Meat, fish, stews, poultry Burnet-Salad, vegetables (cucumber-like flavor) Caraway Seed-Bread, cookies, cottage cheese, meat, vegetables, cheese, rice Cardamon-Baked goods, fruit, soups Celery Powder or seed-Salads, salad dressings, sauces, meatloaf, soup, bread.Do not use  celery salt Chervil-Meats, salads, fish, eggs,  vegetables, cottage cheese (parsley-like flavor) Chili Power-Meatloaf, chicken cheese, corn, eggplant, egg dishes Chives-Salads cottage cheese, egg dishes, soups, vegetables, sauces Cilantro-Salsa, casseroles Cinnamon-Baked goods, fruit, pork, lamb, chicken, carrots Cloves-Fruit, baked goods, fish, pot roast, green beans, beets, carrots Coriander-Pastry, cookies, meat, salads, cheese (lemon-orange flavor) Cumin-Meatloaf, fish,cheese, eggs, cabbage,fruit pie (caraway flavor) [employer], fruit, eggs, fish, poultry, cottage cheese, vegetables Dill Seed-Meat, cottage cheese, poultry, vegetables, fish, salads, bread Fennel Seed-Bread, cookies, apples, pork, eggs, fish, beets, cabbage, cheese, Licorice-like flavor Garlic-(buds or powder) Salads, meat, poultry, fish, bread, butter, vegetables, potatoes.Do not  use garlic salt Ginger-Fruit, vegetables, baked goods, meat, fish, poultry Horseradish Root-Meet, vegetables, butter Lemon Juice or Extract-Vegetables, fruit, tea, baked goods, fish salads Mace-Baked goods fruit, vegetables, fish, poultry (taste like nutmeg) Maple Extract-Syrups Marjoram-Meat, chicken, fish, vegetables, breads, green salads (taste like Sage) Mint-Tea, lamb, sherbet, vegetables, desserts, carrots, cabbage Mustard, Dry or Seed-Cheese, eggs, meats, vegetables, poultry Nutmeg-Baked goods, fruit, chicken, eggs, vegetables, desserts Onion Powder-Meat, fish, poultry, vegetables, cheese, eggs, bread, rice salads (Do not use   Onion salt)  Orange Extract-Desserts, baked Psychiatric nurse, eggs, cheese, onions, pork, lamb, fish, chicken, vegetables, green salads Paprika-Meat, fish, poultry, eggs, cheese, vegetables Parsley Flakes-Butter, vegetables, meat fish, poultry, eggs, bread, salads (certain forms may   Contain sodium Pepper-Meat fish, poultry, vegetables, eggs Peppermint Extract-Desserts, baked goods Poppy Seed-Eggs, bread, cheese, fruit dressings, baked goods,  noodles, vegetables, cottage  [employer], poultry, meat, fish, cauliflower, turnips,eggs bread Saffron-Rice, bread, veal, chicken, fish, eggs Sage-Meat, fish, poultry, onions, eggplant, tomateos, pork, stews Savory-Eggs, salads, poultry, meat, rice, vegetables, soups, pork Tarragon-Meat, poultry, fish, eggs, butter, vegetables (licorice-like flavor)  Thyme-Meat, poultry, fish, eggs, vegetables, (clover-like flavor), sauces, soups Tumeric-Salads, butter, eggs, fish, rice, vegetables (saffron-like flavor) Vanilla Extract-Baked goods, candy Vinegar-Salads, vegetables, meat marinades Walnut Extract-baked goods, candy   2. Choose your Foods Wisely   The following is a list of foods to avoid which are high in sodium:  Meats-Avoid all smoked, canned, salt cured, dried and kosher meat and fish as well as Anchovies   [employer] meats:Bologna, Liverwurst, Pastrami Canned meat or fish  Marinated herring Caviar    Pepperoni Corned Beef   Pizza Dried chipped beef  Salami Frozen breaded fish or meat Salt pork Frankfurters or hot dogs  Sardines Gefilte fish   Sausage Ham (boiled ham, Proscuitto Smoked butt    spiced ham)   Spam      TV Dinners Vegetables Canned vegetables (Regular) Relish Canned mushrooms  Sauerkraut Olives    Tomato juice Pickles  Bakery and Dessert Products Canned puddings  Cream pies Cheesecake   Decorated cakes Cookies  Beverages/Juices Tomato juice, regular  Gatorade   V-8 vegetable juice, regular  Breads and Cereals Biscuit mixes   Salted potato chips, corn chips, pretzels Bread stuffing mixes  Salted crackers and rolls Pancake and waffle mixes Self-rising flour  Seasonings Accent    Meat sauces Barbecue sauce  Meat tenderizer Catsup    Monosodium glutamate (MSG) Celery salt   Onion salt Chili sauce   Prepared mustard Garlic salt   Salt, seasoned salt, sea salt Gravy mixes   Soy  sauce Horseradish   Steak sauce Ketchup   Tartar sauce Lite salt    Teriyaki sauce Marinade mixes   Worcestershire sauce  Others Baking powder   Cocoa and cocoa mixes Baking soda   Commercial casserole mixes Candy-caramels, chocolate  Dehydrated soups    Bars, fudge,nougats  Instant rice and pasta mixes Canned broth or soup  Maraschino cherries Cheese, aged and processed cheese and cheese spreads  Learning Assessment Quiz  Indicated T (for True) or F (for False) for each of the following statements:  _____ Fresh fruits and vegetables and unprocessed grains are generally low in sodium _____ Water may contain a considerable amount of sodium, depending on the source _____ You can always tell if a food is high in sodium by tasting it _____ Certain laxatives my be high in sodium and should be avoided unless prescribed   by a physician or pharmacist _____ Salt substitutes may be used freely by anyone on a sodium restricted diet _____ Sodium is present in table salt, food additives and as a natural component of   most foods _____ Table salt is approximately 90% sodium _____ Limiting sodium intake may help prevent excess fluid accumulation in the body _____ On a sodium-restricted diet, seasonings such as bouillon soy sauce, and    cooking wine should be used in place of table salt _____ On an ingredient list, a product  which lists monosodium glutamate as the first   ingredient is an appropriate food to include on a low sodium diet  Circle the best answer(s) to the following statements (Hint: there may be more than one correct answer)  11. On a low-sodium diet, some acceptable snack items are:    A. Olives  F. Bean dip   K. Grapefruit juice    B. Salted Pretzels G. Commercial Popcorn   L. Canned peaches    C. Carrot Sticks  H. Bouillon   M. Unsalted nuts   D. [place] fries  I. Peanut butter crackers N. Salami   E. Sweet pickles J. Tomato Juice   O. Pizza  12.  Seasonings that may be  used freely on a reduced - sodium diet include   A. Lemon wedges F.Monosodium glutamate K. Celery seed    B.Soysauce   G. Pepper   L. Mustard powder   C. Sea salt  H. Cooking wine  M. Onion flakes   D. Vinegar  E. Prepared horseradish N. Salsa   E. Sage   J. Worcestershire sauce  O. Chutney

## 2023-11-19 ENCOUNTER — Telehealth: Payer: Self-pay | Admitting: *Deleted

## 2023-11-19 NOTE — Telephone Encounter (Signed)
  Cardiac Catheterization scheduled at [facility] for: Friday November 20, 2023 12 Noon Arrival time [facility] Main Entrance A at: 10 AM  Nothing to eat after midnight prior to procedure, clear liquids until 5 AM day of procedure.  Medication instructions: -Usual morning medications can be taken with sips of water including aspirin 81 mg.  Plan to go home the same day, you will only stay overnight if medically necessary.  You must have responsible adult to drive you home.  Someone must be with you the first 24 hours after you arrive home.  Reviewed procedure instructions with patient.

## 2023-11-20 ENCOUNTER — Ambulatory Visit (HOSPITAL_COMMUNITY)
Admission: RE | Admit: 2023-11-20 | Discharge: 2023-11-20 | Disposition: A | Discharge status: Home / Self Care | Payer: 59 | Attending: Cardiovascular Disease | Admitting: Cardiovascular Disease

## 2023-11-20 ENCOUNTER — Other Ambulatory Visit (HOSPITAL_COMMUNITY): Payer: Self-pay

## 2023-11-20 ENCOUNTER — Other Ambulatory Visit: Payer: Self-pay

## 2023-11-20 ENCOUNTER — Ambulatory Visit (HOSPITAL_COMMUNITY)
Admission: RE | Disposition: A | Discharge status: Home / Self Care | Payer: Self-pay | Source: Home / Self Care | Attending: Cardiovascular Disease

## 2023-11-20 DIAGNOSIS — I25118 Atherosclerotic heart disease of native coronary artery with other forms of angina pectoris: Secondary | ICD-10-CM | POA: Insufficient documentation

## 2023-11-20 DIAGNOSIS — I10 Essential (primary) hypertension: Secondary | ICD-10-CM | POA: Diagnosis not present

## 2023-11-20 DIAGNOSIS — I2584 Coronary atherosclerosis due to calcified coronary lesion: Secondary | ICD-10-CM | POA: Insufficient documentation

## 2023-11-20 DIAGNOSIS — Z87891 Personal history of nicotine dependence: Secondary | ICD-10-CM | POA: Insufficient documentation

## 2023-11-20 DIAGNOSIS — R9439 Abnormal result of other cardiovascular function study: Secondary | ICD-10-CM

## 2023-11-20 DIAGNOSIS — I25119 Atherosclerotic heart disease of native coronary artery with unspecified angina pectoris: Secondary | ICD-10-CM | POA: Insufficient documentation

## 2023-11-20 DIAGNOSIS — I2582 Chronic total occlusion of coronary artery: Secondary | ICD-10-CM | POA: Diagnosis not present

## 2023-11-20 DIAGNOSIS — E785 Hyperlipidemia, unspecified: Secondary | ICD-10-CM | POA: Insufficient documentation

## 2023-11-20 DIAGNOSIS — I2510 Atherosclerotic heart disease of native coronary artery without angina pectoris: Secondary | ICD-10-CM | POA: Insufficient documentation

## 2023-11-20 DIAGNOSIS — Z955 Presence of coronary angioplasty implant and graft: Secondary | ICD-10-CM

## 2023-11-20 DIAGNOSIS — R0602 Shortness of breath: Secondary | ICD-10-CM

## 2023-11-20 HISTORY — PX: LEFT HEART CATH AND CORONARY ANGIOGRAPHY: CATH118249

## 2023-11-20 HISTORY — PX: CORONARY PRESSURE/FFR STUDY: CATH118243

## 2023-11-20 HISTORY — PX: CORONARY STENT INTERVENTION: CATH118234

## 2023-11-20 LAB — POCT ACTIVATED CLOTTING TIME
Activated Clotting Time: 233 s
Activated Clotting Time: 371 s

## 2023-11-20 SURGERY — LEFT HEART CATH AND CORONARY ANGIOGRAPHY
Anesthesia: LOCAL

## 2023-11-20 MED ORDER — ISOSORBIDE MONONITRATE ER 30 MG PO TB24: 30.0000 mg | ORAL_TABLET | Freq: Every day | ORAL | Status: DC

## 2023-11-20 MED ORDER — LIDOCAINE HCL (PF) 1 % IJ SOLN
INTRAMUSCULAR | Status: DC | PRN
  Administered 2023-11-20: 2 mL

## 2023-11-20 MED ORDER — SODIUM CHLORIDE 0.9 % WEIGHT BASED INFUSION: 1.0000 mL/kg/h | INTRAVENOUS | Status: DC

## 2023-11-20 MED ORDER — ASPIRIN EFFERVESCENT 325 MG PO TBEF: 650.0000 mg | EFFERVESCENT_TABLET | Freq: Four times a day (QID) | ORAL | Status: DC | PRN

## 2023-11-20 MED ORDER — CLOPIDOGREL BISULFATE 75 MG PO TABS: 75.0000 mg | ORAL_TABLET | Freq: Every day | ORAL | 1 refills | Status: DC

## 2023-11-20 MED ORDER — VERAPAMIL HCL 2.5 MG/ML IV SOLN
INTRAVENOUS | Status: DC | PRN
  Administered 2023-11-20: 10 mL via INTRA_ARTERIAL

## 2023-11-20 MED ORDER — ZINC GLUCONATE 50 MG PO TABS: 50.0000 mg | ORAL_TABLET | Freq: Every day | ORAL | Status: DC

## 2023-11-20 MED ORDER — NITROGLYCERIN 1 MG/10 ML FOR IR/CATH LAB
INTRA_ARTERIAL | Status: AC
  Filled 2023-11-20: qty 10

## 2023-11-20 MED ORDER — ASPIRIN 81 MG PO CHEW
CHEWABLE_TABLET | ORAL | Status: AC
  Administered 2023-11-20: 81 mg via ORAL
  Filled 2023-11-20: qty 1

## 2023-11-20 MED ORDER — VERAPAMIL HCL 2.5 MG/ML IV SOLN
INTRAVENOUS | Status: AC
  Filled 2023-11-20: qty 2

## 2023-11-20 MED ORDER — BIOTIN 800 MCG PO TABS: ORAL_TABLET | Freq: Every day | ORAL | Status: DC

## 2023-11-20 MED ORDER — SODIUM CHLORIDE 0.9% FLUSH: 3.0000 mL | Freq: Two times a day (BID) | INTRAVENOUS | Status: DC

## 2023-11-20 MED ORDER — CLOPIDOGREL BISULFATE 300 MG PO TABS
ORAL_TABLET | ORAL | Status: AC
  Filled 2023-11-20: qty 2

## 2023-11-20 MED ORDER — FAMOTIDINE IN NACL 20-0.9 MG/50ML-% IV SOLN
INTRAVENOUS | Status: AC
  Filled 2023-11-20: qty 50

## 2023-11-20 MED ORDER — VITAMIN B-12 500 MCG PO TABS: 500.0000 ug | ORAL_TABLET | Freq: Every day | ORAL | Status: DC

## 2023-11-20 MED ORDER — SODIUM CHLORIDE 0.9 % WEIGHT BASED INFUSION
3.0000 mL/kg/h | INTRAVENOUS | Status: AC
  Administered 2023-11-20: 3 mL/kg/h via INTRAVENOUS

## 2023-11-20 MED ORDER — SODIUM CHLORIDE 0.9 % IV SOLN: 250.0000 mL | INTRAVENOUS | Status: DC | PRN

## 2023-11-20 MED ORDER — FENTANYL CITRATE (PF) 100 MCG/2ML IJ SOLN
INTRAMUSCULAR | Status: AC
  Filled 2023-11-20: qty 2

## 2023-11-20 MED ORDER — HEPARIN (PORCINE) IN NACL 1000-0.9 UT/500ML-% IV SOLN
INTRAVENOUS | Status: DC | PRN
  Administered 2023-11-20: 500 mL

## 2023-11-20 MED ORDER — FENOFIBRATE 160 MG PO TABS: 160.0000 mg | ORAL_TABLET | Freq: Every day | ORAL | Status: DC

## 2023-11-20 MED ORDER — MAGNESIUM OXIDE 400 MG PO TABS: 400.0000 mg | ORAL_TABLET | Freq: Every day | ORAL | Status: DC

## 2023-11-20 MED ORDER — NITROGLYCERIN 0.4 MG SL SUBL: 0.4000 mg | SUBLINGUAL_TABLET | SUBLINGUAL | Status: DC | PRN

## 2023-11-20 MED ORDER — SODIUM CHLORIDE 0.9% FLUSH: 3.0000 mL | INTRAVENOUS | Status: DC | PRN

## 2023-11-20 MED ORDER — HEPARIN SODIUM (PORCINE) 1000 UNIT/ML IJ SOLN
INTRAMUSCULAR | Status: AC
  Filled 2023-11-20: qty 10

## 2023-11-20 MED ORDER — LIDOCAINE HCL (PF) 1 % IJ SOLN
INTRAMUSCULAR | Status: AC
  Filled 2023-11-20: qty 30

## 2023-11-20 MED ORDER — ACETAMINOPHEN 325 MG PO TABS: 650.0000 mg | ORAL_TABLET | ORAL | Status: DC | PRN

## 2023-11-20 MED ORDER — HEPARIN SODIUM (PORCINE) 1000 UNIT/ML IJ SOLN
INTRAMUSCULAR | Status: DC | PRN
  Administered 2023-11-20: 4500 [IU] via INTRAVENOUS
  Administered 2023-11-20: 4000 [IU] via INTRAVENOUS
  Administered 2023-11-20: 3000 [IU] via INTRAVENOUS

## 2023-11-20 MED ORDER — ASPIRIN 81 MG PO TBEC: 81.0000 mg | DELAYED_RELEASE_TABLET | Freq: Every day | ORAL | 2 refills | Status: DC

## 2023-11-20 MED ORDER — TRAZODONE HCL 50 MG PO TABS: 50.0000 mg | ORAL_TABLET | Freq: Every day | ORAL | Status: DC

## 2023-11-20 MED ORDER — ASPIRIN 81 MG PO CHEW: 81.0000 mg | CHEWABLE_TABLET | ORAL | Status: AC

## 2023-11-20 MED ORDER — ALIGN 4 MG PO CAPS: 4.0000 mg | ORAL_CAPSULE | Freq: Every day | ORAL | Status: DC

## 2023-11-20 MED ORDER — FAMOTIDINE IN NACL 20-0.9 MG/50ML-% IV SOLN
INTRAVENOUS | Status: DC | PRN
  Administered 2023-11-20: 20 mg via INTRAVENOUS

## 2023-11-20 MED ORDER — ALPRAZOLAM 0.25 MG PO TABS: 2.0000 mg | ORAL_TABLET | Freq: Every day | ORAL | Status: DC

## 2023-11-20 MED ORDER — ASPIRIN 81 MG PO TBEC
81.0000 mg | DELAYED_RELEASE_TABLET | Freq: Every day | ORAL | 0 refills | Status: DC
  Filled 2023-11-20: qty 30, 30d supply, fill #0

## 2023-11-20 MED ORDER — NITROGLYCERIN 0.4 MG SL SUBL: 0.4000 mg | SUBLINGUAL_TABLET | SUBLINGUAL | 3 refills | Status: AC | PRN

## 2023-11-20 MED ORDER — CLOPIDOGREL BISULFATE 300 MG PO TABS
ORAL_TABLET | ORAL | Status: DC | PRN
  Administered 2023-11-20: 600 mg via ORAL

## 2023-11-20 MED ORDER — IOHEXOL 350 MG/ML SOLN
INTRAVENOUS | Status: DC | PRN
  Administered 2023-11-20: 170 mL via INTRA_ARTERIAL

## 2023-11-20 MED ORDER — MIDAZOLAM HCL 2 MG/2ML IJ SOLN
INTRAMUSCULAR | Status: AC
  Filled 2023-11-20: qty 2

## 2023-11-20 MED ORDER — PANTOPRAZOLE SODIUM 40 MG PO TBEC: 40.0000 mg | DELAYED_RELEASE_TABLET | Freq: Every day | ORAL | Status: DC

## 2023-11-20 MED ORDER — HYDROCODONE-ACETAMINOPHEN 10-325 MG PO TABS: 1.0000 | ORAL_TABLET | Freq: Four times a day (QID) | ORAL | Status: DC | PRN

## 2023-11-20 MED ORDER — MIDAZOLAM HCL 2 MG/2ML IJ SOLN
INTRAMUSCULAR | Status: DC | PRN
  Administered 2023-11-20 (×2): 1 mg via INTRAVENOUS

## 2023-11-20 MED ORDER — FENTANYL CITRATE (PF) 100 MCG/2ML IJ SOLN
INTRAMUSCULAR | Status: DC | PRN
  Administered 2023-11-20 (×2): 25 ug via INTRAVENOUS

## 2023-11-20 MED ORDER — METOPROLOL SUCCINATE ER 25 MG PO TB24: 25.0000 mg | ORAL_TABLET | Freq: Every day | ORAL | Status: DC

## 2023-11-20 MED ORDER — ROSUVASTATIN CALCIUM 10 MG PO TABS: 10.0000 mg | ORAL_TABLET | Freq: Every day | ORAL | Status: DC

## 2023-11-20 MED ORDER — CLOPIDOGREL BISULFATE 75 MG PO TABS: 75.0000 mg | ORAL_TABLET | Freq: Every day | ORAL | Status: DC

## 2023-11-20 MED ORDER — CLOPIDOGREL BISULFATE 75 MG PO TABS
75.0000 mg | ORAL_TABLET | Freq: Every day | ORAL | 12 refills | Status: DC
  Filled 2023-11-20: qty 30, 30d supply, fill #0

## 2023-11-20 MED ORDER — ONDANSETRON HCL 4 MG/2ML IJ SOLN: 4.0000 mg | Freq: Four times a day (QID) | INTRAMUSCULAR | Status: DC | PRN

## 2023-11-20 MED ORDER — SODIUM CHLORIDE 0.9 % IV SOLN: INTRAVENOUS | Status: DC

## 2023-11-20 SURGICAL SUPPLY — 19 items
BALLN EMERGE MR 2.5X12 (BALLOONS) ×1
BALLN ~~LOC~~ EMERGE MR 3.25X15 (BALLOONS) ×1
BALLOON EMERGE MR 2.5X12 (BALLOONS) IMPLANT
BALLOON ~~LOC~~ EMERGE MR 3.25X15 (BALLOONS) IMPLANT
CATH INFINITI AMBI 5FR JK (CATHETERS) IMPLANT
CATH INFINITI JR4 5F (CATHETERS) IMPLANT
CATH LAUNCHER 6FR EBU3.5 (CATHETERS) IMPLANT
DEVICE RAD COMP TR BAND LRG (VASCULAR PRODUCTS) IMPLANT
GLIDESHEATH SLEND SS 6F .021 (SHEATH) IMPLANT
GUIDEWIRE INQWIRE 1.5J.035X260 (WIRE) IMPLANT
GUIDEWIRE PRESSURE X 175 (WIRE) IMPLANT
INQWIRE 1.5J .035X260CM (WIRE) ×1
KIT ENCORE 26 ADVANTAGE (KITS) IMPLANT
KIT ESSENTIALS PG (KITS) IMPLANT
PACK CARDIAC CATHETERIZATION (CUSTOM PROCEDURE TRAY) ×1 IMPLANT
SET ATX-X65L (MISCELLANEOUS) IMPLANT
STENT SYNERGY XD 3.0X24 (Permanent Stent) IMPLANT
SYNERGY XD 3.0X24 (Permanent Stent) ×1 IMPLANT
TUBING CIL FLEX 10 FLL-RA (TUBING) IMPLANT

## 2023-11-20 NOTE — Interval H&P Note (Signed)
History and Physical Interval Note:  11/20/2023 11:00 AM  Paul Arias  has presented today for surgery, with the diagnosis of Abnormal Stress Test.  The various methods of treatment have been discussed with the patient and family. After consideration of risks, benefits and other options for treatment, the patient has consented to  Procedure(s): LEFT HEART CATH AND CORONARY ANGIOGRAPHY (N/A) as a surgical intervention.  The patient's history has been reviewed, patient examined, no change in status, stable for surgery.  I have reviewed the patient's chart and labs.  Questions were answered to the patient's satisfaction.     Lorine Bears

## 2023-11-20 NOTE — Progress Notes (Signed)
CARDIAC REHAB PHASE I     Post stent education including site care, restrictions, risk factors, exercise guidelines, NTG use, antiplatelet therapy importance, heart healthy diet and CRP2 reviewed. All questions and concerns addressed. Will refer to AP for CRP2. Plan for home later today.    [id number] Woodroe Chen, RN BSN 11/20/2023 1:15 PM

## 2023-11-20 NOTE — Discharge Summary (Signed)
Discharge Summary for Same Day PCI   Patient ID: Paul Arias MRN: [id number]; DOB: 1966/12/06  Admit date: 11/20/2023 Discharge date: 11/20/2023  Primary Care Provider: Benita Stabile, MD  Primary Cardiologist: Nona Dell, MD  Primary Electrophysiologist:  None   Discharge Diagnoses    Active Problems:   CAD (coronary artery disease)  Diagnostic Studies/Procedures    Cardiac Catheterization 11/20/2023:    Ost RCA to Prox RCA lesion is 100% stenosed.   Prox LAD to Mid LAD lesion is 70% stenosed.   1st Diag lesion is 40% stenosed.   Mid Cx to Dist Cx lesion is 50% stenosed.   A drug-eluting stent was successfully placed using a SYNERGY XD 3.0X24.   Post intervention, there is a 0% residual stenosis.   The left ventricular systolic function is normal.   LV end diastolic pressure is mildly elevated.   The left ventricular ejection fraction is 55-65% by visual estimate.   1.  Significant two-vessel coronary artery disease.  The coronary arteries are moderately calcified.  Chronically occluded RCA with left-to-right collaterals.  Progression of proximal/mid LAD stenosis to 70% and was significant by fractional flow reserve evaluation with an IFR ratio of 0.88.  There is also moderate mid left circumflex stenosis. 2.  Normal LV systolic function and mildly elevated left ventricular end-diastolic pressure. 3.  Successful angioplasty and drug-eluting stent placement to the proximal/mid LAD.   Recommendations: Dual antiplatelet therapy for at least 6 months. Aggressive treatment of risk factors.  Diagnostic Dominance: Co-dominant  Intervention   _____________   History of Present Illness     Paul Arias is a 57 y.o. male with history of HLD &  CAD with chronically occluded RCA associated with left-to-right collaterals and moderate proximal to mid LAD stenosis managed medically. Follow-up Lexiscan Myoview in February 2023 was low risk showing no active  ischemia with LVEF 59%.   Patient saw Dr. Diona Browner 08/2023 with worsening DOE but no angina. Echo normal LVEF, NST mild to mod peri-infarct ischemia-Imdur added. CXR no effusion or infiltrate.  Patient said when he walked up a flight of stairs he would become short of breath and legs feel weak. 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 Can walk 1 mile flat without any problem. Has to take a deep breath. Imdur hasn't helped at all. No longer smokes. DOE going up any incline. Similar to when he had his first cath. Given his symptoms, he was set up for outpatient cardiac catheterization.   Hospital Course     The patient underwent cardiac cath as noted above with significant 2v CAD, successful PCI/DES to the p/mLAD in the setting of IFR of 0.88. CTO of RCA with left to right collaterals. Plan for DAPT with ASA/plavix for at least 6 months. The patient was seen by cardiac rehab while in short stay. There were no observed complications post cath. Radial cath site was re-evaluated prior to discharge and found to be stable without any complications. Instructions/precautions regarding cath site care were given prior to discharge.  Paul Arias was seen by Dr. Kirke Corin and determined stable for discharge home. Follow up with our office has been arranged. Medications are listed below. Pertinent changes include addition of ASA, plavix. _____________  Cath/PCI Registry Performance & Quality Measures: Aspirin prescribed? - Yes ADP Receptor Inhibitor (Plavix/Clopidogrel, Brilinta/Ticagrelor or Effient/Prasugrel) prescribed (includes medically managed patients)? - Yes High Intensity Statin (Lipitor 40-80mg  or Crestor 20-40mg ) prescribed? - Yes For EF <40%,  was ACEI/ARB prescribed? - Not Applicable (EF >/= 40%) For EF <40%, Aldosterone Antagonist (Spironolactone or Eplerenone) prescribed? - Not Applicable (EF >/= 40%) Cardiac Rehab Phase II ordered (Included Medically managed  Patients)? - Yes  _____________   Discharge Vitals Blood pressure (!) 142/94, pulse 61, temperature 98.3 F (36.8 C), temperature source Oral, resp. rate 13, height 6\' 3"  (1.905 m), weight 93 kg, SpO2 97%.  Filed Weights   11/20/23 1024  Weight: 93 kg    Last Labs & Radiologic Studies    CBC No results for input(s): "WBC", "NEUTROABS", "HGB", "HCT", "MCV", "PLT" in the last 72 hours. Basic Metabolic Panel No results for input(s): "NA", "K", "CL", "CO2", "GLUCOSE", "BUN", "CREATININE", "CALCIUM", "MG", "PHOS" in the last 72 hours. Liver Function Tests No results for input(s): "AST", "ALT", "ALKPHOS", "BILITOT", "PROT", "ALBUMIN" in the last 72 hours. No results for input(s): "LIPASE", "AMYLASE" in the last 72 hours. High Sensitivity Troponin:   No results for input(s): "TROPONINIHS" in the last 720 hours.  BNP Invalid input(s): "POCBNP" D-Dimer No results for input(s): "DDIMER" in the last 72 hours. Hemoglobin A1C No results for input(s): "HGBA1C" in the last 72 hours. Fasting Lipid Panel No results for input(s): "CHOL", "HDL", "LDLCALC", "TRIG", "CHOLHDL", "LDLDIRECT" in the last 72 hours. Thyroid Function Tests No results for input(s): "TSH", "T4TOTAL", "T3FREE", "THYROIDAB" in the last 72 hours.  Invalid input(s): "FREET3" _____________  CARDIAC CATHETERIZATION Result Date: 11/20/2023   Ost RCA to Prox RCA lesion is 100% stenosed.   Prox LAD to Mid LAD lesion is 70% stenosed.   1st Diag lesion is 40% stenosed.   Mid Cx to Dist Cx lesion is 50% stenosed.   A drug-eluting stent was successfully placed using a SYNERGY XD 3.0X24.   Post intervention, there is a 0% residual stenosis.   The left ventricular systolic function is normal.   LV end diastolic pressure is mildly elevated.   The left ventricular ejection fraction is 55-65% by visual estimate. 1.  Significant two-vessel coronary artery disease.  The coronary arteries are moderately calcified.  Chronically occluded RCA with  left-to-right collaterals.  Progression of proximal/mid LAD stenosis to 70% and was significant by fractional flow reserve evaluation with an IFR ratio of 0.88.  There is also moderate mid left circumflex stenosis. 2.  Normal LV systolic function and mildly elevated left ventricular end-diastolic pressure. 3.  Successful angioplasty and drug-eluting stent placement to the proximal/mid LAD. Recommendations: Dual antiplatelet therapy for at least 6 months. Aggressive treatment of risk factors.    Disposition   Pt is being discharged home today in good condition.  Follow-up Plans & Appointments     Discharge Instructions     Amb Referral to Cardiac Rehabilitation   Complete by: As directed    Diagnosis: Coronary Stents   After initial evaluation and assessments completed: Virtual Based Care may be provided alone or in conjunction with Phase 2 Cardiac Rehab based on patient barriers.: Yes   Intensive Cardiac Rehabilitation (ICR) [facility] location only OR Traditional Cardiac Rehabilitation (TCR) *If criteria for ICR are not met will enroll in TCR [facility] only): Yes        Discharge Medications   Allergies as of 11/20/2023   No Known Allergies      Medication List     TAKE these medications    Align 4 MG Caps Take 4 mg by mouth daily.   ALPRAZolam 1 MG tablet Commonly known as: XANAX Take 2 mg by mouth at bedtime.  aspirin EC 81 MG tablet Take 1 tablet (81 mg total) by mouth daily. Swallow whole.   aspirin-sod bicarb-citric acid 325 MG Tbef tablet Commonly known as: ALKA-SELTZER Take 650 mg by mouth every 6 (six) hours as needed (indigestion).   Biotin 800 MCG Tabs Take by mouth daily.   clopidogrel 75 MG tablet Commonly known as: Plavix Take 1 tablet (75 mg total) by mouth daily.   fenofibrate 160 MG tablet Take 160 mg by mouth at bedtime.   HYDROcodone-acetaminophen 10-325 MG tablet Commonly known as: NORCO Take 1 tablet by mouth every 6 (six) hours as needed (for  pain).   isosorbide mononitrate 30 MG 24 hr tablet Commonly known as: IMDUR Take 0.5 tablets (15 mg total) by mouth at bedtime. For 1 week, then increase to 30 mg daily at bedtime What changed:  how much to take additional instructions   magnesium oxide 400 MG tablet Commonly known as: MAG-OX Take 400 mg by mouth daily.   metoprolol succinate 25 MG 24 hr tablet Commonly known as: TOPROL-XL TAKE 1 TABLET BY MOUTH ONCE DAILY.   nitroGLYCERIN 0.4 MG SL tablet Commonly known as: Nitrostat Place 1 tablet (0.4 mg total) under the tongue every 5 (five) minutes as needed for chest pain.   pantoprazole 40 MG tablet Commonly known as: PROTONIX Take 40 mg by mouth daily.   Repatha SureClick 140 MG/ML Soaj Generic drug: Evolocumab Inject 1 Dose into the skin every 14 (fourteen) days.   rosuvastatin 10 MG tablet Commonly known as: CRESTOR Take 1 tablet (10 mg total) by mouth daily.   traZODone 50 MG tablet Commonly known as: DESYREL Take 50 mg by mouth at bedtime.   vitamin B-12 500 MCG tablet Commonly known as: CYANOCOBALAMIN Take 500 mcg by mouth daily.   VITAMIN K2-VITAMIN D3 PO Take 1 capsule by mouth daily.   zinc gluconate 50 MG tablet Take 50 mg by mouth daily.           Allergies No Known Allergies  Outstanding Labs/Studies   N/a   Duration of Discharge Encounter   Greater than 30 minutes including physician time.  Signed, Laverda Page, NP 11/20/2023, 1:35 PM

## 2023-11-20 NOTE — Discharge Instructions (Signed)
Radial Site Care  This sheet gives you information about how to care for yourself after your procedure. Your health care provider may also give you more specific instructions. If you have problems or questions, contact your health care provider. What can I expect after the procedure? After the procedure, it is common to have: Bruising and tenderness at the catheter insertion area. Follow these instructions at home: Medicines Take over-the-counter and prescription medicines only as told by your health care provider. Insertion site care Follow instructions from your health care provider about how to take care of your insertion site. Make sure you: Wash your hands with soap and water before you remove your bandage (dressing). If soap and water are not available, use hand sanitizer. May remove dressing in 24 hours. Check your insertion site every day for signs of infection. Check for: Redness, swelling, or pain. Fluid or blood. Pus or a bad smell. Warmth. Do no take baths, swim, or use a hot tub for 5 days. You may shower 24-48 hours after the procedure. Remove the dressing and gently wash the site with plain soap and water. Pat the area dry with a clean towel. Do not rub the site. That could cause bleeding. Do not apply powder or lotion to the site. Activity  For 24 hours after the procedure, or as directed by your health care provider: Do not flex or bend the affected arm. Do not push or pull heavy objects with the affected arm. Do not drive yourself home from the hospital or clinic. You may drive 24 hours after the procedure. Do not operate machinery or power tools. KEEP ARM ELEVATED THE REMAINDER OF THE DAY. Do not push, pull or lift anything that is heavier than 10 lb for 5 days. Ask your health care provider when it is okay to: Return to work or school. Resume usual physical activities or sports. Resume sexual activity. General instructions If the catheter site starts to  bleed, raise your arm and put firm pressure on the site. If the bleeding does not stop, get help right away. This is a medical emergency. DRINK PLENTY OF FLUIDS FOR THE NEXT 2-3 DAYS. No alcohol consumption for 24 hours after receiving sedation. If you went home on the same day as your procedure, a responsible adult should be with you for the first 24 hours after you arrive home. Keep all follow-up visits as told by your health care provider. This is important. Contact a health care provider if: You have a fever. You have redness, swelling, or yellow drainage around your insertion site. Get help right away if: You have unusual pain at the radial site. The catheter insertion area swells very fast. The insertion area is bleeding, and the bleeding does not stop when you hold steady pressure on the area. Your arm or hand becomes pale, cool, tingly, or numb. These symptoms may represent a serious problem that is an emergency. Do not wait to see if the symptoms will go away. Get medical help right away. Call your local emergency services (911 in the [place] Do not drive yourself to the hospital. Summary After the procedure, it is common to have bruising and tenderness at the site. Follow instructions from your health care provider about how to take care of your radial site wound. Check the wound every day for signs of infection.  This information is not intended to replace advice given to you by your health care provider. Make sure you discuss any questions you have with   your health care provider. Document Revised: 12/30/2017 Document Reviewed: 12/30/2017 Elsevier Patient Education  2020 [employer].  

## 2023-11-23 ENCOUNTER — Encounter (HOSPITAL_COMMUNITY): Payer: Self-pay | Admitting: Cardiovascular Disease

## 2023-11-24 ENCOUNTER — Encounter: Payer: Self-pay | Admitting: Gastroenterology

## 2023-11-24 MED FILL — Nitroglycerin IV Soln 100 MCG/ML in D5W: INTRA_ARTERIAL | Qty: 10 | Status: AC

## 2023-11-25 ENCOUNTER — Telehealth: Payer: Self-pay | Admitting: Cardiovascular Disease

## 2023-11-25 ENCOUNTER — Encounter: Payer: Self-pay | Admitting: Cardiovascular Disease

## 2023-11-25 NOTE — Telephone Encounter (Signed)
Faxed to [facility] in [place]

## 2023-11-25 NOTE — Telephone Encounter (Signed)
LVM for patient to fill out forms & pay the $29, paperwork at front desk.

## 2023-11-25 NOTE — Telephone Encounter (Signed)
Pt called in and would like to know if this paperwork could be faxed Paul Arias to sign and pay so he does not have to drive to [place]?    Best number [phone]

## 2023-11-26 ENCOUNTER — Telehealth: Payer: Self-pay | Admitting: Cardiology

## 2023-11-26 DIAGNOSIS — Z0279 Encounter for issue of other medical certificate: Secondary | ICD-10-CM

## 2023-11-26 NOTE — Telephone Encounter (Signed)
Pt called in about FMLA paperwork. Informed him it was faxed over yesterday to [employer]. Nurse will reach out once its been signed.

## 2023-11-26 NOTE — Telephone Encounter (Signed)
Form received from [employer] on 11/26/2023. Pt completed authorization form and billing form, both uploaded to chart documents. Blank form scanned into documents as well. Billing form faxed to billing department. Form given to Dr. Diona Browner for completion.   AGA-11/26/23

## 2023-11-30 NOTE — Telephone Encounter (Signed)
Forms have been completed by SM. Faxed the forms to [phone] and LVM with Casimiro Needle that they were completed and, Sent.  11/30/23 MH

## 2023-11-30 NOTE — Progress Notes (Signed)
 Cardiology Office Note:  .   Date:  12/14/2023  ID:  Paul Arias, DOB 1966/11/07, MRN [id number] PCP: Shona Norleen PEDLAR, MD  [facility] Providers Cardiologist:  Jayson Sierras, MD    History of Present Illness: Paul Arias   Paul Arias is a 57 y.o. male  with history of HLD &  CAD with chronically occluded RCA associated with left-to-right collaterals and moderate proximal to mid LAD stenosis managed medically. Follow-up Lexiscan  Myoview  in February 2023 was low risk showing no active ischemia with LVEF 59%.  Patient had worsening DOE and underwent cardiac cath as noted above with significant 2v CAD, successful PCI/DES to the p/mLAD in the setting of IFR of 0.88. CTO of RCA with left to right collaterals. Plan for DAPT with ASA/plavix  for at least 6 months.  Patient comes in for f/u. Trying to change his diet, no more fast food.Long discussion about diet and exercise. DOE has improved-can walk up and down stairs without a problem but still has some DOE. Former smoker 1/2 ppd for 20-30 yrs. Would referred to pulmonary. CXR no COPD, hyperinflation. Asking about his elevated triglycerides but we don't have updated FLP. He hasn't been using his repatha  because his cholesterol got too low-LDL down to 2. For repeat with Dr. Shona this week.    ROS:    Studies Reviewed: Paul Arias         Prior CV Studies: LEFT HEART CATH AND CORONARY ANGIOGRAPHY, LEFT HEART CATH AND CORONARY ANGIOGRAPHY, LEFT HEART CATH AND CORONARY ANGIOGRAPHY 11/20/2023  Narrative   Ost RCA to Prox RCA lesion is 100% stenosed.   Prox LAD to Mid LAD lesion is 70% stenosed.   1st Diag lesion is 40% stenosed.   Mid Cx to Dist Cx lesion is 50% stenosed.   A drug-eluting stent was successfully placed using a SYNERGY XD 3.0X24.   Post intervention, there is a 0% residual stenosis.   The left ventricular systolic function is normal.   LV end diastolic pressure is mildly elevated.   The left ventricular ejection fraction is 55-65%  by visual estimate.  1.  Significant two-vessel coronary artery disease.  The coronary arteries are moderately calcified.  Chronically occluded RCA with left-to-right collaterals.  Progression of proximal/mid LAD stenosis to 70% and was significant by fractional flow reserve evaluation with an IFR ratio of 0.88.  There is also moderate mid left circumflex stenosis. 2.  Normal LV systolic function and mildly elevated left ventricular end-diastolic pressure. 3.  Successful angioplasty and drug-eluting stent placement to the proximal/mid LAD.  Recommendations: Dual antiplatelet therapy for at least 6 months. Aggressive treatment of risk factors.       Risk Assessment/Calculations:             Physical Exam:   VS:  BP 136/78   Pulse 66   Ht 6' 3 (1.905 m)   Wt 213 lb 12.8 oz (97 kg)   SpO2 95%   BMI 26.72 kg/m    Wt Readings from Last 3 Encounters:  12/14/23 213 lb 12.8 oz (97 kg)  11/20/23 205 lb (93 kg)  11/16/23 206 lb 6.4 oz (93.6 kg)    GEN: Well nourished, well developed in no acute distress NECK: No JVD; No carotid bruits CARDIAC:  RRR, no murmurs, rubs, gallops RESPIRATORY:  Clear to auscultation without rales, wheezing or rhonchi  ABDOMEN: Soft, non-tender, non-distended EXTREMITIES:  No edema; No deformity, cath site without hematoma or hemorrhage, good radial and brachial pulse  ASSESSMENT AND PLAN: .    CAD DES p/mLAD, CTO RCA with left to right collaterals on ASA & Plavix . DOE has improved and no chest pain.   HLD on Repatha  and crestor (reduced to 10 mg LOV) but he stopped repatha  on his own. I asked him to restart. To have labs this week by Dr. Shona.    HTN-BP controlled today. Has cut back on fast food. 2 gm sodium diet discussed-still eating frozen meals and canned soup. Mediterranean diet  Chronic DOE and former smoker. Would like referral to Pulmonary. Will place.        Dispo: f/u in 2-3 months  Signed, Olivia Pavy, PA-C

## 2023-12-14 ENCOUNTER — Encounter: Payer: Self-pay | Admitting: Physician Assistant

## 2023-12-14 ENCOUNTER — Ambulatory Visit: Payer: 59 | Attending: Physician Assistant | Admitting: Physician Assistant

## 2023-12-14 VITALS — BP 136/78 | HR 66 | Ht 75.0 in | Wt 213.8 lb

## 2023-12-14 DIAGNOSIS — E785 Hyperlipidemia, unspecified: Secondary | ICD-10-CM

## 2023-12-14 DIAGNOSIS — I2510 Atherosclerotic heart disease of native coronary artery without angina pectoris: Secondary | ICD-10-CM | POA: Diagnosis not present

## 2023-12-14 DIAGNOSIS — Z87891 Personal history of nicotine dependence: Secondary | ICD-10-CM | POA: Diagnosis not present

## 2023-12-14 DIAGNOSIS — I10 Essential (primary) hypertension: Secondary | ICD-10-CM | POA: Diagnosis not present

## 2023-12-14 DIAGNOSIS — I2583 Coronary atherosclerosis due to lipid rich plaque: Secondary | ICD-10-CM

## 2023-12-14 DIAGNOSIS — R0609 Other forms of dyspnea: Secondary | ICD-10-CM

## 2023-12-14 NOTE — Patient Instructions (Signed)
 Medication Instructions:  Your physician recommends that you continue on your current medications as directed. Please refer to the Current Medication list given to you today.  *If you need a refill on your cardiac medications before your next appointment, please call your pharmacy*   Lab Work: NONE   If you have labs (blood work) drawn today and your tests are completely normal, you will receive your results only by: MyChart Message (if you have MyChart) OR A paper copy in the mail If you have any lab test that is abnormal or we need to change your treatment, we will call you to review the results.   Testing/Procedures: NONE    Follow-Up: At [facility], you and your health needs are our priority.  As part of our continuing mission to provide you with exceptional heart care, we have created designated Provider Care Teams.  These Care Teams include your primary Cardiologist (physician) and Advanced Practice Providers (APPs -  Physician Assistants and Nurse Practitioners) who all work together to provide you with the care you need, when you need it.  We recommend signing up for the patient portal called MyChart.  Sign up information is provided on this After Visit Summary.  MyChart is used to connect with patients for Virtual Visits (Telemedicine).  Patients are able to view lab/test results, encounter notes, upcoming appointments, etc.  Non-urgent messages can be sent to your provider as well.   To learn more about what you can do with MyChart, go to [email or web address].    Your next appointment:   2 -3 month(s)  Provider:   Jayson Sierras, MD    Other Instructions Thank you for choosing [facility]!    Heart-Healthy Eating Plan Many factors influence your heart health, including eating and exercise habits. Heart health is also called coronary health. Coronary risk increases with abnormal blood fat (lipid) levels. A heart-healthy eating plan includes  limiting unhealthy fats, increasing healthy fats, limiting salt (sodium) intake, and making other diet and lifestyle changes. What is my plan? Your health care provider may recommend that: You limit your fat intake to _________% or less of your total calories each day. You limit your saturated fat intake to _________% or less of your total calories each day. You limit the amount of cholesterol in your diet to less than _________ mg per day. You limit the amount of sodium in your diet to less than _________ mg per day. What are tips for following this plan? Cooking Cook foods using methods other than frying. Baking, boiling, grilling, and broiling are all good options. Other ways to reduce fat include: Removing the skin from poultry. Removing all visible fats from meats. Steaming vegetables in water  or broth. Meal planning  At meals, imagine dividing your plate into fourths: Fill one-half of your plate with vegetables and green salads. Fill one-fourth of your plate with whole grains. Fill one-fourth of your plate with lean protein foods. Eat 2-4 cups of vegetables per day. One cup of vegetables equals 1 cup (91 g) broccoli or cauliflower florets, 2 medium carrots, 1 large bell pepper, 1 large sweet potato, 1 large tomato, 1 medium white potato, 2 cups (150 g) raw leafy greens. Eat 1-2 cups of fruit per day. One cup of fruit equals 1 small apple, 1 large banana, 1 cup (237 g) mixed fruit, 1 large orange,  cup (82 g) dried fruit, 1 cup (240 mL) 100% fruit juice. Eat more foods that contain soluble fiber. Examples include apples,  broccoli, carrots, beans, peas, and barley. Aim to get 25-30 g of fiber per day. Increase your consumption of legumes, nuts, and seeds to 4-5 servings per week. One serving of dried beans or legumes equals  cup (90 g) cooked, 1 serving of nuts is  oz (12 almonds, 24 pistachios, or 7 walnut halves), and 1 serving of seeds equals  oz (8 g). Fats Choose healthy fats  more often. Choose monounsaturated and polyunsaturated fats, such as olive and canola oils, avocado oil, flaxseeds, walnuts, almonds, and seeds. Eat more omega-3 fats. Choose salmon, mackerel, sardines, tuna, flaxseed oil, and ground flaxseeds. Aim to eat fish at least 2 times each week. Check food labels carefully to identify foods with trans fats or high amounts of saturated fat. Limit saturated fats. These are found in animal products, such as meats, butter, and cream. Plant sources of saturated fats include palm oil, palm kernel oil, and coconut oil. Avoid foods with partially hydrogenated oils in them. These contain trans fats. Examples are stick margarine, some tub margarines, cookies, crackers, and other baked goods. Avoid fried foods. General information Eat more home-cooked food and less restaurant, buffet, and fast food. Limit or avoid alcohol. Limit foods that are high in added sugar and simple starches such as foods made using white refined flour (white breads, pastries, sweets). Lose weight if you are overweight. Losing just 5-10% of your body weight can help your overall health and prevent diseases such as diabetes and heart disease. Monitor your sodium intake, especially if you have high blood pressure. Talk with your health care provider about your sodium intake. Try to incorporate more vegetarian meals weekly. What foods should I eat? Fruits All fresh, canned (in natural juice), or frozen fruits. Vegetables Fresh or frozen vegetables (raw, steamed, roasted, or grilled). Green salads. Grains Most grains. Choose whole wheat and whole grains most of the time. Rice and pasta, including brown rice and pastas made with whole wheat. Meats and other proteins Lean, well-trimmed beef, veal, pork, and lamb. Chicken and turkey without skin. All fish and shellfish. Wild duck, rabbit, pheasant, and venison. Egg whites or low-cholesterol egg substitutes. Dried beans, peas, lentils, and tofu.  Seeds and most nuts. Dairy Low-fat or nonfat cheeses, including ricotta and mozzarella. Skim or 1% milk (liquid, powdered, or evaporated). Buttermilk made with low-fat milk. Nonfat or low-fat yogurt. Fats and oils Non-hydrogenated (trans-free) margarines. Vegetable oils, including soybean, sesame, sunflower, olive, avocado, peanut, safflower, corn, canola, and cottonseed. Salad dressings or mayonnaise made with a vegetable oil. Beverages Water  (mineral or sparkling). Coffee and tea. Unsweetened ice tea. Diet beverages. Sweets and desserts Sherbet, gelatin, and fruit ice. Small amounts of dark chocolate. Limit all sweets and desserts. Seasonings and condiments All seasonings and condiments. The items listed above may not be a complete list of foods and beverages you can eat. Contact a dietitian for more options. What foods should I avoid? Fruits Canned fruit in heavy syrup. Fruit in cream or butter sauce. Fried fruit. Limit coconut. Vegetables Vegetables cooked in cheese, cream, or butter sauce. Fried vegetables. Grains Breads made with saturated or trans fats, oils, or whole milk. Croissants. Sweet rolls. Donuts. High-fat crackers, such as cheese crackers and chips. Meats and other proteins Fatty meats, such as hot dogs, ribs, sausage, bacon, rib-eye roast or steak. High-fat deli meats, such as salami and bologna. Caviar. Domestic duck and goose. Organ meats, such as liver. Dairy Cream, sour cream, cream cheese, and creamed cottage cheese. Whole-milk cheeses. Whole or 2%  milk (liquid, evaporated, or condensed). Whole buttermilk. Cream sauce or high-fat cheese sauce. Whole-milk yogurt. Fats and oils Meat fat, or shortening. Cocoa butter, hydrogenated oils, palm oil, coconut oil, palm kernel oil. Solid fats and shortenings, including bacon fat, salt pork, lard, and butter. Nondairy cream substitutes. Salad dressings with cheese or sour cream. Beverages Regular sodas and any drinks with added  sugar. Sweets and desserts Frosting. Pudding. Cookies. Cakes. Pies. Milk chocolate or white chocolate. Buttered syrups. Full-fat ice cream or ice cream drinks. The items listed above may not be a complete list of foods and beverages to avoid. Contact a dietitian for more information. Summary Heart-healthy meal planning includes limiting unhealthy fats, increasing healthy fats, limiting salt (sodium) intake and making other diet and lifestyle changes. Lose weight if you are overweight. Losing just 5-10% of your body weight can help your overall health and prevent diseases such as diabetes and heart disease. Focus on eating a balance of foods, including fruits and vegetables, low-fat or nonfat dairy, lean protein, nuts and legumes, whole grains, and heart-healthy oils and fats. This information is not intended to replace advice given to you by your health care provider. Make sure you discuss any questions you have with your health care provider. Document Revised: 12/30/2021 Document Reviewed: 12/30/2021 Elsevier Patient Education  2024 [employer].

## 2023-12-16 ENCOUNTER — Emergency Department (HOSPITAL_COMMUNITY)
Admission: EM | Admit: 2023-12-16 | Discharge: 2023-12-16 | Disposition: A | Discharge status: Home / Self Care | Payer: 59 | Attending: Student | Admitting: Student

## 2023-12-16 ENCOUNTER — Encounter (HOSPITAL_COMMUNITY): Payer: Self-pay

## 2023-12-16 ENCOUNTER — Other Ambulatory Visit: Payer: Self-pay

## 2023-12-16 DIAGNOSIS — I2510 Atherosclerotic heart disease of native coronary artery without angina pectoris: Secondary | ICD-10-CM | POA: Diagnosis not present

## 2023-12-16 DIAGNOSIS — I10 Essential (primary) hypertension: Secondary | ICD-10-CM | POA: Diagnosis not present

## 2023-12-16 DIAGNOSIS — Z7982 Long term (current) use of aspirin: Secondary | ICD-10-CM | POA: Diagnosis not present

## 2023-12-16 DIAGNOSIS — R040 Epistaxis: Secondary | ICD-10-CM | POA: Insufficient documentation

## 2023-12-16 DIAGNOSIS — Z79899 Other long term (current) drug therapy: Secondary | ICD-10-CM | POA: Insufficient documentation

## 2023-12-16 DIAGNOSIS — Z7902 Long term (current) use of antithrombotics/antiplatelets: Secondary | ICD-10-CM | POA: Insufficient documentation

## 2023-12-16 MED ORDER — OXYMETAZOLINE HCL 0.05 % NA SOLN
1.0000 | Freq: Once | NASAL | Status: AC
  Administered 2023-12-16: 1 via NASAL
  Filled 2023-12-16: qty 30

## 2023-12-16 NOTE — ED Triage Notes (Signed)
 Pt c/o nose bleed for an hour or more. No bleeding at this time in triage. Pt states he is on a blood thinner but can't remember the name. Pt denies any trauma to his nose.

## 2023-12-16 NOTE — ED Provider Notes (Signed)
 [place] [facility] AT [facility] Provider Note   CSN: [id number] Arrival date & time: 12/16/23  1309     History  Chief Complaint  Patient presents with   Epistaxis    Kingsley ELIZER BOSTIC is a 58 y.o. male.  Patient with history of CAD, hypertension on Plavix  and aspirin  presents today with complaints of epistaxis.  He states that same began this morning when he was at his cardiologist office for a routine scheduled appointment.  He states that after a few minutes of monitoring, he could not get the bleeding to stop and therefore his cardiologist told him he had to come here to be evaluated and have his nosebleed management.  He presents for same.  He denies any pain.  His bleeding stopped soon after he got here and has not restarted.  Bleeding was coming exclusively from the right nare.   The history is provided by the patient. No language interpreter was used.  Epistaxis      Home Medications Prior to Admission medications   Medication Sig Start Date End Date Taking? Authorizing Provider  ALPRAZolam  (XANAX ) 1 MG tablet Take 2 mg by mouth at bedtime.  07/25/16   [provider]  aspirin  EC 81 MG tablet Take 1 tablet (81 mg total) by mouth daily. Swallow whole. 11/20/23   Henry Manuelita NOVAK, NP  aspirin -sod bicarb-citric acid (ALKA-SELTZER) 325 MG TBEF tablet Take 650 mg by mouth every 6 (six) hours as needed (indigestion). Patient not taking: Reported on 12/14/2023    [provider]  Biotin  800 MCG TABS Take by mouth daily.    [provider]  clopidogrel  (PLAVIX ) 75 MG tablet Take 1 tablet (75 mg total) by mouth daily. 11/20/23   Henry Manuelita NOVAK, NP  Evolocumab  (REPATHA  SURECLICK) 140 MG/ML SOAJ Inject 1 Dose into the skin every 14 (fourteen) days. 05/08/23   Debera Jayson MATSU, MD  fenofibrate  160 MG tablet Take 160 mg by mouth at bedtime.     [provider]  HYDROcodone -acetaminophen  (NORCO) 10-325 MG tablet Take 1  tablet by mouth every 6 (six) hours as needed (for pain). 07/11/16   [provider]  isosorbide  mononitrate (IMDUR ) 30 MG 24 hr tablet Take 0.5 tablets (15 mg total) by mouth at bedtime. For 1 week, then increase to 30 mg daily at bedtime Patient taking differently: Take 30 mg by mouth at bedtime. 09/21/23   Debera Jayson MATSU, MD  magnesium  oxide (MAG-OX) 400 MG tablet Take 400 mg by mouth daily.    [provider]  metoprolol  succinate (TOPROL -XL) 25 MG 24 hr tablet TAKE 1 TABLET BY MOUTH ONCE DAILY. 11/09/23   Debera Jayson MATSU, MD  nitroGLYCERIN  (NITROSTAT ) 0.4 MG SL tablet Place 1 tablet (0.4 mg total) under the tongue every 5 (five) minutes as needed for chest pain. 11/20/23   Henry Manuelita NOVAK, NP  pantoprazole  (PROTONIX ) 40 MG tablet Take 40 mg by mouth daily. 10/02/23   [provider]  Probiotic Product (ALIGN) 4 MG CAPS Take 4 mg by mouth daily.    [provider]  rosuvastatin  (CRESTOR ) 10 MG tablet Take 1 tablet (10 mg total) by mouth daily. 09/03/23   Debera Jayson MATSU, MD  traZODone  (DESYREL ) 50 MG tablet Take 50 mg by mouth at bedtime. 12/18/22   [provider]  vitamin B-12 (CYANOCOBALAMIN ) 500 MCG tablet Take 500 mcg by mouth daily.    [provider]  Vitamin D-Vitamin K (VITAMIN K2-VITAMIN D3 PO)  Take 1 capsule by mouth daily.    [provider]  zinc  gluconate 50 MG tablet Take 50 mg by mouth daily.    [provider]      Allergies    Patient has no known allergies.    Review of Systems   Review of Systems  HENT:  Positive for nosebleeds.   All other systems reviewed and are negative.   Physical Exam Updated Vital Signs BP 118/79   Pulse 72   Temp 98.9 F (37.2 C) (Oral)   Resp 17   Ht 6' 3 (1.905 m)   Wt 96.9 kg   SpO2 92%   BMI 26.70 kg/m  Physical Exam Vitals and nursing note reviewed.  Constitutional:      General: He is not in acute distress.    Appearance: Normal appearance. He  is normal weight. He is not ill-appearing, toxic-appearing or diaphoretic.  HENT:     Head: Normocephalic and atraumatic.     Nose:     Comments: Dried blood present within the right anterior nare and Kiesselbach's plexus. No active bleeding within the nare or oropharynx  Cardiovascular:     Rate and Rhythm: Normal rate.  Pulmonary:     Effort: Pulmonary effort is normal. No respiratory distress.  Musculoskeletal:        General: Normal range of motion.     Cervical back: Normal range of motion.  Skin:    General: Skin is warm and dry.  Neurological:     General: No focal deficit present.     Mental Status: He is alert.  Psychiatric:        Mood and Affect: Mood normal.        Behavior: Behavior normal.     ED Results / Procedures / Treatments   Labs (all labs ordered are listed, but only abnormal results are displayed) Labs Reviewed - No data to display  EKG None  Radiology No results found.  Procedures Procedures    Medications Ordered in ED Medications  oxymetazoline  (AFRIN) 0.05 % nasal spray 1 spray (1 spray Each Nare Given 12/16/23 1543)    ED Course/ Medical Decision Making/ A&P                                 Medical Decision Making Risk OTC drugs.   Patient presents today with complaints of epistaxis. He is afebrile, non-toxic appearing, and in no acute distress with reassuring vital sign.  Physical exam reveals dried blood located in the right anterior nare without active bleeding within the nare or oropharynx.  Left nare is normal.  I did offer cautery with silver nitrate for epistaxis management, patient does not want this procedure at this time.  Will give Afrin spray for symptomatic management. Instructions given on how to use this medication and to only use for 3 straight days given risk of rebound congestion. Evaluation and diagnostic testing in the emergency department does not suggest an emergent condition requiring admission or immediate  intervention beyond what has been performed at this time.  Plan for discharge with close PCP follow-up.  Patient is understanding and amenable with plan, educated on red flag symptoms that would prompt immediate return.  Patient discharged in stable condition.  Final Clinical Impression(s) / ED Diagnoses Final diagnoses:  Right-sided epistaxis    Rx / DC Orders ED Discharge Orders     None  An After Visit Summary was printed and given to the patient.     Genetta Fiero A, PA-C 12/16/23 1625    Albertina Dixon, MD 12/16/23 1750

## 2023-12-16 NOTE — Discharge Instructions (Signed)
 As we discussed, we have given you Afrin spray to use as prescribed as needed for any residual nosebleeds.  If you do have a nosebleed, I recommend that you blow your nose and then spray a few pumps of the Afrin into your nose and then hold pressure for several minutes while leaning forward.  You can also use ice over the area to help as well.  Do not use the spray more than 3 consecutive days due to the risk of rebound congestion that we talked about.  If your nosebleeds continue, I recommend following up with your primary doctor to discuss definitive management.  Please follow-up with your cardiologist to discuss your medications.  Return if development of any new or worsening symptoms.

## 2023-12-24 ENCOUNTER — Ambulatory Visit: Payer: 59 | Admitting: Nurse Practitioner

## 2023-12-24 DIAGNOSIS — I209 Angina pectoris, unspecified: Secondary | ICD-10-CM | POA: Insufficient documentation

## 2023-12-24 DIAGNOSIS — I200 Unstable angina: Secondary | ICD-10-CM | POA: Insufficient documentation

## 2024-01-01 ENCOUNTER — Telehealth: Payer: Self-pay

## 2024-01-01 ENCOUNTER — Other Ambulatory Visit (HOSPITAL_COMMUNITY): Payer: Self-pay

## 2024-01-01 NOTE — Telephone Encounter (Signed)
Pharmacy Patient Advocate Encounter   Received notification from CoverMyMeds that prior authorization for PLAVIX is required/requested.   Insurance verification completed.   The patient is insured through  [facility]  .   Per test claim: Refill too soon. PA is not needed at this time. Medication was filled 12/21/23. Next eligible fill date is 01/13/24.

## 2024-01-12 ENCOUNTER — Encounter: Payer: Self-pay | Admitting: Internal Medicine

## 2024-01-12 DIAGNOSIS — J302 Other seasonal allergic rhinitis: Secondary | ICD-10-CM | POA: Insufficient documentation

## 2024-01-12 DIAGNOSIS — E559 Vitamin D deficiency, unspecified: Secondary | ICD-10-CM | POA: Insufficient documentation

## 2024-01-13 ENCOUNTER — Encounter: Payer: Self-pay | Admitting: Internal Medicine

## 2024-01-13 ENCOUNTER — Ambulatory Visit (INDEPENDENT_AMBULATORY_CARE_PROVIDER_SITE_OTHER): Payer: 59 | Admitting: Internal Medicine

## 2024-01-13 VITALS — BP 153/81 | HR 78 | Ht 75.0 in | Wt 203.4 lb

## 2024-01-13 DIAGNOSIS — R0609 Other forms of dyspnea: Secondary | ICD-10-CM | POA: Insufficient documentation

## 2024-01-13 NOTE — Assessment & Plan Note (Addendum)
 Quit smoking ? Around early 2000s (inconsistent hx) - onset around 2022 assoc with severe rhinitis since spring 2024 and overt GERD - 01/13/2024   Walked on RA  x  3  lap(s) =  approx 450  ft  @ rapid pace, stopped due to end of study s sob/ cp  with lowest 02 sats 96%    Symptoms are markedly disproportionate to objective findings and not clear to what extent this is actually a pulmonary  problem but pt does appear to have difficult to sort out respiratory symptoms of unknown origin for which  DDX  = almost all start with A and  include Adherence, Ace Inhibitors, Acid Reflux, Active Sinus Disease, Alpha 1 Antitripsin deficiency, Anxiety masquerading as Airways dz,  ABPA,  Allergy(esp in young), Aspiration (esp in elderly), Adverse effects of meds,  Active smoking or Vaping, A bunch of PE's/clot burden (a few small clots can't cause this syndrome unless there is already severe underlying pulm or vascular dz with poor reserve),  Anemia or thyroid disorder, plus two Bs  = Bronchiectasis and Beta blocker use..and one C= CHF    BOLDED dx's have not been excluded at this point with the leading suspects sinusitis/rhinitis and GERD.  He has nothing that sounds like asthma to me but does have emphysema on CT and PFTs have been ordered to assess this but I think this is not a major component to his sense of sob, esp when he says it is much better when he's on vacation away from present home and occupation   >>> check labs w/a  >>> check pfts wa/  >>> max rx for GERD and ENT eval of sinusitis/ rhinitis (already ordered by Dr Shona  per pt)    >>> consider CPST if dx remains in doubt   >>> ? Anxiety/ depression  > usually at the bottom of this list of usual suspects but   may interfere with adherence and also interpretation of response or lack thereof to symptom management which can be quite subjective >> defer to Dr Shona who knows him the best.   Each maintenance medication was reviewed in detail including  emphasizing most importantly the difference between maintenance and prns and under what circumstances the prns are to be triggered using an action plan format where appropriate.  Total time for H and P, chart review, counseling,  directly observing portions of ambulatory 02 saturation study/ and generating customized AVS unique to this office visit / same day charting = 45 min new pt eval

## 2024-01-13 NOTE — Progress Notes (Signed)
 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   Also denies any obvious fluctuation of symptoms with weather or other environmental changes or other aggravating or alleviating factors except as outlined above   No unusual exposure hx or h/o childhood pna/ asthma or knowledge of premature birth.  Current Allergies, Complete Past Medical History, Past Surgical History, Family History, and Social History were reviewed in [employer] record.  ROS  The following are not active complaints unless bolded Hoarseness, sore throat, dysphagia, dental problems, itching, sneezing,  nasal congestion or discharge of excess mucus or purulent secretions, ear ache,   fever, chills, sweats, unintended wt loss or wt gain, classically pleuritic or exertional cp,  orthopnea pnd or arm/hand swelling  or leg swelling, presyncope, palpitations, abdominal pain, anorexia, nausea, vomiting, diarrhea  or change in bowel habits or change in bladder habits, change in stools or  change in urine, dysuria, hematuria,  rash, arthralgias, visual complaints, headache, numbness, weakness or ataxia or problems with walking or coordination,  change in mood or  memory.            Outpatient Medications Prior to Visit  Medication Sig Dispense Refill   ALPRAZolam  (XANAX ) 1 MG tablet Take 2 mg by mouth at bedtime.      aspirin  EC 81 MG tablet Take 1 tablet (81 mg total) by mouth daily. Swallow whole. 90 tablet 2   aspirin -sod bicarb-citric acid (ALKA-SELTZER) 325 MG TBEF tablet Take 650 mg by mouth every 6 (six) hours as needed (indigestion).     Biotin  800 MCG TABS Take by mouth daily.     clopidogrel  (PLAVIX ) 75 MG tablet Take 1 tablet (75 mg total) by mouth daily. 90 tablet 1   Evolocumab  (REPATHA  SURECLICK) 140 MG/ML SOAJ Inject 1 Dose into the skin every 14 (fourteen) days. 2 mL 4   fenofibrate  160 MG tablet Take 160 mg by mouth at bedtime.      HYDROcodone -acetaminophen  (NORCO) 10-325 MG tablet Take 1 tablet by mouth every 6 (six) hours as needed (for pain).     isosorbide  mononitrate (IMDUR ) 30 MG 24 hr tablet Take 0.5 tablets (15 mg total) by mouth at bedtime. For 1 week, then increase to 30 mg daily at bedtime (Patient taking differently: Take 30 mg by mouth at bedtime.)  90 tablet 1   magnesium  oxide (MAG-OX) 400 MG tablet Take 400 mg by mouth daily.     metoprolol  succinate (TOPROL -XL) 25 MG 24 hr tablet TAKE 1 TABLET BY MOUTH ONCE DAILY. 30 tablet 8   nitroGLYCERIN  (NITROSTAT ) 0.4 MG SL tablet Place 1 tablet (0.4 mg total) under the tongue every 5 (five) minutes as needed for chest pain. 25 tablet 3   pantoprazole  (PROTONIX ) 40 MG tablet Take 40 mg by mouth daily.     Probiotic Product (ALIGN) 4 MG CAPS Take 4 mg by mouth daily.     rosuvastatin  (CRESTOR ) 10 MG tablet Take 1 tablet (10 mg total) by mouth daily. 90 tablet 3   traZODone  (DESYREL ) 50 MG tablet Take 50 mg by mouth at bedtime.     vitamin B-12 (CYANOCOBALAMIN ) 500 MCG tablet Take 500 mcg by mouth daily.      Vitamin D-Vitamin K (VITAMIN K2-VITAMIN D3 PO) Take 1 capsule by mouth daily.     zinc  gluconate 50 MG tablet Take 50 mg by mouth daily.     No facility-administered medications prior to visit.    Past Medical History:  Diagnosis Date   Alcohol dependence in remission [facility])    Allergic rhinitis    Arthritis    Bipolar disorder (HCC)    CAD (coronary artery disease)    Cardiac catheterization May 2021 - medical therapy recommended   Chronic pain    Colon polyp    Tubular adenoma   COPD (chronic obstructive pulmonary disease) (HCC)    Gastric polyp    Tubular adenoma   GERD (gastroesophageal reflux disease)    Hypertension    Mixed hyperlipidemia       Objective:     BP (!) 153/81   Pulse 78   Ht 6' 3 (1.905 m)   Wt 203 lb 6.4 oz (92.3 kg)   SpO2 94% Comment: room air  BMI 25.42 kg/m   SpO2: 94 % (room air)  Amb somber wm, sniffling freq during hx /exam    HEENT : Oropharynx  clear      Nasal turbinates obst with white powder bilaterally (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       I personally reviewed images and agree with radiology impression as follows:  CXR:   pa and lat  10/14/23 1. No acute cardiopulmonary process. 2. Hyperinflation   I personally reviewed images and agree with radiology impression as follows:   Chest LDSCT     09/19/19 Centrilobular and paraseptal emphysema   Assessment   DOE (dyspnea on exertion) Quit smoking ? Around  early 2000s (inconsistent hx) - onset around 2022 assoc with severe rhinitis since spring 2024 and overt GERD - 01/13/2024   Walked on RA  x  3  lap(s) =  approx 450  ft  @ rapid  pace, stopped due to end of study s sob/ cp  with lowest 02 sats 96%    Symptoms are markedly disproportionate to objective findings and not clear to what extent this is actually a pulmonary  problem but pt does appear to have difficult to sort out respiratory symptoms of unknown origin for which  DDX  = almost all start with A and  include Adherence, Ace Inhibitors, Acid Reflux, Active Sinus Disease, Alpha 1 Antitripsin deficiency, Anxiety masquerading as Airways dz,  ABPA,  Allergy(esp in young), Aspiration (esp in elderly), Adverse effects of meds,  Active smoking or Vaping, A bunch of PE's/clot burden (a few small clots can't cause this syndrome unless there is already severe underlying pulm or vascular dz with poor reserve),  Anemia or thyroid disorder, plus two Bs  = Bronchiectasis and Beta blocker use..and one C= CHF    BOLDED dx's have not been excluded at this point with the leading suspects sinusitis/rhinitis and GERD.  He has nothing that sounds like asthma to me but does have emphysema on CT and PFTs have been ordered to assess this but I think this is not a major component to his sense of sob, esp when he says it is much better when he's on vacation away from present home and occupation   >>> check labs w/a  >>> check pfts wa/  >>> max rx for GERD and ENT eval of sinusitis/ rhinitis (already ordered by Dr Shona  per pt)    >>> consider CPST if dx remains in doubt   >>> ? Anxiety/ depression  > usually at the bottom of this list of usual suspects but   may interfere with adherence and also interpretation of response or lack thereof to symptom management which can be quite subjective >> defer to Dr Shona who knows him the best.   Each maintenance medication was reviewed in detail including emphasizing most  importantly the difference between maintenance and prns and under what circumstances the prns are to be triggered using an action plan format where appropriate.  Total time for H and P, chart review, counseling,  directly observing portions of ambulatory 02 saturation study/ and generating customized AVS unique to this office visit / same day charting = 45 min new pt eval                    Ozell America, MD 01/13/2024

## 2024-01-13 NOTE — Patient Instructions (Signed)
 Change your pantoprazole  and Take 30- 60 min before your first and last meals of the day   Wear mask when under a house  My office will be contacting you by phone for referral for Pulmonary function tests  - if you don't hear back from my office within one week please call us  back or notify us  thru MyChart and we'll address it right away   I will call when I have your tests back and go from there  In meantime please keep appt with the ENT of Dr Milford choice.

## 2024-01-14 ENCOUNTER — Telehealth: Payer: Self-pay | Admitting: Internal Medicine

## 2024-01-14 NOTE — Telephone Encounter (Signed)
 Spoke with patient regarding Tuesday 02/23/24 3:00pm PFT scheduled at [facility] time is 2:45 pm---1st floor registration desk--will mail information to patient along with the instructions for the study---patient voiced his understanding

## 2024-01-27 LAB — CBC WITH DIFFERENTIAL/PLATELET
Basophils Absolute: 0 10*3/uL (ref 0.0–0.2)
Basos: 1 %
EOS (ABSOLUTE): 0.1 10*3/uL (ref 0.0–0.4)
Eos: 2 %
Hematocrit: 39.9 % (ref 37.5–51.0)
Hemoglobin: 12.9 g/dL — ABNORMAL LOW (ref 13.0–17.7)
Immature Grans (Abs): 0 10*3/uL (ref 0.0–0.1)
Immature Granulocytes: 1 %
Lymphocytes Absolute: 1.3 10*3/uL (ref 0.7–3.1)
Lymphs: 30 %
MCH: 29.9 pg (ref 26.6–33.0)
MCHC: 32.3 g/dL (ref 31.5–35.7)
MCV: 93 fL (ref 79–97)
Monocytes Absolute: 0.6 10*3/uL (ref 0.1–0.9)
Monocytes: 14 %
Neutrophils Absolute: 2.3 10*3/uL (ref 1.4–7.0)
Neutrophils: 52 %
Platelets: 305 10*3/uL (ref 150–450)
RBC: 4.31 x10E6/uL (ref 4.14–5.80)
RDW: 13.1 % (ref 11.6–15.4)
WBC: 4.3 10*3/uL (ref 3.4–10.8)

## 2024-01-27 LAB — TSH: TSH: 0.314 u[IU]/mL — ABNORMAL LOW (ref 0.450–4.500)

## 2024-01-27 LAB — IGE: IgE (Immunoglobulin E), Serum: 156 [IU]/mL (ref 6–495)

## 2024-01-27 LAB — ALPHA-1-ANTITRYPSIN PHENOTYP: A-1 Antitrypsin: 167 mg/dL (ref 101–187)

## 2024-02-09 ENCOUNTER — Encounter: Payer: Self-pay | Admitting: Gastroenterology

## 2024-02-23 ENCOUNTER — Encounter (HOSPITAL_COMMUNITY): Payer: 59

## 2024-02-23 ENCOUNTER — Ambulatory Visit (HOSPITAL_COMMUNITY)
Admission: RE | Admit: 2024-02-23 | Discharge: 2024-02-23 | Disposition: A | Discharge status: Home / Self Care | Payer: 59 | Source: Ambulatory Visit | Attending: Internal Medicine | Admitting: Internal Medicine

## 2024-02-23 DIAGNOSIS — R0609 Other forms of dyspnea: Secondary | ICD-10-CM | POA: Diagnosis present

## 2024-02-23 LAB — PULMONARY FUNCTION TEST
DL/VA % pred: 82 %
DL/VA: 3.42 ml/min/mmHg/L
DLCO unc % pred: 79 %
DLCO unc: 25.42 ml/min/mmHg
FEF 25-75 Post: 3.5 L/s
FEF 25-75 Pre: 2.18 L/s
FEF2575-%Change-Post: 60 %
FEF2575-%Pred-Post: 99 %
FEF2575-%Pred-Pre: 62 %
FEV1-%Change-Post: 9 %
FEV1-%Pred-Post: 83 %
FEV1-%Pred-Pre: 76 %
FEV1-Post: 3.58 L
FEV1-Pre: 3.28 L
FEV1FVC-%Change-Post: -3 %
FEV1FVC-%Pred-Pre: 93 %
FEV6-%Change-Post: 10 %
FEV6-%Pred-Post: 91 %
FEV6-%Pred-Pre: 82 %
FEV6-Post: 4.95 L
FEV6-Pre: 4.5 L
FEV6FVC-%Change-Post: 1 %
FEV6FVC-%Pred-Post: 102 %
FEV6FVC-%Pred-Pre: 101 %
FVC-%Change-Post: 13 %
FVC-%Pred-Post: 92 %
FVC-%Pred-Pre: 81 %
FVC-Post: 5.23 L
FVC-Pre: 4.63 L
Post FEV1/FVC ratio: 68 %
Post FEV6/FVC ratio: 99 %
Pre FEV1/FVC ratio: 71 %
Pre FEV6/FVC Ratio: 97 %
RV % pred: 106 %
RV: 2.65 L
TLC % pred: 99 %
TLC: 7.98 L

## 2024-02-23 MED ORDER — ALBUTEROL SULFATE (2.5 MG/3ML) 0.083% IN NEBU
2.5000 mg | INHALATION_SOLUTION | Freq: Once | RESPIRATORY_TRACT | Status: AC
  Administered 2024-02-23: 2.5 mg via RESPIRATORY_TRACT

## 2024-02-27 NOTE — Progress Notes (Deleted)
 GI Office Note    Referring Provider: Benita Stabile, MD Primary Care Physician:  Benita Stabile, MD  Primary Gastroenterologist: Roetta Sessions, MD   Chief Complaint   No chief complaint on file.   History of Present Illness   Paul Arias is a 58 y.o. male presenting today for follow up. Last seen 09/2023. 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     EGD surveillance 2028. -minimal adenomatous changes in duodenal bulb, benign -abnormal esophagus c/w short segment Barrett's esophagus s/p bx c/w reflux no Barrett's on bx but confirmed in past, repeat EGD in five years  Colonoscopy 04/2022: -nonbleedng internal hemorrhoids -one 8mm polyp splenic flexure, removed with cold snare, tubular adenoma -next colonoscopy five years  Medications   Current Outpatient Medications  Medication Sig Dispense Refill   ALPRAZolam (XANAX) 1 MG tablet Take 2 mg by mouth at bedtime.      aspirin EC 81 MG tablet Take 1 tablet (81 mg total) by mouth daily. Swallow whole. 90 tablet 2   aspirin-sod bicarb-citric acid (ALKA-SELTZER) 325 MG TBEF tablet Take 650 mg by mouth every 6 (six) hours as needed (indigestion).     Biotin 800 MCG TABS Take by mouth daily.     clopidogrel (PLAVIX) 75 MG tablet Take 1 tablet (75 mg total) by mouth daily. 90 tablet 1   Evolocumab (REPATHA SURECLICK) 140 MG/ML SOAJ Inject 1 Dose into the skin every 14 (fourteen) days. 2 mL 4   fenofibrate 160 MG tablet Take 160 mg by mouth at bedtime.      HYDROcodone-acetaminophen (NORCO) 10-325 MG tablet Take 1 tablet by mouth every 6 (six) hours as needed (for pain).     isosorbide mononitrate (IMDUR) 30 MG 24 hr tablet Take 0.5 tablets (15 mg total) by mouth at bedtime. For 1 week, then increase to 30 mg daily at bedtime (Patient taking differently: Take 30 mg by mouth at bedtime.) 90 tablet 1   magnesium oxide (MAG-OX) 400 MG tablet Take  400 mg by mouth daily.     metoprolol succinate (TOPROL-XL) 25 MG 24 hr tablet TAKE 1 TABLET BY MOUTH ONCE DAILY. 30 tablet 8   nitroGLYCERIN (NITROSTAT) 0.4 MG SL tablet Place 1 tablet (0.4 mg total) under the tongue every 5 (five) minutes as needed for chest pain. 25 tablet 3   pantoprazole (PROTONIX) 40 MG tablet Take 40 mg by mouth daily.     Probiotic Product (ALIGN) 4 MG CAPS Take 4 mg by mouth daily.     rosuvastatin (CRESTOR) 10 MG tablet Take 1 tablet (10 mg total) by mouth daily. 90 tablet 3   traZODone (DESYREL) 50 MG tablet Take 50 mg by mouth at bedtime.     vitamin B-12 (CYANOCOBALAMIN) 500 MCG tablet Take 500 mcg by mouth daily.     Vitamin D-Vitamin K (VITAMIN K2-VITAMIN D3 PO) Take 1 capsule by mouth daily.     zinc gluconate 50 MG tablet Take 50 mg by mouth daily.     No current facility-administered medications for this visit.    Allergies   Allergies as of 02/29/2024   (No Known Allergies)     Past Medical History   Past Medical History:  Diagnosis Date   Alcohol dependence in remission [facility])    Allergic rhinitis    Arthritis    Bipolar disorder (HCC)    CAD (coronary artery disease)    Cardiac  catheterization May 2021 - medical therapy recommended   Chronic pain    Colon polyp    Tubular adenoma   COPD (chronic obstructive pulmonary disease) (HCC)    Gastric polyp    Tubular adenoma   GERD (gastroesophageal reflux disease)    Hypertension    Mixed hyperlipidemia     Past Surgical History   Past Surgical History:  Procedure Laterality Date   BIOPSY  02/09/2020   Procedure: BIOPSY;  Surgeon: Corbin Ade, MD;  [facility]: [facility];  Service: Endoscopy;;   BIOPSY  12/17/2020   Procedure: BIOPSY;  Surgeon: Lemar Lofty., MD;  Location: [facility] ENDOSCOPY;  Service: Gastroenterology;;   BIOPSY  04/10/2022   Procedure: BIOPSY;  Surgeon: Corbin Ade, MD;  Location: [facility];  Service: Endoscopy;;   CATARACT EXTRACTION W/PHACO Bilateral     COLONOSCOPY N/A 08/19/2016   Dr. Franky Macho: 2 semi-pedunculated polyps in the distal sigmoid colon removed. Measure 2-5 mm. Tubular adenomas.   COLONOSCOPY WITH PROPOFOL N/A 02/09/2020   7 polyps (tubular adenomas and hyperplastic)with recommended 5-year repeat (2026).   COLONOSCOPY WITH PROPOFOL N/A 04/10/2022   Procedure: COLONOSCOPY WITH PROPOFOL;  Surgeon: Corbin Ade, MD;  Location: [facility];  Service: Endoscopy;  Laterality: N/A;  2:00pm   CORONARY PRESSURE/FFR STUDY N/A 11/20/2023   Procedure: CORONARY PRESSURE/FFR STUDY;  Surgeon: Iran Ouch, MD;  Location: [facility] INVASIVE CV LAB;  Service: Cardiovascular;  Laterality: N/A;   CORONARY STENT INTERVENTION N/A 11/20/2023   Procedure: CORONARY STENT INTERVENTION;  Surgeon: Iran Ouch, MD;  Location: [facility] INVASIVE CV LAB;  Service: Cardiovascular;  Laterality: N/A;   ENDOSCOPIC MUCOSAL RESECTION N/A 05/02/2020   Procedure: ENDOSCOPIC MUCOSAL RESECTION;  Surgeon: Meridee Score Netty Starring., MD;  Location: [facility] ENDOSCOPY;  Service: Gastroenterology;  Laterality: N/A;   ESOPHAGOGASTRODUODENOSCOPY N/A 08/19/2016   Dr. Lovell Sheehan: Villous appearing mucosa in the first portion of the duodenum, polyp removed with hot snare, pathology tubular adenoma. Medium sized hiatal hernia. Biopsy for CLOtest which was negative.   ESOPHAGOGASTRODUODENOSCOPY (EGD) WITH PROPOFOL  08/20/2016   Dr. Jena Gauss: assisted Dr. Lovell Sheehan emergently. Esophagus normal. Medium sized hiatal hernia. One nonbleeding cratered stellate-shaped gastric ulcer measuring 8 mm. One cratered ulcerated area with adherent clot in the second portion of the duodenum. 25 mm in diameter. Visible vessel seen. Status post bleeding control therapy. Heaped up margins consistent with incomplete removal.    ESOPHAGOGASTRODUODENOSCOPY (EGD) WITH PROPOFOL N/A 10/27/2016   PROPOFOL;  Surgeon: Corbin Ade, MD; Barrett's esophagus without dysplasia or malignancy.  Normal stomach with previous  gastric ulcer healed.  2 duodenal polyps revealing fragments of small bowel adenoma, otherwise normal.  Recommendations to repeat endoscopy in 1 year.   ESOPHAGOGASTRODUODENOSCOPY (EGD) WITH PROPOFOL N/A 02/09/2020   probable short segment Barrett's s/p biopsy, multiple adenomatous appearing duodenal lesions s/p biopsy.   ESOPHAGOGASTRODUODENOSCOPY (EGD) WITH PROPOFOL N/A 05/02/2020   small hiatal hernia, mucosal nodule in duodenum with complete removal s/p piecemeal mucosal resection and avulsion s/p clips.   ESOPHAGOGASTRODUODENOSCOPY (EGD) WITH PROPOFOL N/A 12/17/2020   duodenal scar from prior EMR, small are of possible recurrence s/p removal. One fragment of duodenal adenoma. Needs surveillance EGD Jan 2023.   ESOPHAGOGASTRODUODENOSCOPY (EGD) WITH PROPOFOL N/A 04/10/2022   Procedure: ESOPHAGOGASTRODUODENOSCOPY (EGD) WITH PROPOFOL;  Surgeon: Corbin Ade, MD;  [facility];  Service: Endoscopy;  Laterality: N/A;   FLEXIBLE SIGMOIDOSCOPY N/A 08/20/2016   Dr. Lovell Sheehan: Hematin found in mid sigmoid colon. One hemostatic clip  placed.   HEMOSTASIS CLIP PLACEMENT  05/02/2020   Procedure: HEMOSTASIS CLIP PLACEMENT;  Surgeon: Lemar Lofty., MD;  Location: [facility] ENDOSCOPY;  Service: Gastroenterology;;   LEFT HEART CATH AND CORONARY ANGIOGRAPHY N/A 04/11/2020   Procedure: LEFT HEART CATH AND CORONARY ANGIOGRAPHY;  Surgeon: Tonny Bollman, MD;  Location[facility] INVASIVE CV LAB;  Service: Cardiovascular;  Laterality: N/A;   LEFT HEART CATH AND CORONARY ANGIOGRAPHY N/A 11/20/2023   Procedure: LEFT HEART CATH AND CORONARY ANGIOGRAPHY;  Surgeon: Iran Ouch, MD;  Location: [facility] INVAS[facility] CV LAB;  Service: Cardiovascular;  Laterality: N/A;   POLYPECTOMY  02/09/2020   Procedure: POLYPECTOMY;  Surgeon: Corbin Ade, MD;  [facility];  Service: Endoscopy;;   POLYPECTOMY  12/17/2020   Procedure: POLYPECTOMY;  Surgeon: Lemar Lofty., MD;  Location: [facility] ENDOSCOPY;   Service: Gastroenterology;;   POLYPECTOMY  04/10/2022   Procedure: POLYPECTOMY;  Surgeon: Corbin Ade, MD;  Location: [facility];  Service: Endoscopy;;   SUBMUCOSAL LIFTING INJECTION  05/02/2020   Procedure: SUBMUCOSAL LIFTING INJECTION;  Surgeon: Lemar Lofty., MD;  Location: [facility] ENDOSCOPY;  Service: Gastroenterology;;    Past Family History   Family History  Problem Relation Age of Onset   Cirrhosis Mother    CAD Mother 67       Premature CAD   Heart disease Father 25       Premature CAD   Esophageal cancer Brother    Unexplained death Brother 31       died in his sleep   Colon cancer Neg Hx    Inflammatory bowel disease Neg Hx    Liver disease Neg Hx    Pancreatic cancer Neg Hx    Stomach cancer Neg Hx     Past Social History   Social History   Socioeconomic History   Marital status: Single    Spouse name: Not on file   Number of children: 2   Years of education: Not on file   Highest education level: Not on file  Occupational History   Not on file  Tobacco Use   Smoking status: Former    Current packs/day: 0.00    Average packs/day: 0.3 packs/day for 40.0 years (10.0 ttl pk-yrs)    Types: Cigarettes    Start date: 09/08/1979    Quit date: 09/08/2019    Years since quitting: 4.4    Passive exposure: Never   Smokeless tobacco: Never  Vaping Use   Vaping status: Never Used  Substance and Sexual Activity   Alcohol use: Yes    Alcohol/week: 3.0 - 4.0 standard drinks of alcohol    Types: 3 - 4 Cans of beer per week    Comment: daily   Drug use: No   Sexual activity: Not Currently    Birth control/protection: None  Other Topics Concern   Not on file  Social History Narrative   Not on file   Social Drivers of Health   Financial Resource Strain: Not on file  Food Insecurity: Not on file  Transportation Needs: Not on file  Physical Activity: Not on file  Stress: Not on file  Social Connections: Not on file  Intimate Partner Violence: Not on  file    Review of Systems   General: Negative for anorexia, weight loss, fever, chills, fatigue, weakness. ENT: Negative for hoarseness, difficulty swallowing , nasal congestion. CV: Negative for chest pain, angina, palpitations, dyspnea on exertion, peripheral edema.  Respiratory: Negative for dyspnea at rest, dyspnea  on exertion, cough, sputum, wheezing.  GI: See history of present illness. GU:  Negative for dysuria, hematuria, urinary incontinence, urinary frequency, nocturnal urination.  Endo: Negative for unusual weight change.     Physical Exam   There were no vitals taken for this visit.   General: Well-nourished, well-developed in no acute distress.  Eyes: No icterus. Mouth: Oropharyngeal mucosa moist and pink , no lesions erythema or exudate. Lungs: Clear to auscultation bilaterally.  Heart: Regular rate and rhythm, no murmurs rubs or gallops.  Abdomen: Bowel sounds are normal, nontender, nondistended, no hepatosplenomegaly or masses,  no abdominal bruits or hernia , no rebound or guarding.  Rectal: ***  Extremities: No lower extremity edema. No clubbing or deformities. Neuro: Alert and oriented x 4   Skin: Warm and dry, no jaundice.   Psych: Alert and cooperative, normal mood and affect.  Labs   Lab Results  Component Value Date   NA 133 (L) 11/16/2023   CL 100 11/16/2023   K 4.2 11/16/2023   CO2 25 11/16/2023   BUN 15 11/16/2023   CREATININE 1.12 11/16/2023   GFRNONAA >60 11/16/2023   CALCIUM 9.3 11/16/2023   ALBUMIN 3.4 (L) 08/19/2016   GLUCOSE 104 (H) 11/16/2023   Lab Results  Component Value Date   ALT 23 08/19/2016   AST 33 08/19/2016   ALKPHOS 43 08/19/2016   BILITOT 0.7 08/19/2016   Lab Results  Component Value Date   WBC 4.3 01/13/2024   HGB 12.9 (L) 01/13/2024   HCT 39.9 01/13/2024   MCV 93 01/13/2024   PLT 305 01/13/2024   Lab Results  Component Value Date   TSH 0.314 (L) 01/13/2024    Imaging Studies   No results  found.  Assessment       PLAN   ***   Leanna Battles. Melvyn Neth, MHS, PA-C [facility]

## 2024-02-28 ENCOUNTER — Encounter: Payer: Self-pay | Admitting: Internal Medicine

## 2024-02-28 DIAGNOSIS — J449 Chronic obstructive pulmonary disease, unspecified: Secondary | ICD-10-CM | POA: Insufficient documentation

## 2024-02-29 ENCOUNTER — Ambulatory Visit: Admitting: Gastroenterology

## 2024-02-29 ENCOUNTER — Encounter: Payer: Self-pay | Admitting: Gastroenterology

## 2024-03-01 ENCOUNTER — Telehealth: Payer: Self-pay | Admitting: Internal Medicine

## 2024-03-01 NOTE — Telephone Encounter (Signed)
 Patient would like a call regarding his 02/23/24 PFT results--[phone]

## 2024-03-01 NOTE — Telephone Encounter (Signed)
 I left a message for the patient to return my call.

## 2024-03-02 NOTE — Telephone Encounter (Signed)
 Called and spoke with patient regarding his results per result note , pt has an  appt schedule to follow up with ENT on 04/27/24 and made an appt to follow up with Dr Sherene Sires to discuss result of PFT after he is seen by ENT.

## 2024-03-08 ENCOUNTER — Other Ambulatory Visit: Payer: Self-pay | Admitting: Cardiology

## 2024-03-10 ENCOUNTER — Telehealth: Payer: Self-pay | Admitting: Cardiology

## 2024-03-10 NOTE — Telephone Encounter (Signed)
 Pt came in office today asking to see a nurse. He stated that his legs from his knee down is swelling and hurting. He stated that last night it was the whole leg. He stated it is in both legs.

## 2024-03-10 NOTE — Telephone Encounter (Signed)
 Spoke to pt who stated that he weighed himself one day this week and weight was 207 lb at home. Weighed pt in office and weight was 220 lb.   Pt stated he noticed leg swelling two days ago. Pt stated that it is a little painful to walk. Pt stated that he is not eating sodium- has cut out the frozen meals and canned soup. Pt stated that he has laid down on the couch and elevated his feet on the arm rest of the couch. Pt admits to walking a lot at work with very little chance to elevate feet/legs at work.

## 2024-03-10 NOTE — Progress Notes (Signed)
 GI Office Note    Referring Provider: Benita Stabile, MD Primary Care Physician:  Benita Stabile, MD  Primary Gastroenterologist: Roetta Sessions, MD   Chief Complaint   Chief Complaint  Patient presents with   Follow-up    History of Present Illness   Paul Arias is a 58 y.o. male presenting today for follow up. Last seen 09/2023. 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    No Hb. Some dysphagia, hard to swallow at times. Feels like something going on in throat area. Sees ENT 5/22 for sinus issues. No abdominal pain. BM somewhat hard but goes twice a day. No melena, brbpr. See by pulmonology recently.   EGD 04/2022: -minimal adenomatous changes in duodenal bulb, benign -abnormal esophagus c/w short segment Barrett's esophagus s/p bx c/w reflux no Barrett's on bx but confirmed in past, repeat EGD in five years   Colonoscopy 04/2022: -nonbleedng internal hemorrhoids -one 8mm polyp splenic flexure, removed with cold snare, tubular adenoma -next colonoscopy five years  Medications   Current Outpatient Medications  Medication Sig Dispense Refill   ALPRAZolam (XANAX) 1 MG tablet Take 2 mg by mouth at bedtime.      aspirin EC 81 MG tablet Take 1 tablet (81 mg total) by mouth daily. Swallow whole. 90 tablet 2   aspirin-sod bicarb-citric acid (ALKA-SELTZER) 325 MG TBEF tablet Take 650 mg by mouth every 6 (six) hours as needed (indigestion).     Biotin 800 MCG TABS Take by mouth daily.     clopidogrel (PLAVIX) 75 MG tablet Take 1 tablet (75 mg total) by mouth daily. 90 tablet 1   Evolocumab (REPATHA SURECLICK) 140 MG/ML SOAJ Inject 1 Dose into the skin every 14 (fourteen) days. 2 mL 4   fenofibrate 160 MG tablet Take 160 mg by mouth at bedtime.      finasteride (PROSCAR) 5 MG tablet Take 5 mg by mouth daily.     furosemide (LASIX) 40 MG tablet Take 1 tablet (40 mg total) by mouth daily for 3  days, THEN 1 tablet (40 mg total) daily as needed. 90 tablet 1   HYDROcodone-acetaminophen (NORCO) 10-325 MG tablet Take 1 tablet by mouth every 6 (six) hours as needed (for pain).     isosorbide mononitrate (IMDUR) 30 MG 24 hr tablet Take 1 tablet (30 mg total) by mouth daily. 30 tablet 5   magnesium oxide (MAG-OX) 400 MG tablet Take 400 mg by mouth daily.     metoprolol succinate (TOPROL-XL) 25 MG 24 hr tablet TAKE 1 TABLET BY MOUTH ONCE DAILY. 30 tablet 8   nitroGLYCERIN (NITROSTAT) 0.4 MG SL tablet Place 1 tablet (0.4 mg total) under the tongue every 5 (five) minutes as needed for chest pain. 25 tablet 3   omeprazole (PRILOSEC) 40 MG capsule Take 40 mg by mouth 2 (two) times daily.     potassium chloride (KLOR-CON) 10 MEQ tablet Take 1 tablet (10 mEq total) by mouth daily for 3 days, THEN 1 tablet (10 mEq total) daily as needed. 90 tablet 1   Probiotic Product (ALIGN) 4 MG CAPS Take 4 mg by mouth daily.     rosuvastatin (CRESTOR) 10 MG tablet Take 1 tablet (10 mg total) by mouth daily. 90 tablet 3   traZODone (DESYREL) 50 MG tablet Take 50 mg by mouth at bedtime.     vitamin B-12 (CYANOCOBALAMIN) 500 MCG tablet Take 500 mcg by mouth daily.  Vitamin D-Vitamin K (VITAMIN K2-VITAMIN D3 PO) Take 1 capsule by mouth daily.     zinc gluconate 50 MG tablet Take 50 mg by mouth daily.     No current facility-administered medications for this visit.    Allergies   Allergies as of 03/11/2024   (No Known Allergies)       Review of Systems   General: Negative for anorexia, weight loss, fever, chills, fatigue, weakness. ENT: + for all the following hoarseness, difficulty swallowing , nasal congestion. CV: Negative for chest pain, angina, palpitations, dyspnea on exertion, peripheral edema.  Respiratory: Negative for dyspnea at rest, dyspnea on exertion, cough, sputum, wheezing.  GI: See history of present illness. GU:  Negative for dysuria, hematuria, urinary incontinence, urinary frequency,  nocturnal urination.  Endo: Negative for unusual weight change.     Physical Exam   BP 112/67 (BP Location: Right Arm, Patient Position: Sitting, Cuff Size: Large)   Pulse 69   Temp 98.6 F (37 C) (Oral)   Ht 6\' 3"  (1.905 m)   Wt 217 lb (98.4 kg)   SpO2 97%   BMI 27.12 kg/m    General: Well-nourished, well-developed in no acute distress.  Eyes: No icterus. Mouth: Oropharyngeal mucosa moist and pink   Lungs: Clear to auscultation bilaterally.  Heart: Regular rate and rhythm, no murmurs rubs or gallops.  Abdomen: Bowel sounds are normal, nontender, nondistended, no hepatosplenomegaly or masses,  no abdominal bruits or hernia , no rebound or guarding.  Rectal: not performed  Extremities: No lower extremity edema. No clubbing or deformities. Neuro: Alert and oriented x 4   Skin: Warm and dry, no jaundice.   Psych: Alert and cooperative, normal mood and affect.  Labs   Lab Results  Component Value Date   TSH 0.314 (L) 01/13/2024   Lab Results  Component Value Date   NA 133 (L) 11/16/2023   CL 100 11/16/2023   K 4.2 11/16/2023   CO2 25 11/16/2023   BUN 15 11/16/2023   CREATININE 1.12 11/16/2023   GFRNONAA >60 11/16/2023   CALCIUM 9.3 11/16/2023   ALBUMIN 3.4 (L) 08/19/2016   GLUCOSE 104 (H) 11/16/2023   Lab Results  Component Value Date   ALT 23 08/19/2016   AST 33 08/19/2016   ALKPHOS 43 08/19/2016   BILITOT 0.7 08/19/2016   Lab Results  Component Value Date   WBC 4.3 01/13/2024   HGB 12.9 (L) 01/13/2024   HCT 39.9 01/13/2024   MCV 93 01/13/2024   PLT 305 01/13/2024    Imaging Studies   No results found.  Assessment/Plan:   GERD/dysphagia: -continue omeprazole BID before meals. Await ENT evaluation, may require repeat EGD/ED  Constipation: -add colace or miralax daily, instructions for each provided.   Mildly low Hgb, normal HCT: -improved, near normal -no overt GI bleeding -last EGD/TCS  in 2023  [facility]. Melvyn Neth, MHS, PA-C [facility]

## 2024-03-11 ENCOUNTER — Telehealth: Payer: Self-pay | Admitting: Pharmacy Technician

## 2024-03-11 ENCOUNTER — Encounter: Payer: Self-pay | Admitting: Gastroenterology

## 2024-03-11 ENCOUNTER — Ambulatory Visit (INDEPENDENT_AMBULATORY_CARE_PROVIDER_SITE_OTHER): Admitting: Gastroenterology

## 2024-03-11 ENCOUNTER — Other Ambulatory Visit (HOSPITAL_COMMUNITY): Payer: Self-pay

## 2024-03-11 VITALS — BP 112/67 | HR 69 | Temp 98.6°F | Ht 75.0 in | Wt 217.0 lb

## 2024-03-11 DIAGNOSIS — K2270 Barrett's esophagus without dysplasia: Secondary | ICD-10-CM

## 2024-03-11 DIAGNOSIS — K5900 Constipation, unspecified: Secondary | ICD-10-CM | POA: Diagnosis not present

## 2024-03-11 DIAGNOSIS — K219 Gastro-esophageal reflux disease without esophagitis: Secondary | ICD-10-CM | POA: Diagnosis not present

## 2024-03-11 DIAGNOSIS — R1310 Dysphagia, unspecified: Secondary | ICD-10-CM | POA: Diagnosis not present

## 2024-03-11 MED ORDER — POTASSIUM CHLORIDE ER 10 MEQ PO TBCR: EXTENDED_RELEASE_TABLET | ORAL | 1 refills | Status: DC

## 2024-03-11 MED ORDER — FUROSEMIDE 40 MG PO TABS: ORAL_TABLET | ORAL | 1 refills | Status: DC

## 2024-03-11 NOTE — Telephone Encounter (Signed)
 Patient notified of providers recommendations. Patient agreeable to plan and had no further questions or concerns at this time. Pt will track weight/lower extremity edema. Pt has appt with provider on 4/15.   Medication sent to [facility] pharmacy per pt's request.

## 2024-03-11 NOTE — Telephone Encounter (Signed)
 Pharmacy Patient Advocate Encounter   Received notification from CoverMyMeds that prior authorization for Repatha is required/requested.   Insurance verification completed.   The patient is insured through  [employer]  .   Per test claim: PA required; PA submitted to above mentioned insurance via CoverMyMeds Key/confirmation #/EOC  [id number] Status is pending

## 2024-03-11 NOTE — Telephone Encounter (Signed)
 Pharmacy Patient Advocate Encounter  Received notification from  [employer]  that Prior Authorization for Repatha has been APPROVED from 03/11/24 to 03/11/25. Ran test claim, Copay is $35.00- one month. This test claim was processed through [facility]- copay amounts may vary at other pharmacies due to pharmacy/plan contracts, or as the patient moves through the different stages of their insurance plan.   PA #/Case ID/Reference #: [id number]

## 2024-03-11 NOTE — Patient Instructions (Addendum)
 Continue omeprazole twice daily before breakfast and in evening. Add a stool softener every day. You can use miralax one capful daily mixed in water, coffee, juice. This is my preferred option. Otherwise you can use docusate sodium 100mg , take two at bedtime. I will look for your ENT notes after your upcoming visit. If you don't hear further instructions from [place] within a couple of weeks after your ENT appointment, please call and remind me that you have completed the ENT appointment. Return here in one year for routine follow up.

## 2024-03-22 ENCOUNTER — Ambulatory Visit: Payer: 59 | Attending: Cardiology | Admitting: Cardiology

## 2024-03-22 ENCOUNTER — Encounter: Payer: Self-pay | Admitting: Cardiology

## 2024-03-22 VITALS — BP 138/78 | HR 65 | Ht 75.0 in | Wt 212.4 lb

## 2024-03-22 DIAGNOSIS — E782 Mixed hyperlipidemia: Secondary | ICD-10-CM | POA: Diagnosis not present

## 2024-03-22 DIAGNOSIS — I25119 Atherosclerotic heart disease of native coronary artery with unspecified angina pectoris: Secondary | ICD-10-CM

## 2024-03-22 MED ORDER — ISOSORBIDE MONONITRATE ER 30 MG PO TB24: 30.0000 mg | ORAL_TABLET | Freq: Every day | ORAL | 0 refills | Status: DC

## 2024-03-22 MED ORDER — CLOPIDOGREL BISULFATE 75 MG PO TABS: 75.0000 mg | ORAL_TABLET | Freq: Every day | ORAL | 3 refills | Status: AC

## 2024-03-22 NOTE — Patient Instructions (Signed)
 Medication Instructions:  Your physician recommends that you continue on your current medications as directed. Please refer to the Current Medication list given to you today.   Labwork: None today  Testing/Procedures: None today  Follow-Up: 6 months  Any Other Special Instructions Will Be Listed Below (If Applicable).  If you need a refill on your cardiac medications before your next appointment, please call your pharmacy.

## 2024-03-22 NOTE — Addendum Note (Signed)
 Addended by: Tibor Lemmons A on: 03/22/2024 04:45 PM   Modules accepted: Orders

## 2024-03-22 NOTE — Progress Notes (Signed)
    Cardiology Office Note  Date: 03/22/2024   ID: TUFF CLABO, DOB 10/28/1966, MRN [id number]  History of Present Illness: Paul Arias is a 58 y.o. male last seen in January by Ms. Tommi Fraise, I reviewed the note.  He is here for a follow-up visit.  He did have interval leg swelling and weight gain as detailed in the chart, improved with use of Lasix 40 mg and potassium supplement for just a few days.  States that he is back to baseline.  He does not report any angina, but still has intermittent dyspnea on exertion.  He has been seen by Pulmonary and also has an ENT visit scheduled.  We went over his medications.  He does need a refill on Plavix.  Lipid panel in January showed good LDL control at 58.  Physical Exam: VS:  BP 138/78   Pulse 65   Ht 6\' 3"  (1.905 m)   Wt 212 lb 6.4 oz (96.3 kg)   SpO2 96%   BMI 26.55 kg/m , BMI Body mass index is 26.55 kg/m.  Wt Readings from Last 3 Encounters:  03/22/24 212 lb 6.4 oz (96.3 kg)  03/11/24 217 lb (98.4 kg)  01/13/24 203 lb 6.4 oz (92.3 kg)    General: Patient appears comfortable at rest. HEENT: Conjunctiva and lids normal. Neck: Supple, no elevated JVP or carotid bruits. Lungs: Clear to auscultation, nonlabored breathing at rest. Cardiac: Regular rate and rhythm, no S3 or significant systolic murmur, no pericardial rub. Extremities: No pitting edema.  ECG:  An ECG dated 11/20/2023 was personally reviewed today and demonstrated:  Sinus rhythm.  Labwork: 11/16/2023: BUN 15; Creatinine, Ser 1.12; Potassium 4.2; Sodium 133 01/13/2024: Hemoglobin 12.9; Platelets 305; TSH 0.314  January 2025: Cholesterol 128, triglycerides 131, HDL 47, LDL 58  Other Studies Reviewed Today:  No interval cardiac testing for review today.  Assessment and Plan:  1.  CAD with chronically occluded RCA associated with left-to-right collaterals and moderate proximal to mid LAD stenosis with IFR ratio of 0.88 by follow-up cardiac catheterization in  December 2024 status post DES to the LAD at that time.  LVEF 60 to 65% by echocardiogram in September 2024.  He does not report any angina, does have dyspnea on exertion which may be multifactorial.  Leg swelling improved with temporary use of Lasix and potassium supplement, can continue as needed use.  Continue aspirin 81 mg daily, Plavix 75 mg daily, Imdur 30 mg daily, Toprol XL 25 mg daily, and lipid-lowering regimen.   2.  Mixed hyperlipidemia.  LDL 58 in January.  He continues on Repatha 140 mg/mL every 14 days and Crestor 10 mg daily.  Disposition:  Follow up  6 months.  Signed, Gerard Knight, M.D., F.A.C.C. [facility]

## 2024-04-24 NOTE — Progress Notes (Signed)
 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  -Allergy screen 01/13/24   Eos 0.1 /  IgE  156   alpha one AT  MM / 167 - PFT's  02/23/24   FEV1 3.58 (83 % ) ratio 0.68  p 9 % improvement from saba p 0 prior to study with DLCO  25.42 (79%)   and FV curve  truncated insp > exp      04/27/24 ENT eval by Darlin Ehrlich did not examine throat.   04/28/2024  f/u ov/[place] office/Lorissa Kishbaugh re: GOLD 1 copd  maint on gerd rx only   Chief Complaint  Patient presents with   Shortness of Breath  Dyspnea: no problem flat  Cough: sensation of globus x years  Sleeping: flat bed 1 pillows s    resp cc  SABA use: none  02: none    No obvious day to day or daytime variability or assoc excess/ purulent sputum or mucus plugs or hemoptysis or cp or chest tightness, subjective wheeze or overt sinus or hb symptoms.    Also denies any obvious fluctuation of symptoms with weather or environmental changes or other aggravating or alleviating factors except as outlined above    No unusual exposure hx or h/o childhood pna/ asthma or knowledge of premature birth.  Current Allergies, Complete Past Medical History, Past Surgical History, Family History, and Social History were reviewed in [employer] record.  ROS  The following are not active complaints unless bolded Hoarseness, sore throat/globus , dysphagia, dental problems, itching, sneezing,  nasal congestion or discharge of excess mucus or purulent secretions, ear ache,   fever, chills, sweats, unintended wt loss or wt gain, classically pleuritic or exertional cp,  orthopnea pnd or arm/hand swelling  or leg swelling, presyncope, palpitations, abdominal pain, anorexia, nausea, vomiting, diarrhea  or change in bowel habits or change in bladder habits, change in stools or change in urine, dysuria, hematuria,  rash, arthralgias, visual complaints, headache, numbness, weakness or ataxia or problems with walking or coordination,  change in mood or  memory.        Current Meds  Medication Sig   ALPRAZolam  (XANAX ) 1 MG tablet Take 2 mg by mouth at bedtime.    aspirin  EC 81 MG tablet Take  1 tablet (81 mg total) by mouth daily. Swallow whole.   aspirin -sod bicarb-citric acid (ALKA-SELTZER) 325 MG TBEF tablet Take 650 mg by mouth every 6 (six) hours as needed (indigestion).   Biotin  800 MCG TABS Take by mouth daily.   clopidogrel  (PLAVIX ) 75 MG tablet Take 1 tablet (75 mg total) by mouth daily.   Evolocumab  (REPATHA  SURECLICK) 140 MG/ML SOAJ Inject 1 Dose into the skin every 14 (fourteen) days.   fenofibrate  160 MG tablet Take 160 mg by mouth at bedtime.    finasteride (PROSCAR) 5 MG tablet Take 5 mg by mouth daily.   furosemide  (LASIX ) 40 MG tablet Take 1 tablet (40 mg total) by mouth daily for 3 days, THEN 1 tablet (40 mg total) daily as needed.   HYDROcodone -acetaminophen  (NORCO) 10-325 MG tablet Take 1 tablet by mouth every 6 (six) hours as needed (for pain).   magnesium  oxide (MAG-OX) 400 MG tablet  Take 400 mg by mouth daily.   metoprolol  succinate (TOPROL -XL) 25 MG 24 hr tablet TAKE 1 TABLET BY MOUTH ONCE DAILY.   nitroGLYCERIN  (NITROSTAT ) 0.4 MG SL tablet Place 1 tablet (0.4 mg total) under the tongue every 5 (five) minutes as needed for chest pain.   omeprazole  (PRILOSEC) 40 MG capsule Take 40 mg by mouth 2 (two) times daily.   potassium chloride  (KLOR-CON ) 10 MEQ tablet Take 1 tablet (10 mEq total) by mouth daily for 3 days, THEN 1 tablet (10 mEq total) daily as needed.   Probiotic Product (ALIGN) 4 MG CAPS Take 4 mg by mouth daily.   rosuvastatin  (CRESTOR ) 10 MG tablet Take 1 tablet (10 mg total) by mouth daily.   traZODone  (DESYREL ) 50 MG tablet Take 50 mg by mouth at bedtime.   vitamin B-12 (CYANOCOBALAMIN) 500 MCG tablet Take 500 mcg by mouth daily.   Vitamin D-Vitamin K (VITAMIN K2-VITAMIN D3 PO) Take 1 capsule by mouth daily.   zinc  gluconate 50 MG tablet Take 50 mg by mouth daily.           Past Medical History:  Diagnosis Date   Alcohol dependence in remission [facility])    Allergic rhinitis    Arthritis    Bipolar disorder (HCC)    CAD (coronary artery disease)    Cardiac catheterization May 2021 - medical therapy recommended   Chronic pain    Colon polyp    Tubular adenoma   COPD (chronic obstructive pulmonary disease) (HCC)    Gastric polyp    Tubular adenoma   GERD (gastroesophageal reflux disease)    Hypertension    Mixed hyperlipidemia       Objective:    Wt Readings from Last 3 Encounters:  04/28/24 211 lb (95.7 kg)  04/27/24 210 lb (95.3 kg)  03/22/24 212 lb 6.4 oz (96.3 kg)      Vital signs reviewed  04/28/2024  - Note at rest 02 sats  92% on RA  Appearance:    amb wm /occ throat clearing     HEENT : Oropharynx  NL     Nasal turbinates NL    NECK :  without  apparent JVD/ palpable Nodes/TM    LUNGS: no acc muscle use,  Nl contour chest which is clear to A and P bilaterally without cough on insp or exp maneuvers   CV:  RRR  no s3 or murmur  or increase in P2, and no edema   ABD:  soft and nontender   MS:  Gait NL   ext warm  without deformities Or obvious joint restrictions  calf tenderness, cyanosis or clubbing    SKIN: warm and dry without lesions    NEURO:  alert, approp, nl sensorium with  no motor or cerebellar deficits apparent.         Assessment

## 2024-04-26 ENCOUNTER — Other Ambulatory Visit (HOSPITAL_COMMUNITY): Payer: Self-pay | Admitting: Internal Medicine

## 2024-04-26 ENCOUNTER — Ambulatory Visit: Payer: Self-pay | Admitting: Nurse Practitioner

## 2024-04-26 DIAGNOSIS — M79604 Pain in right leg: Secondary | ICD-10-CM

## 2024-04-26 NOTE — Telephone Encounter (Signed)
-----   Message from Laneta Pintos sent at 04/26/2024  2:57 PM EDT ----- Labs reviewed.  Overall stable with mildly low hemoglobin, normal electrolytes, kidney function, and liver enzymes.  I note that his cholesterol numbers are significantly higher than what was recorded in January 2025.  We have rosuvastatin  and repatha  on his med list, but based on these results, it appears one of those two has likely fallen off.  Please follow-up to see if he needs refills or has had issues w/ compliance.

## 2024-04-26 NOTE — Telephone Encounter (Signed)
 Patient saw his PCP today and they discussed his recent lab work. He admits he may have missed 1 month of Repatha  but says she just got refilled and he will be more attentive. He also confirmed he is taking crestor .

## 2024-04-27 ENCOUNTER — Ambulatory Visit (INDEPENDENT_AMBULATORY_CARE_PROVIDER_SITE_OTHER): Admitting: Otolaryngology

## 2024-04-27 ENCOUNTER — Encounter (INDEPENDENT_AMBULATORY_CARE_PROVIDER_SITE_OTHER): Payer: Self-pay | Admitting: Otolaryngology

## 2024-04-27 VITALS — BP 153/87 | HR 60 | Ht 75.0 in | Wt 210.0 lb

## 2024-04-27 DIAGNOSIS — R040 Epistaxis: Secondary | ICD-10-CM

## 2024-04-28 ENCOUNTER — Encounter: Payer: Self-pay | Admitting: Internal Medicine

## 2024-04-28 ENCOUNTER — Ambulatory Visit: Admitting: Internal Medicine

## 2024-04-28 ENCOUNTER — Telehealth: Payer: Self-pay

## 2024-04-28 VITALS — BP 152/79 | HR 66 | Ht 75.0 in | Wt 211.0 lb

## 2024-04-28 DIAGNOSIS — Z87891 Personal history of nicotine dependence: Secondary | ICD-10-CM

## 2024-04-28 DIAGNOSIS — R1310 Dysphagia, unspecified: Secondary | ICD-10-CM

## 2024-04-28 DIAGNOSIS — R040 Epistaxis: Secondary | ICD-10-CM | POA: Insufficient documentation

## 2024-04-28 DIAGNOSIS — J449 Chronic obstructive pulmonary disease, unspecified: Secondary | ICD-10-CM

## 2024-04-28 NOTE — Telephone Encounter (Signed)
 Pt was made aware and verbalized understanding.

## 2024-04-28 NOTE — Progress Notes (Signed)
 CC: Bilateral epistaxis  HPI:  Paul Arias is a 58 y.o. male who presents today for evaluation of his recurrent epistaxis.  According to the patient, he had an episode of severe bilateral epistaxis 2 months ago.  He was seen in an urgent care center, and was treated with Afrin spray.  Since then, he has had only minor bleeding.  He denies any recent nasal trauma.  He is on Plavix  due to his cardiac stent placement.  He has no previous ENT surgery.  Currently he denies any facial pain, fever, or visual change.  Past Medical History:  Diagnosis Date   Alcohol dependence in remission [facility])    Allergic rhinitis    Arthritis    Bipolar disorder (HCC)    CAD (coronary artery disease)    Cardiac catheterization May 2021 - medical therapy recommended   Chronic pain    Colon polyp    Tubular adenoma   COPD (chronic obstructive pulmonary disease) (HCC)    Gastric polyp    Tubular adenoma   GERD (gastroesophageal reflux disease)    Hypertension    Mixed hyperlipidemia     Past Surgical History:  Procedure Laterality Date   BIOPSY  02/09/2020   Procedure: BIOPSY;  Surgeon: Suzette Espy, MD;  Location: [facility];  Service: Endoscopy;;   BIOPSY  12/17/2020   Procedure: BIOPSY;  Surgeon: Normie Becton., MD;  Location: [facility] ENDOSCOPY;  Service: Gastroenterology;;   BIOPSY  04/10/2022   Procedure: BIOPSY;  Surgeon: Suzette Espy, MD;  Location: [facility];  Service: Endoscopy;;   CATARACT EXTRACTION W/PHACO Bilateral    COLONOSCOPY N/A 08/19/2016   Dr. Alanda Allegra: 2 semi-pedunculated polyps in the distal sigmoid colon removed. Measure 2-5 mm. Tubular adenomas.   COLONOSCOPY WITH PROPOFOL  N/A 02/09/2020   7 polyps (tubular adenomas and hyperplastic)with recommended 5-year repeat (2026).   COLONOSCOPY WITH PROPOFOL  N/A 04/10/2022   Procedure: COLONOSCOPY WITH PROPOFOL ;  Surgeon: Suzette Espy, MD;  [facility]: [facility];  Service: Endoscopy;  Laterality: N/A;  2:00pm    CORONARY PRESSURE/FFR STUDY N/A 11/20/2023   Procedure: CORONARY PRESSURE/FFR STUDY;  Surgeon: Wenona Hamilton, MD;  Location: [facility] INVAS[facility] CV LAB;  Service: Cardiovascular;  Laterality: N/A;   CORONARY STENT INTERVENTION N/A 11/20/2023   Procedure: CORONARY STENT INTERVENTION;  Surgeon: Wenona Hamilton, MD;  Location: [facility] INVAS[facility] CV LAB;  Service: Cardiovascular;  Laterality: N/A;   ENDOSCOPIC MUCOSAL RESECTION N/A 05/02/2020   Procedure: ENDOSCOPIC MUCOSAL RESECTION;  Surgeon: Brice Campi Albino Alu., MD;  Location: [facility] ENDOSCOPY;  Service: Gastroenterology;  Laterality: N/A;   ESOPHAGOGASTRODUODENOSCOPY N/A 08/19/2016   Dr. Larrie Po: Villous appearing mucosa in the first portion of the duodenum, polyp removed with hot snare, pathology tubular adenoma. Medium sized hiatal hernia. Biopsy for CLOtest which was negative.   ESOPHAGOGASTRODUODENOSCOPY (EGD) WITH PROPOFOL   08/20/2016   Dr. Riley Cheadle: assisted Dr. Larrie Po emergently. Esophagus normal. Medium sized hiatal hernia. One nonbleeding cratered stellate-shaped gastric ulcer measuring 8 mm. One cratered ulcerated area with adherent clot in the second portion of the duodenum. 25 mm in diameter. Visible vessel seen. Status post bleeding control therapy. Heaped up margins consistent with incomplete removal.    ESOPHAGOGASTRODUODENOSCOPY (EGD) WITH PROPOFOL  N/A 10/27/2016   PROPOFOL ;  Surgeon: Suzette Espy, MD; Barrett's esophagus without dysplasia or malignancy.  Normal stomach with previous gastric ulcer healed.  2 duodenal polyps revealing fragments of small bowel adenoma, otherwise normal.  Recommendations to repeat endoscopy in 1 year.  ESOPHAGOGASTRODUODENOSCOPY (EGD) WITH PROPOFOL  N/A 02/09/2020   probable short segment Barrett's s/p biopsy, multiple adenomatous appearing duodenal lesions s/p biopsy.   ESOPHAGOGASTRODUODENOSCOPY (EGD) WITH PROPOFOL  N/A 05/02/2020   small hiatal hernia, mucosal nodule in duodenum with complete removal s/p  piecemeal mucosal resection and avulsion s/p clips.   ESOPHAGOGASTRODUODENOSCOPY (EGD) WITH PROPOFOL  N/A 12/17/2020   duodenal scar from prior EMR, small are of possible recurrence s/p removal. One fragment of duodenal adenoma. Needs surveillance EGD Jan 2023.   ESOPHAGOGASTRODUODENOSCOPY (EGD) WITH PROPOFOL  N/A 04/10/2022   Procedure: ESOPHAGOGASTRODUODENOSCOPY (EGD) WITH PROPOFOL ;  Surgeon: Suzette Espy, MD;  [facility];  Service: Endoscopy;  Laterality: N/A;   FLEXIBLE SIGMOIDOSCOPY N/A 08/20/2016   Dr. Larrie Po: Hematin found in mid sigmoid colon. One hemostatic clip placed.   HEMOSTASIS CLIP PLACEMENT  05/02/2020   Procedure: HEMOSTASIS CLIP PLACEMENT;  Surgeon: Normie Becton., MD;  Location: [facility];  Service: Gastroenterology;;   LEFT HEART CATH AND CORONARY ANGIOGRAPHY N/A 04/11/2020   Procedure: LEFT HEART CATH AND CORONARY ANGIOGRAPHY;  Surgeon: Arnoldo Lapping, MD;  Location: [facility] INVASIVE CV LAB;  Service: Cardiovascular;  Laterality: N/A;   LEFT HEART CATH AND CORONARY ANGIOGRAPHY N/A 11/20/2023   Procedure: LEFT HEART CATH AND CORONARY ANGIOGRAPHY;  Surgeon: Wenona Hamilton, MD;  Location: [facility];  Service: Cardiovascular;  Laterality: N/A;   POLYPECTOMY  02/09/2020   Procedure: POLYPECTOMY;  Surgeon: Suzette Espy, MD;  [facility];  Service: Endoscopy;;   POLYPECTOMY  12/17/2020   Procedure: POLYPECTOMY;  Surgeon: Normie Becton., MD;  Location: [facility] ENDOSCOPY;  Service: Gastroenterology;;   POLYPECTOMY  04/10/2022   Procedure: POLYPECTOMY;  Surgeon: Suzette Espy, MD;  Location: [facility];  Service: Endoscopy;;   SUBMUCOSAL LIFTING INJECTION  05/02/2020   Procedure: SUBMUCOSAL LIFTING INJECTION;  Surgeon: Normie Becton., MD;  Location: [facility]OSCOPY;  Service: Gastroenterology;;    Family History  Problem Relation Age of Onset   Cirrhosis Mother    CAD Mother 59       Premature CAD   Heart disease  Father 76       Premature CAD   Esophageal cancer Brother    Unexplained death Brother 55       died in his sleep   Colon cancer Neg Hx    Inflammatory bowel disease Neg Hx    Liver disease Neg Hx    Pancreatic cancer Neg Hx    Stomach cancer Neg Hx     Social History:  reports that he quit smoking about 4 years ago. His smoking use included cigarettes. He started smoking about 44 years ago. He has a 10 pack-year smoking history. He has never been exposed to tobacco smoke. He has never used smokeless tobacco. He reports current alcohol use of about 3.0 - 4.0 standard drinks of alcohol per week. He reports that he does not use drugs.  Allergies: No Known Allergies  Prior to Admission medications   Medication Sig Start Date End Date Taking? Authorizing Provider  ALPRAZolam  (XANAX ) 1 MG tablet Take 2 mg by mouth at bedtime.  07/25/16  Yes [provider]  aspirin  EC 81 MG tablet Take 1 tablet (81 mg total) by mouth daily. Swallow whole. 11/20/23  Yes Sanjuanita Cruz, NP  aspirin -sod bicarb-citric acid (ALKA-SELTZER) 325 MG TBEF tablet Take 650 mg by mouth every 6 (six) hours as needed (indigestion).   Yes [provider]  Biotin  800 MCG TABS Take by  mouth daily.   Yes [provider]  clopidogrel  (PLAVIX ) 75 MG tablet Take 1 tablet (75 mg total) by mouth daily. 03/22/24  Yes Gerard Knight, MD  Evolocumab  (REPATHA  SURECLICK) 140 MG/ML SOAJ Inject 1 Dose into the skin every 14 (fourteen) days. 05/08/23  Yes Gerard Knight, MD  fenofibrate  160 MG tablet Take 160 mg by mouth at bedtime.    Yes [provider]  finasteride (PROSCAR) 5 MG tablet Take 5 mg by mouth daily. 02/17/24  Yes [provider]  furosemide  (LASIX ) 40 MG tablet Take 1 tablet (40 mg total) by mouth daily for 3 days, THEN 1 tablet (40 mg total) daily as needed. 03/11/24 03/09/25 Yes Gerard Knight, MD  HYDROcodone -acetaminophen  (NORCO) 10-325 MG tablet Take 1 tablet by mouth  every 6 (six) hours as needed (for pain). 07/11/16  Yes [provider]  magnesium  oxide (MAG-OX) 400 MG tablet Take 400 mg by mouth daily.   Yes [provider]  metoprolol  succinate (TOPROL -XL) 25 MG 24 hr tablet TAKE 1 TABLET BY MOUTH ONCE DAILY. 11/09/23  Yes Gerard Knight, MD  nitroGLYCERIN  (NITROSTAT ) 0.4 MG SL tablet Place 1 tablet (0.4 mg total) under the tongue every 5 (five) minutes as needed for chest pain. 11/20/23  Yes Sanjuanita Cruz, NP  omeprazole  (PRILOSEC) 40 MG capsule Take 40 mg by mouth 2 (two) times daily.   Yes [provider]  potassium chloride  (KLOR-CON ) 10 MEQ tablet Take 1 tablet (10 mEq total) by mouth daily for 3 days, THEN 1 tablet (10 mEq total) daily as needed. 03/11/24 03/09/25 Yes Gerard Knight, MD  Probiotic Product (ALIGN) 4 MG CAPS Take 4 mg by mouth daily.   Yes [provider]  rosuvastatin  (CRESTOR ) 10 MG tablet Take 1 tablet (10 mg total) by mouth daily. 09/03/23  Yes Gerard Knight, MD  traZODone  (DESYREL ) 50 MG tablet Take 50 mg by mouth at bedtime. 12/18/22  Yes [provider]  vitamin B-12 (CYANOCOBALAMIN) 500 MCG tablet Take 500 mcg by mouth daily.   Yes [provider]  Vitamin D-Vitamin K (VITAMIN K2-VITAMIN D3 PO) Take 1 capsule by mouth daily.   Yes [provider]  zinc  gluconate 50 MG tablet Take 50 mg by mouth daily.   Yes [provider]    Blood pressure (!) 153/87, pulse 60, height 6\' 3"  (1.905 m), weight 210 lb (95.3 kg), SpO2 96%. Exam: General: Communicates without difficulty, well nourished, no acute distress. Head: Normocephalic, no evidence injury, no tenderness, facial buttresses intact without stepoff. Face/sinus: No tenderness to palpation and percussion. Facial movement is normal and symmetric. Eyes: PERRL, EOMI. No scleral icterus, conjunctivae clear. Neuro: CN II exam reveals vision grossly intact.  No nystagmus at any point of gaze. Ears: Auricles well  formed without lesions.  Ear canals are intact without mass or lesion.  No erythema or edema is appreciated.  The TMs are intact without fluid. Nose: External evaluation reveals normal support and skin without lesions.  Dorsum is intact.  Anterior rhinoscopy reveals multiple small hypervascular areas.  No bleeding is noted.  Oral:  Oral cavity and oropharynx are intact, symmetric, without erythema or edema.  Mucosa is moist without lesions. Neck: Full range of motion without pain.  There is no significant lymphadenopathy.  No masses palpable.  Thyroid bed within normal limits to palpation.  Parotid glands and submandibular glands equal bilaterally without mass.  Trachea is midline. Neuro:  CN 2-12 grossly intact.  Assessment: 1.  Recent bilateral epistaxis. 2.  Multiple small hypervascular areas are noted on his nasal septum bilaterally.  However, the patient has not had any significant bleeding over the past 2 months. 3.  No suspicious mass or lesion is noted.  Plan: 1.  The physical exam findings are reviewed with the patient. 2.  The treatment options are discussed.  Options include conservative observation versus intervention with nasal cautery.  The risk, benefits, and details of the procedure are reviewed. 3.  The patient would like to proceed with conservative observation for now. 4.  Humidifier and nasal ointment as needed. 5.  The patient is encouraged to call with any questions or concerns.  Christobal Morado W Stasia Somero 04/28/2024, 10:43 AM

## 2024-04-28 NOTE — Assessment & Plan Note (Addendum)
 Quit smoking ? Around early 2000s (inconsistent hx)/MM - onset around 2022 assoc with severe rhinitis since spring 2024 and overt GERD - 01/13/2024   Walked on RA  x  3  lap(s) =  approx 450  ft  @ rapid pace, stopped due to end of study s sob/ cp  with lowest 02 sats 96%   -Allergy screen 01/13/24   Eos 0.1 /  IgE  156   alpha one AT  MM / 167 - PFT's  02/23/24   FEV1 3.58 (83 % ) ratio 0.68  p 9 % improvement from saba p 0 prior to study with DLCO  25.42 (79%)   and FV curve  truncated insp > exp     Copd is too mild to indicate need for any rx x perhaps a saba during flares/ uri's should this develop.  Re f/v loop:  The insp portion is more impressive so this is likely a variable extrathoracic obst  - though I can't hear any stidor today this def indicates need for laryngoscopy looking at the entire extrathoracic trachea > referred to [facility] ent  In meantime I rec regular sub max ex and cpst if ent re-reval is not fruitful and continue max gerd rx   Discussed in detail all the  indications, usual  risks and alternatives  relative to the benefits with patient who agrees to proceed with w/u as outlined.            Each maintenance medication was reviewed in detail including emphasizing most importantly the difference between maintenance and prns and under what circumstances the prns are to be triggered using an action plan format where appropriate.  Total time for H and P, chart review, counseling,  and generating customized AVS unique to this office visit / same day charting = 31 min summary final f/u ov

## 2024-04-28 NOTE — Telephone Encounter (Signed)
 I think it is ok if he waits until after his 06/2024 appt with ENT to evaluate his throat UNLESS he starts having sensation that food is getting stuck in the chest area with swallowing. If he is already having this feeling more than occasionally, then we can move forward with EGD at any time.

## 2024-04-28 NOTE — Telephone Encounter (Signed)
 Pt called stating that he went to the ENT yesterday for the referral that was placed by his PCP. The appt yesterday only focused on the epistaxis and not his throat. He was given an appt for July 21st to speak to someone there regarding his throat. He is wanting to know if you think he is ok to wait until then or do you want to have him do another EGD in the meantime.

## 2024-04-28 NOTE — Patient Instructions (Signed)
 1) My office will be contacting you by phone for referral to [facility]  - if you don't hear back from my office within one week please call us  back or notify us  thru MyChart and we'll address it right away.   2) To get the most out of exercise, you need to be continuously aware that you are short of breath, but never out of breath, for at least 30 minutes daily. As you improve, it will actually be easier for you to do the same amount of exercise  in  30 minutes so always push to the level where you are short of breath.   3) No pulmonary follow up needed but if we still don't have an answer we can schedule a CPST in the [place] office but only if you have been doing step 2 regularly

## 2024-05-05 NOTE — Telephone Encounter (Signed)
 Pt called back stating that his throat is getting worse and he wants to go ahead a proceed with having the EGD done before seeing the ENT.

## 2024-05-05 NOTE — Telephone Encounter (Signed)
 Ok to schedule EGD/ED with [facility]. ASA 3, room 1,2 ok.

## 2024-05-05 NOTE — Telephone Encounter (Signed)
 LMOVM to return call   Pt returned call

## 2024-05-05 NOTE — Telephone Encounter (Signed)
 LMOVM to return call.

## 2024-05-06 ENCOUNTER — Encounter: Payer: Self-pay | Admitting: *Deleted

## 2024-05-06 ENCOUNTER — Ambulatory Visit (HOSPITAL_COMMUNITY)
Admission: RE | Admit: 2024-05-06 | Discharge: 2024-05-06 | Disposition: A | Discharge status: Home / Self Care | Source: Ambulatory Visit | Attending: Internal Medicine | Admitting: Internal Medicine

## 2024-05-06 DIAGNOSIS — M79605 Pain in left leg: Secondary | ICD-10-CM | POA: Diagnosis present

## 2024-05-06 DIAGNOSIS — M79604 Pain in right leg: Secondary | ICD-10-CM | POA: Diagnosis present

## 2024-05-06 NOTE — Telephone Encounter (Signed)
 Pt has been scheduled for 05/13/24. Instructions sent via mychart

## 2024-05-10 NOTE — Telephone Encounter (Signed)
 [employer] PA: Authorization Number: [id number] Case Number: [id number] Review Date: 05/10/2024 9:41:04 AM Expiration Date: 08/08/2024 Status: Your case has been Approved.

## 2024-05-11 ENCOUNTER — Encounter (HOSPITAL_COMMUNITY)
Admission: RE | Admit: 2024-05-11 | Discharge: 2024-05-11 | Disposition: A | Discharge status: Home / Self Care | Source: Ambulatory Visit | Attending: Internal Medicine | Admitting: Internal Medicine

## 2024-05-13 ENCOUNTER — Encounter (HOSPITAL_COMMUNITY)
Admission: RE | Disposition: A | Discharge status: Home / Self Care | Payer: Self-pay | Source: Home / Self Care | Attending: Internal Medicine

## 2024-05-13 ENCOUNTER — Ambulatory Visit (HOSPITAL_COMMUNITY): Admitting: Certified Registered Nurse Anesthetist

## 2024-05-13 ENCOUNTER — Ambulatory Visit (HOSPITAL_BASED_OUTPATIENT_CLINIC_OR_DEPARTMENT_OTHER): Admitting: Certified Registered Nurse Anesthetist

## 2024-05-13 ENCOUNTER — Encounter (HOSPITAL_COMMUNITY): Payer: Self-pay | Admitting: Internal Medicine

## 2024-05-13 ENCOUNTER — Ambulatory Visit (HOSPITAL_COMMUNITY)
Admission: RE | Admit: 2024-05-13 | Discharge: 2024-05-13 | Disposition: A | Discharge status: Home / Self Care | Attending: Internal Medicine | Admitting: Internal Medicine

## 2024-05-13 DIAGNOSIS — I25119 Atherosclerotic heart disease of native coronary artery with unspecified angina pectoris: Secondary | ICD-10-CM | POA: Insufficient documentation

## 2024-05-13 DIAGNOSIS — Z87891 Personal history of nicotine dependence: Secondary | ICD-10-CM | POA: Insufficient documentation

## 2024-05-13 DIAGNOSIS — K449 Diaphragmatic hernia without obstruction or gangrene: Secondary | ICD-10-CM | POA: Insufficient documentation

## 2024-05-13 DIAGNOSIS — F319 Bipolar disorder, unspecified: Secondary | ICD-10-CM | POA: Diagnosis not present

## 2024-05-13 DIAGNOSIS — Z8711 Personal history of peptic ulcer disease: Secondary | ICD-10-CM | POA: Diagnosis not present

## 2024-05-13 DIAGNOSIS — J449 Chronic obstructive pulmonary disease, unspecified: Secondary | ICD-10-CM | POA: Diagnosis not present

## 2024-05-13 DIAGNOSIS — K219 Gastro-esophageal reflux disease without esophagitis: Secondary | ICD-10-CM | POA: Diagnosis not present

## 2024-05-13 DIAGNOSIS — R1310 Dysphagia, unspecified: Secondary | ICD-10-CM | POA: Insufficient documentation

## 2024-05-13 DIAGNOSIS — F419 Anxiety disorder, unspecified: Secondary | ICD-10-CM | POA: Insufficient documentation

## 2024-05-13 DIAGNOSIS — I10 Essential (primary) hypertension: Secondary | ICD-10-CM | POA: Diagnosis not present

## 2024-05-13 DIAGNOSIS — K2270 Barrett's esophagus without dysplasia: Secondary | ICD-10-CM | POA: Diagnosis not present

## 2024-05-13 DIAGNOSIS — F1021 Alcohol dependence, in remission: Secondary | ICD-10-CM | POA: Diagnosis not present

## 2024-05-13 HISTORY — PX: ESOPHAGEAL DILATION: SHX303

## 2024-05-13 HISTORY — PX: ESOPHAGOGASTRODUODENOSCOPY: SHX5428

## 2024-05-13 SURGERY — EGD (ESOPHAGOGASTRODUODENOSCOPY)
Anesthesia: General

## 2024-05-13 MED ORDER — STERILE WATER FOR IRRIGATION IR SOLN
Status: DC | PRN
  Administered 2024-05-13: 60 mL

## 2024-05-13 MED ORDER — LACTATED RINGERS IV SOLN: INTRAVENOUS | Status: DC

## 2024-05-13 MED ORDER — PROPOFOL 500 MG/50ML IV EMUL
INTRAVENOUS | Status: DC | PRN
  Administered 2024-05-13: 80 mg via INTRAVENOUS
  Administered 2024-05-13: 50 mg via INTRAVENOUS
  Administered 2024-05-13: 40 mg via INTRAVENOUS
  Administered 2024-05-13: 50 mg via INTRAVENOUS
  Administered 2024-05-13: 125 ug/kg/min via INTRAVENOUS

## 2024-05-13 MED ORDER — LIDOCAINE HCL (CARDIAC) PF 100 MG/5ML IV SOSY
PREFILLED_SYRINGE | INTRAVENOUS | Status: DC | PRN
  Administered 2024-05-13: 60 mg via INTRAVENOUS

## 2024-05-13 MED ORDER — PROPOFOL 500 MG/50ML IV EMUL
INTRAVENOUS | Status: AC
  Filled 2024-05-13: qty 50

## 2024-05-13 MED ORDER — PHENYLEPHRINE 80 MCG/ML (10ML) SYRINGE FOR IV PUSH (FOR BLOOD PRESSURE SUPPORT)
PREFILLED_SYRINGE | INTRAVENOUS | Status: DC | PRN
Start: 2024-05-13 — End: 2024-05-13
  Administered 2024-05-13: 80 ug via INTRAVENOUS

## 2024-05-13 MED ORDER — GLYCOPYRROLATE PF 0.2 MG/ML IJ SOSY
PREFILLED_SYRINGE | INTRAMUSCULAR | Status: DC | PRN
  Administered 2024-05-13 (×2): .1 mg via INTRAVENOUS

## 2024-05-13 NOTE — Anesthesia Postprocedure Evaluation (Signed)
 Anesthesia Post Note  Patient: Paul Arias  Procedure(s) Performed: EGD (ESOPHAGOGASTRODUODENOSCOPY) DILATION, ESOPHAGUS  Patient location during evaluation: Phase II Anesthesia Type: General Level of consciousness: awake Pain management: pain level controlled Vital Signs Assessment: post-procedure vital signs reviewed and stable Respiratory status: spontaneous breathing and respiratory function stable Cardiovascular status: blood pressure returned to baseline and stable Postop Assessment: no headache and no apparent nausea or vomiting Anesthetic complications: no Comments: Late entry   No notable events documented.   Last Vitals:  Vitals:   05/13/24 1115 05/13/24 1403  BP:  135/66  Pulse:  (!) 56  Resp:  13  Temp:  36.4 C  SpO2: 96% 95%    Last Pain:  Vitals:   05/13/24 1405  TempSrc:   PainSc: 0-No pain                 Coretha Dew

## 2024-05-13 NOTE — H&P (Signed)
 @LOGO @   Primary Care Physician:  Omie Bickers, MD Primary Gastroenterologist:  Dr. Riley Cheadle  Pre-Procedure History & Physical: HPI:  Paul Arias is a 57 y.o. male here for further evaluation of esophageal dysphagia.  History of Barrett's esophagus.  History of alcohol dependence now in remission.  Past Medical History:  Diagnosis Date   Alcohol dependence in remission [facility])    Allergic rhinitis    Arthritis    Bipolar disorder (HCC)    CAD (coronary artery disease)    Cardiac catheterization May 2021 - medical therapy recommended   Chronic pain    Colon polyp    Tubular adenoma   COPD (chronic obstructive pulmonary disease) (HCC)    Gastric polyp    Tubular adenoma   GERD (gastroesophageal reflux disease)    Hypertension    Mixed hyperlipidemia     Past Surgical History:  Procedure Laterality Date   BIOPSY  02/09/2020   Procedure: BIOPSY;  Surgeon: Suzette Espy, MD;  Location: [facility];  Service: Endoscopy;;   BIOPSY  12/17/2020   Procedure: BIOPSY;  Surgeon: Normie Becton., MD;  Location: [facility] ENDOSCOPY;  Service: Gastroenterology;;   BIOPSY  04/10/2022   Procedure: BIOPSY;  Surgeon: Suzette Espy, MD;  Location: [facility];  Service: Endoscopy;;   CATARACT EXTRACTION W/PHACO Bilateral    COLONOSCOPY N/A 08/19/2016   Dr. Alanda Allegra: 2 semi-pedunculated polyps in the distal sigmoid colon removed. Measure 2-5 mm. Tubular adenomas.   COLONOSCOPY WITH PROPOFOL  N/A 02/09/2020   7 polyps (tubular adenomas and hyperplastic)with recommended 5-year repeat (2026).   COLONOSCOPY WITH PROPOFOL  N/A 04/10/2022   Procedure: COLONOSCOPY WITH PROPOFOL ;  Surgeon: Suzette Espy, MD;  Location: [facility];  Service: Endoscopy;  Laterality: N/A;  2:00pm   CORONARY PRESSURE/FFR STUDY N/A 11/20/2023   Procedure: CORONARY PRESSURE/FFR STUDY;  Surgeon: Wenona Hamilton, MD;  Location: [facility] INVASIVE CV LAB;  Service: Cardiovascular;  Laterality: N/A;   CORONARY STENT  INTERVENTION N/A 11/20/2023   Procedure: CORONARY STENT INTERVENTION;  Surgeon: Wenona Hamilton, MD;  Location: [facility] INVAS[facility] CV LAB;  Service: Cardiovascular;  Laterality: N/A;   ENDOSCOPIC MUCOSAL RESECTION N/A 05/02/2020   Procedure: ENDOSCOPIC MUCOSAL RESECTION;  Surgeon: Brice Campi Albino Alu., MD;  Location: [facility] ENDOSCOPY;  Service: Gastroenterology;  Laterality: N/A;   ESOPHAGOGASTRODUODENOSCOPY N/A 08/19/2016   Dr. Larrie Po: Villous appearing mucosa in the first portion of the duodenum, polyp removed with hot snare, pathology tubular adenoma. Medium sized hiatal hernia. Biopsy for CLOtest which was negative.   ESOPHAGOGASTRODUODENOSCOPY (EGD) WITH PROPOFOL   08/20/2016   Dr. Riley Cheadle: assisted Dr. Larrie Po emergently. Esophagus normal. Medium sized hiatal hernia. One nonbleeding cratered stellate-shaped gastric ulcer measuring 8 mm. One cratered ulcerated area with adherent clot in the second portion of the duodenum. 25 mm in diameter. Visible vessel seen. Status post bleeding control therapy. Heaped up margins consistent with incomplete removal.    ESOPHAGOGASTRODUODENOSCOPY (EGD) WITH PROPOFOL  N/A 10/27/2016   PROPOFOL ;  Surgeon: Suzette Espy, MD; Barrett's esophagus without dysplasia or malignancy.  Normal stomach with previous gastric ulcer healed.  2 duodenal polyps revealing fragments of small bowel adenoma, otherwise normal.  Recommendations to repeat endoscopy in 1 year.   ESOPHAGOGASTRODUODENOSCOPY (EGD) WITH PROPOFOL  N/A 02/09/2020   probable short segment Barrett's s/p biopsy, multiple adenomatous appearing duodenal lesions s/p biopsy.   ESOPHAGOGASTRODUODENOSCOPY (EGD) WITH PROPOFOL  N/A 05/02/2020   small hiatal hernia, mucosal nodule in duodenum with complete removal s/p piecemeal mucosal resection and avulsion  s/p clips.   ESOPHAGOGASTRODUODENOSCOPY (EGD) WITH PROPOFOL  N/A 12/17/2020   duodenal scar from prior EMR, small are of possible recurrence s/p removal. One fragment of  duodenal adenoma. Needs surveillance EGD Jan 2023.   ESOPHAGOGASTRODUODENOSCOPY (EGD) WITH PROPOFOL  N/A 04/10/2022   Procedure: ESOPHAGOGASTRODUODENOSCOPY (EGD) WITH PROPOFOL ;  Surgeon: Suzette Espy, MD;  Location: [facility];  Service: Endoscopy;  Laterality: N/A;   FLEXIBLE SIGMOIDOSCOPY N/A 08/20/2016   Dr. Larrie Po: Hematin found in mid sigmoid colon. One hemostatic clip placed.   HEMOSTASIS CLIP PLACEMENT  05/02/2020   Procedure: HEMOSTASIS CLIP PLACEMENT;  Surgeon: Normie Becton., MD;  Location: [facility];  Service: Gastroenterology;;   LEFT HEART CATH AND CORONARY ANGIOGRAPHY N/A 04/11/2020   Procedure: LEFT HEART CATH AND CORONARY ANGIOGRAPHY;  Surgeon: Arnoldo Lapping, MD;  Location: [facility] INVASIVE CV LAB;  Service: Cardiovascular;  Laterality: N/A;   LEFT HEART CATH AND CORONARY ANGIOGRAPHY N/A 11/20/2023   Procedure: LEFT HEART CATH AND CORONARY ANGIOGRAPHY;  Surgeon: Wenona Hamilton, MD;  Location: [facility];  Service: Cardiovascular;  Laterality: N/A;   POLYPECTOMY  02/09/2020   Procedure: POLYPECTOMY;  Surgeon: Suzette Espy, MD;  [facility]: [facility];  Service: Endoscopy;;   POLYPECTOMY  12/17/2020   Procedure: POLYPECTOMY;  Surgeon: Normie Becton., MD;  Location: [facility] ENDOSCOPY;  Service: Gastroenterology;;   POLYPECTOMY  04/10/2022   Procedure: POLYPECTOMY;  Surgeon: Suzette Espy, MD;  Location: [facility];  Service: Endoscopy;;   SUBMUCOSAL LIFTING INJECTION  05/02/2020   Procedure: SUBMUCOSAL LIFTING INJECTION;  Surgeon: Normie Becton., MD;  Location: [facility];  Service: Gastroenterology;;    Prior to Admission medications   Medication Sig Start Date End Date Taking? Authorizing Provider  ALPRAZolam  (XANAX ) 1 MG tablet Take 2 mg by mouth at bedtime.  07/25/16  Yes [provider]  aspirin  EC 81 MG tablet Take 1 tablet (81 mg total) by mouth daily. Swallow whole. 11/20/23  Yes Sanjuanita Cruz, NP  Biotin  800 MCG  TABS Take by mouth daily.   Yes [provider]  clopidogrel  (PLAVIX ) 75 MG tablet Take 1 tablet (75 mg total) by mouth daily. 03/22/24  Yes Gerard Knight, MD  fenofibrate  160 MG tablet Take 160 mg by mouth at bedtime.    Yes [provider]  finasteride (PROSCAR) 5 MG tablet Take 5 mg by mouth daily. 02/17/24  Yes [provider]  furosemide  (LASIX ) 40 MG tablet Take 1 tablet (40 mg total) by mouth daily for 3 days, THEN 1 tablet (40 mg total) daily as needed. 03/11/24 03/09/25 Yes Gerard Knight, MD  HYDROcodone -acetaminophen  (NORCO) 10-325 MG tablet Take 1 tablet by mouth every 6 (six) hours as needed (for pain). 07/11/16  Yes [provider]  magnesium  oxide (MAG-OX) 400 MG tablet Take 400 mg by mouth daily.   Yes [provider]  metoprolol  succinate (TOPROL -XL) 25 MG 24 hr tablet TAKE 1 TABLET BY MOUTH ONCE DAILY. 11/09/23  Yes Gerard Knight, MD  omeprazole  (PRILOSEC) 40 MG capsule Take 40 mg by mouth 2 (two) times daily.   Yes [provider]  potassium chloride  (KLOR-CON ) 10 MEQ tablet Take 1 tablet (10 mEq total) by mouth daily for 3 days, THEN 1 tablet (10 mEq total) daily as needed. 03/11/24 03/09/25 Yes Gerard Knight, MD  Probiotic Product (ALIGN) 4 MG CAPS Take 4 mg by mouth daily.   Yes [provider]  rosuvastatin  (CRESTOR ) 10 MG tablet Take  1 tablet (10 mg total) by mouth daily. 09/03/23  Yes Gerard Knight, MD  traZODone  (DESYREL ) 50 MG tablet Take 50 mg by mouth at bedtime. 12/18/22  Yes [provider]  vitamin B-12 (CYANOCOBALAMIN) 500 MCG tablet Take 500 mcg by mouth daily.   Yes [provider]  Vitamin D-Vitamin K (VITAMIN K2-VITAMIN D3 PO) Take 1 capsule by mouth daily.   Yes [provider]  zinc  gluconate 50 MG tablet Take 50 mg by mouth daily.   Yes [provider]  aspirin -sod bicarb-citric acid (ALKA-SELTZER) 325 MG TBEF tablet Take 650 mg by mouth every 6 (six)  hours as needed (indigestion).    [provider]  Evolocumab  (REPATHA  SURECLICK) 140 MG/ML SOAJ Inject 1 Dose into the skin every 14 (fourteen) days. 05/08/23   Gerard Knight, MD  nitroGLYCERIN  (NITROSTAT ) 0.4 MG SL tablet Place 1 tablet (0.4 mg total) under the tongue every 5 (five) minutes as needed for chest pain. 11/20/23   Sanjuanita Cruz, NP    Allergies as of 05/06/2024   (No Known Allergies)    Family History  Problem Relation Age of Onset   Cirrhosis Mother    CAD Mother 38       Premature CAD   Heart disease Father 21       Premature CAD   Esophageal cancer Brother    Unexplained death Brother 2       died in his sleep   Colon cancer Neg Hx    Inflammatory bowel disease Neg Hx    Liver disease Neg Hx    Pancreatic cancer Neg Hx    Stomach cancer Neg Hx     Social History   Socioeconomic History   Marital status: Single    Spouse name: Not on file   Number of children: 2   Years of education: Not on file   Highest education level: Not on file  Occupational History   Not on file  Tobacco Use   Smoking status: Former    Current packs/day: 0.00    Average packs/day: 0.3 packs/day for 40.0 years (10.0 ttl pk-yrs)    Types: Cigarettes    Start date: 09/08/1979    Quit date: 09/08/2019    Years since quitting: 4.6    Passive exposure: Never   Smokeless tobacco: Never  Vaping Use   Vaping status: Never Used  Substance and Sexual Activity   Alcohol use: Yes    Alcohol/week: 3.0 - 4.0 standard drinks of alcohol    Types: 3 - 4 Cans of beer per week    Comment: daily   Drug use: No   Sexual activity: Not Currently    Birth control/protection: None  Other Topics Concern   Not on file  Social History Narrative   Not on file   Social Drivers of Health   Financial Resource Strain: Not on file  Food Insecurity: Not on file  Transportation Needs: Not on file  Physical Activity: Not on file  Stress: Not on file  Social Connections: Not on  file  Intimate Partner Violence: Not on file    Review of Systems: See HPI, otherwise negative ROS  Physical Exam: BP 130/84 (BP Location: Right Arm)   Pulse 68   Temp 98.4 F (36.9 C)   Resp 14   SpO2 96%  General:   Alert,  Well-developed, well-nourished, pleasant and cooperative in NAD Lungs:  Clear throughout to auscultation.   No wheezes, crackles, or rhonchi.  No acute distress. Heart:  Regular rate and rhythm; no murmurs, clicks, rubs,  or gallops. Abdomen: Non-distended, normal bowel sounds.  Soft and nontender without appreciable mass or hepatosplenomegaly.   Impression/Plan:    58 year old gentleman with esophageal dysphagia history of short segment Barrett's esophagus here for EGD with esophageal dilation as feasible/appropriate per plan.  History of duodenal adenoma status post EMR in the past.The risks, benefits, limitations, alternatives and imponderables have been reviewed with the patient. Potential for esophageal dilation, biopsy, etc. have also been reviewed.  Questions have been answered. All parties agreeable.      Notice: This dictation was prepared with Dragon dictation along with smaller phrase technology. Any transcriptional errors that result from this process are unintentional and may not be corrected upon review.

## 2024-05-13 NOTE — Op Note (Signed)
 [facility] Patient Name: Paul Arias Procedure Date: 05/13/2024 1:13 PM MRN: [id number] Date of Birth: [birth year] Attending MD: Gemma Kelp , MD, [id number] CSN: [id number] Age: 58 Admit Type: Outpatient Procedure:                Upper GI endoscopy Indications:              Dysphagia Providers:                Gemma Kelp, MD, Annell Barrow, Vonna Guardian Referring MD:              Medicines:                Propofol  per Anesthesia Complications:            No immediate complications. Estimated Blood Loss:     Estimated blood loss was minimal. Procedure:                Pre-Anesthesia Assessment:                           - Prior to the procedure, a History and Physical                            was performed, and patient medications and                            allergies were reviewed. The patient's tolerance of                            previous anesthesia was also reviewed. The risks                            and benefits of the procedure and the sedation                            options and risks were discussed with the patient.                            All questions were answered, and informed consent                            was obtained. Prior Anticoagulants: The patient has                            taken no anticoagulant or antiplatelet agents. ASA                            Grade Assessment: III - A patient with severe                            systemic disease. After reviewing the risks and  benefits, the patient was deemed in satisfactory                            condition to undergo the procedure.                           After obtaining informed consent, the endoscope was                            passed under direct vision. Throughout the                            procedure, the patient's blood pressure, pulse, and                            oxygen saturations were monitored  continuously. The                            GIF-H190 ([id number]) scope was introduced through the                            mouth, and advanced to the second part of duodenum.                            The upper GI endoscopy was accomplished without                            difficulty. The patient tolerated the procedure                            well. Scope In: 1:50:32 PM Scope Out: 1:59:01 PM Total Procedure Duration: 0 hours 8 minutes 29 seconds  Findings:      3 mm "island" of salmon-colored epithelium just above the GE junction       and a 1.5 cm "tongue" of salmon-colored epithelium. No nodularity no       tumor no esophagitis. Appeared entirely unchanged from that seen       previously      A small hiatal hernia was present. Gastric mucosa appeared normal.      The duodenal bulb and second portion of the duodenum were normal. Scope       was withdrawn. A 54 French Maloney dilator was passed to full insertion       easily. Subsequently, a 54 French Maloney dilator was passed to full       insertion with mild resistance. A look back revealed no apparent       complication there is no disruption or any heme production. Finally, the       tongue and [place] of Barrett's was biopsied. Impression:               - Unchanged short segment Barrett's esophagus.                            Status post Londa Rival dilation followed by biopsy                           -  Small hiatal hernia.                           - Normal duodenal bulb and second portion of the                            duodenum. Moderate Sedation:      Moderate (conscious) sedation was personally administered by an       anesthesia professional. The following parameters were monitored: oxygen       saturation, heart rate, blood pressure, respiratory rate, EKG, adequacy       of pulmonary ventilation, and response to care. Recommendation:           - Patient has a contact number available for                             emergencies. The signs and symptoms of potential                            delayed complications were discussed with the                            patient. Return to normal activities tomorrow.                            Written discharge instructions were provided to the                            patient.                           - Advance diet as tolerated. Follow-up on                            pathology. Office visit with us  in 3 months.                            Continue present medication regimen Procedure Code(s):        --- Professional ---                           [phone], Esophagogastroduodenoscopy, flexible,                            transoral; diagnostic, including collection of                            specimen(s) by brushing or washing, when performed                            (separate procedure) Diagnosis Code(s):        --- Professional ---                           K44.9, Diaphragmatic hernia without obstruction or  gangrene                           R13.10, Dysphagia, unspecified CPT copyright 2022 American Medical Association. All rights reserved. The codes documented in this report are preliminary and upon coder review may  be revised to meet current compliance requirements. Windsor Hatcher. Keri Tavella, MD Gemma Kelp, MD 05/13/2024 5:95:63 PM This report has been signed electronically. Number of Addenda: 0

## 2024-05-13 NOTE — Transfer of Care (Signed)
 Immediate Anesthesia Transfer of Care Note  Patient: Paul Arias  Procedure(s) Performed: EGD (ESOPHAGOGASTRODUODENOSCOPY) DILATION, ESOPHAGUS  Patient Location: Short Stay  Anesthesia Type:General  Level of Consciousness: drowsy and patient cooperative  Airway & Oxygen Therapy: Patient Spontanous Breathing  Post-op Assessment: Report given to RN and Post -op Vital signs reviewed and stable  Post vital signs: Reviewed and stable  Last Vitals:  Vitals Value Taken Time  BP 135/66 05/13/24 1403  Temp 36.4 C 05/13/24 1403  Pulse 56 05/13/24 1403  Resp 13 05/13/24 1403  SpO2 95 % 05/13/24 1403    Last Pain:  Vitals:   05/13/24 1403  TempSrc: Axillary  PainSc:          Complications: No notable events documented.

## 2024-05-13 NOTE — Anesthesia Preprocedure Evaluation (Signed)
 Anesthesia Evaluation  Patient identified by MRN, date of birth, ID band Patient awake    Reviewed: Allergy & Precautions, H&P , NPO status , Patient's Chart, lab work & pertinent test results, reviewed documented beta blocker date and time   Airway Mallampati: II  TM Distance: >3 FB Neck ROM: full    Dental no notable dental hx.    Pulmonary neg pulmonary ROS, COPD, former smoker   Pulmonary exam normal breath sounds clear to auscultation       Cardiovascular Exercise Tolerance: Good hypertension, + angina  + CAD and + DOE  negative cardio ROS  Rhythm:regular Rate:Normal     Neuro/Psych  PSYCHIATRIC DISORDERS Anxiety  Bipolar Disorder   negative neurological ROS  negative psych ROS   GI/Hepatic negative GI ROS, Neg liver ROS, PUD,GERD  ,,  Endo/Other  negative endocrine ROS    Renal/GU negative Renal ROS  negative genitourinary   Musculoskeletal   Abdominal   Peds  Hematology negative hematology ROS (+) Blood dyscrasia, anemia   Anesthesia Other Findings   Reproductive/Obstetrics negative OB ROS                             Anesthesia Physical Anesthesia Plan  ASA: 3  Anesthesia Plan: General   Post-op Pain Management:    Induction:   PONV Risk Score and Plan: Propofol  infusion  Airway Management Planned:   Additional Equipment:   Intra-op Plan:   Post-operative Plan:   Informed Consent: I have reviewed the patients History and Physical, chart, labs and discussed the procedure including the risks, benefits and alternatives for the proposed anesthesia with the patient or authorized representative who has indicated his/her understanding and acceptance.     Dental Advisory Given  Plan Discussed with: CRNA  Anesthesia Plan Comments:        Anesthesia Quick Evaluation

## 2024-05-13 NOTE — Discharge Instructions (Addendum)
 EGD Discharge instructions Please read the instructions outlined below and refer to this sheet in the next few weeks. These discharge instructions provide you with general information on caring for yourself after you leave the hospital. Your doctor may also give you specific instructions. While your treatment has been planned according to the most current medical practices available, unavoidable complications occasionally occur. If you have any problems or questions after discharge, please call your doctor. ACTIVITY You may resume your regular activity but move at a slower pace for the next 24 hours.  Take frequent rest periods for the next 24 hours.  Walking will help expel (get rid of) the air and reduce the bloated feeling in your abdomen.  No driving for 24 hours (because of the anesthesia (medicine) used during the test).  You may shower.  Do not sign any important legal documents or operate any machinery for 24 hours (because of the anesthesia used during the test).  NUTRITION Drink plenty of fluids.  You may resume your normal diet.  Begin with a light meal and progress to your normal diet.  Avoid alcoholic beverages for 24 hours or as instructed by your caregiver.  MEDICATIONS You may resume your normal medications unless your caregiver tells you otherwise.  WHAT YOU CAN EXPECT TODAY You may experience abdominal discomfort such as a feeling of fullness or "gas" pains.  FOLLOW-UP Your doctor will discuss the results of your test with you.  SEEK IMMEDIATE MEDICAL ATTENTION IF ANY OF THE FOLLOWING OCCUR: Excessive nausea (feeling sick to your stomach) and/or vomiting.  Severe abdominal pain and distention (swelling).  Trouble swallowing.  Temperature over 101 F (37.8 C).  Rectal bleeding or vomiting of blood.     Your esophagus appeared normal except for the small amount of Barrett's (appeared unchanged).  Nice dilation performed.  Barrett's biopsied once again  Further  recommendations to follow pending review of pathology report  Office visit with Azalee Lenz in 3 months  At patient request, I called

## 2024-05-16 ENCOUNTER — Encounter (HOSPITAL_COMMUNITY): Payer: Self-pay | Admitting: Internal Medicine

## 2024-05-17 ENCOUNTER — Ambulatory Visit: Payer: Self-pay | Admitting: Internal Medicine

## 2024-05-17 LAB — SURGICAL PATHOLOGY

## 2024-05-30 ENCOUNTER — Other Ambulatory Visit (HOSPITAL_COMMUNITY): Payer: Self-pay

## 2024-05-30 ENCOUNTER — Telehealth: Payer: Self-pay | Admitting: Cardiology

## 2024-05-30 MED ORDER — FUROSEMIDE 40 MG PO TABS
ORAL_TABLET | ORAL | 1 refills | Status: DC
  Filled 2024-05-30: qty 30, 30d supply, fill #0

## 2024-05-30 MED ORDER — FUROSEMIDE 40 MG PO TABS: ORAL_TABLET | ORAL | 1 refills | Status: AC

## 2024-05-30 NOTE — Telephone Encounter (Signed)
 I spoke with patient and his weight is 215 lbs  He has not used lasix  in a while  Refill sent in and he will take lasix  for 3 days and then call with update.   He will monitor sodium intake also

## 2024-05-30 NOTE — Telephone Encounter (Signed)
 Pt c/o swelling/edema: STAT if pt has developed SOB within 24 hours  If swelling, where is the swelling located? Right leg   How much weight have you gained and in what time span? Not sure   Have you gained 2 pounds in a day or 5 pounds in a week? Not sure   Do you have a log of your daily weights (if so, list)? Did not record   Are you currently taking a fluid pill? Yes but he is out of Furosemide  (requesting refill to [facility])   Are you currently SOB? No   Have you traveled recently in a car or plane for an extended period of time?

## 2024-05-30 NOTE — Telephone Encounter (Signed)
 Left message to return call  Refilled lasix 

## 2024-05-30 NOTE — Telephone Encounter (Signed)
 Patient is returning call.

## 2024-05-31 ENCOUNTER — Encounter (INDEPENDENT_AMBULATORY_CARE_PROVIDER_SITE_OTHER): Payer: Self-pay

## 2024-06-27 ENCOUNTER — Institutional Professional Consult (permissible substitution) (INDEPENDENT_AMBULATORY_CARE_PROVIDER_SITE_OTHER): Admitting: Otolaryngology

## 2024-07-01 ENCOUNTER — Other Ambulatory Visit: Payer: Self-pay | Admitting: Pharmacist Clinician (PhC)/ Clinical Pharmacy Specialist

## 2024-07-01 MED ORDER — REPATHA SURECLICK 140 MG/ML ~~LOC~~ SOAJ: 140.0000 mg | SUBCUTANEOUS | 3 refills | Status: AC

## 2024-07-07 ENCOUNTER — Emergency Department (HOSPITAL_COMMUNITY)
Admission: EM | Admit: 2024-07-07 | Discharge: 2024-07-07 | Disposition: A | Discharge status: Home / Self Care | Payer: Worker's Compensation | Source: Ambulatory Visit | Attending: Emergency Medicine | Admitting: Emergency Medicine

## 2024-07-07 ENCOUNTER — Emergency Department (HOSPITAL_COMMUNITY): Payer: Worker's Compensation

## 2024-07-07 ENCOUNTER — Encounter (HOSPITAL_COMMUNITY): Payer: Self-pay | Admitting: Emergency Medicine

## 2024-07-07 ENCOUNTER — Other Ambulatory Visit: Payer: Self-pay

## 2024-07-07 DIAGNOSIS — Z7982 Long term (current) use of aspirin: Secondary | ICD-10-CM | POA: Diagnosis not present

## 2024-07-07 DIAGNOSIS — W228XXA Striking against or struck by other objects, initial encounter: Secondary | ICD-10-CM | POA: Diagnosis not present

## 2024-07-07 DIAGNOSIS — S0101XA Laceration without foreign body of scalp, initial encounter: Secondary | ICD-10-CM | POA: Insufficient documentation

## 2024-07-07 DIAGNOSIS — Z23 Encounter for immunization: Secondary | ICD-10-CM | POA: Diagnosis not present

## 2024-07-07 DIAGNOSIS — S0181XA Laceration without foreign body of other part of head, initial encounter: Secondary | ICD-10-CM | POA: Diagnosis present

## 2024-07-07 LAB — CBC WITH DIFFERENTIAL/PLATELET
Abs Immature Granulocytes: 0.01 K/uL (ref 0.00–0.07)
Basophils Absolute: 0 K/uL (ref 0.0–0.1)
Basophils Relative: 1 %
Eosinophils Absolute: 0.1 K/uL (ref 0.0–0.5)
Eosinophils Relative: 4 %
HCT: 36.9 % — ABNORMAL LOW (ref 39.0–52.0)
Hemoglobin: 12.1 g/dL — ABNORMAL LOW (ref 13.0–17.0)
Immature Granulocytes: 0 %
Lymphocytes Relative: 29 %
Lymphs Abs: 0.9 K/uL (ref 0.7–4.0)
MCH: 30.3 pg (ref 26.0–34.0)
MCHC: 32.8 g/dL (ref 30.0–36.0)
MCV: 92.3 fL (ref 80.0–100.0)
Monocytes Absolute: 0.5 K/uL (ref 0.1–1.0)
Monocytes Relative: 16 %
Neutro Abs: 1.5 K/uL — ABNORMAL LOW (ref 1.7–7.7)
Neutrophils Relative %: 50 %
Platelets: 248 K/uL (ref 150–400)
RBC: 4 MIL/uL — ABNORMAL LOW (ref 4.22–5.81)
RDW: 14.1 % (ref 11.5–15.5)
WBC: 3 K/uL — ABNORMAL LOW (ref 4.0–10.5)
nRBC: 0 % (ref 0.0–0.2)

## 2024-07-07 MED ORDER — LIDOCAINE-EPINEPHRINE (PF) 1 %-1:200000 IJ SOLN
20.0000 mL | Freq: Once | INTRAMUSCULAR | Status: AC
  Administered 2024-07-07: 20 mL via INTRADERMAL
  Filled 2024-07-07: qty 30

## 2024-07-07 MED ORDER — TETANUS-DIPHTH-ACELL PERTUSSIS 5-2.5-18.5 LF-MCG/0.5 IM SUSY
0.5000 mL | PREFILLED_SYRINGE | Freq: Once | INTRAMUSCULAR | Status: AC
  Administered 2024-07-07: 0.5 mL via INTRAMUSCULAR
  Filled 2024-07-07: qty 0.5

## 2024-07-07 NOTE — ED Triage Notes (Signed)
 Pt sent by [facility]. Pt hit his head on a piece of metal under a house and cut his head. Pt went to urgent care for sutures but they were unable to do them d/t having a arterial lac. EDP aware. Pt takes blood thinners. Denies LOC

## 2024-07-07 NOTE — ED Provider Notes (Signed)
 [place] EMERGENCY DEPARTMENT AT [facility] Provider Note   CSN: [id number] Arrival date & time: 07/07/24  1031     Patient presents with: Head Laceration   Paul Arias is a 58 y.o. male.   HPI 58 year old male who is on Plavix  presents with a head laceration.  He went to urgent care and was sent here for arterial bleeding.  He was under the house and when he turned around he hit his head on a sharp piece of metal.  Unknown last tetanus immunization.  Bleeding is currently not controlled.  He denies feeling dizzy or lightheaded.  Prior to Admission medications   Medication Sig Start Date End Date Taking? Authorizing Provider  ALPRAZolam  (XANAX ) 1 MG tablet Take 2 mg by mouth at bedtime.  07/25/16   [provider]  aspirin  EC 81 MG tablet Take 1 tablet (81 mg total) by mouth daily. Swallow whole. 11/20/23   Henry Manuelita NOVAK, NP  aspirin -sod bicarb-citric acid (ALKA-SELTZER) 325 MG TBEF tablet Take 650 mg by mouth every 6 (six) hours as needed (indigestion).    [provider]  Biotin  800 MCG TABS Take by mouth daily.    [provider]  clopidogrel  (PLAVIX ) 75 MG tablet Take 1 tablet (75 mg total) by mouth daily. 03/22/24   Debera Jayson MATSU, MD  Evolocumab  (REPATHA  SURECLICK) 140 MG/ML SOAJ Inject 140 mg into the skin every 14 (fourteen) days. 07/01/24   Mona Vinie BROCKS, MD  fenofibrate  160 MG tablet Take 160 mg by mouth at bedtime.     [provider]  finasteride (PROSCAR) 5 MG tablet Take 5 mg by mouth daily. 02/17/24   [provider]  furosemide  (LASIX ) 40 MG tablet Take 1 tablet (40 mg total) by mouth daily for 3 days, THEN 1 tablet (40 mg total) daily as needed. 05/30/24 05/28/25  Debera Jayson MATSU, MD  HYDROcodone -acetaminophen  (NORCO) 10-325 MG tablet Take 1 tablet by mouth every 6 (six) hours as needed (for pain). 07/11/16   [provider]  magnesium  oxide (MAG-OX) 400 MG tablet Take 400 mg by mouth daily.     [provider]  metoprolol  succinate (TOPROL -XL) 25 MG 24 hr tablet TAKE 1 TABLET BY MOUTH ONCE DAILY. 11/09/23   Debera Jayson MATSU, MD  nitroGLYCERIN  (NITROSTAT ) 0.4 MG SL tablet Place 1 tablet (0.4 mg total) under the tongue every 5 (five) minutes as needed for chest pain. 11/20/23   Henry Manuelita NOVAK, NP  omeprazole  (PRILOSEC) 40 MG capsule Take 40 mg by mouth 2 (two) times daily.    [provider]  potassium chloride  (KLOR-CON ) 10 MEQ tablet Take 1 tablet (10 mEq total) by mouth daily for 3 days, THEN 1 tablet (10 mEq total) daily as needed. 03/11/24 03/09/25  Debera Jayson MATSU, MD  Probiotic Product (ALIGN) 4 MG CAPS Take 4 mg by mouth daily.    [provider]  rosuvastatin  (CRESTOR ) 10 MG tablet Take 1 tablet (10 mg total) by mouth daily. 09/03/23   Debera Jayson MATSU, MD  traZODone  (DESYREL ) 50 MG tablet Take 50 mg by mouth at bedtime. 12/18/22   [provider]  vitamin B-12 (CYANOCOBALAMIN) 500 MCG tablet Take 500 mcg by mouth daily.    [provider]  Vitamin D-Vitamin K (VITAMIN K2-VITAMIN D3 PO) Take 1 capsule by mouth daily.    [provider]  zinc  gluconate 50 MG tablet Take 50 mg by mouth daily.    [provider]  Allergies: Patient has no known allergies.    Review of Systems  Skin:  Positive for wound.  Neurological:  Negative for light-headedness and headaches.    Updated Vital Signs BP (!) 171/96   Pulse 67   Temp 98 F (36.7 C) (Oral)   Resp 14   Ht 6' 3 (1.905 m)   Wt 95 kg   SpO2 93%   BMI 26.18 kg/m   Physical Exam Vitals and nursing note reviewed.  Constitutional:      General: He is not in acute distress.    Appearance: He is well-developed.  HENT:     Head: Normocephalic. Laceration present.   Pulmonary:     Effort: Pulmonary effort is normal.  Skin:    General: Skin is warm and dry.  Neurological:     Mental Status: He is alert.     (all labs ordered are listed, but only  abnormal results are displayed) Labs Reviewed  CBC WITH DIFFERENTIAL/PLATELET - Abnormal; Notable for the following components:      Result Value   WBC 3.0 (*)    RBC 4.00 (*)    Hemoglobin 12.1 (*)    HCT 36.9 (*)    Neutro Abs 1.5 (*)    All other components within normal limits    EKG: None  Radiology: No results found.   .Laceration Repair  Date/Time: 07/07/2024 1:11 PM  Performed by: Freddi Hamilton, MD Authorized by: Freddi Hamilton, MD   Consent:    Consent obtained:  Verbal Universal protocol:    Patient identity confirmed:  Verbally with patient Anesthesia:    Anesthesia method:  Local infiltration   Local anesthetic:  Lidocaine  1% WITH epi Laceration details:    Location:  Scalp   Length (cm):  6 Exploration:    Limited defect created (wound extended): no     Wound exploration: entire depth of wound visualized   Treatment:    Layers/structures repaired:  Deep subcutaneous Deep subcutaneous:    Suture size:  3-0   Suture material:  Vicryl   Suture technique:  Figure eight   Number of sutures:  3 Skin repair:    Repair method:  Staples   Number of staples:  8 Approximation:    Approximation:  Close Repair type:    Repair type:  Intermediate Post-procedure details:    Procedure completion:  Tolerated well, no immediate complications    Medications Ordered in the ED  lidocaine -EPINEPHrine  (PF) (XYLOCAINE -EPINEPHrine ) 1 %-1:200000 (PF) injection 20 mL (20 mLs Intradermal Given 07/07/24 1101)  Tdap (BOOSTRIX ) injection 0.5 mL (0.5 mLs Intramuscular Given 07/07/24 1059)                                    Medical Decision Making Amount and/or Complexity of Data Reviewed Labs: ordered.    Details: Stable anemia Radiology: ordered and independent interpretation performed.    Details: No head bleed  Risk Prescription drug management.   Patient presents with scalp laceration with uncontrolled bleeding.  Appears to be from small arterials.  Due to  the bleeding it was hard to find these little bleeding vessels and so I did try to stop the bleeding acutely with some subcutaneous sutures, ultimately I decided to close the skin with staples and then a nurse tech held pressure for 25+ minutes.  This resulted in no further bleeding.  After pressure was released, he had his head CT and  results obtained and he still had no further bleeding or swelling after that.  Tdap was updated.  He appears stable for discharge, is feeling well at this time.  Will need to follow-up with PCP or urgent care for staple removal.     Final diagnoses:  None    ED Discharge Orders     None          Freddi Hamilton, MD 07/07/24 1614

## 2024-07-07 NOTE — Discharge Instructions (Addendum)
 You had 8 staples placed in your scalp today.  Keep the area clean with soap and water .  Otherwise follow-up with your doctor or urgent care in 7 days for removal.  If you develop new or worsening headache, swelling, bleeding, or any other new/concerning symptoms then return to the ER.

## 2024-07-15 ENCOUNTER — Emergency Department (HOSPITAL_COMMUNITY)
Admission: EM | Admit: 2024-07-15 | Discharge: 2024-07-15 | Disposition: A | Discharge status: Home / Self Care | Payer: Worker's Compensation | Source: Ambulatory Visit | Attending: Emergency Medicine | Admitting: Emergency Medicine

## 2024-07-15 ENCOUNTER — Other Ambulatory Visit: Payer: Self-pay

## 2024-07-15 ENCOUNTER — Encounter (HOSPITAL_COMMUNITY): Payer: Self-pay | Admitting: Emergency Medicine

## 2024-07-15 DIAGNOSIS — Z79899 Other long term (current) drug therapy: Secondary | ICD-10-CM | POA: Diagnosis not present

## 2024-07-15 DIAGNOSIS — I2510 Atherosclerotic heart disease of native coronary artery without angina pectoris: Secondary | ICD-10-CM | POA: Insufficient documentation

## 2024-07-15 DIAGNOSIS — J449 Chronic obstructive pulmonary disease, unspecified: Secondary | ICD-10-CM | POA: Insufficient documentation

## 2024-07-15 DIAGNOSIS — I10 Essential (primary) hypertension: Secondary | ICD-10-CM | POA: Insufficient documentation

## 2024-07-15 DIAGNOSIS — Z7982 Long term (current) use of aspirin: Secondary | ICD-10-CM | POA: Diagnosis not present

## 2024-07-15 DIAGNOSIS — Z4802 Encounter for removal of sutures: Secondary | ICD-10-CM | POA: Insufficient documentation

## 2024-07-15 NOTE — ED Triage Notes (Signed)
 Pt here for staple removal on head lac.

## 2024-07-15 NOTE — ED Provider Notes (Signed)
 [place] EMERGENCY DEPARTMENT AT [facility] Provider Note   CSN: [id number] Arrival date & time: 07/15/24  9185     Patient presents with: Suture / Staple Removal   Paul Arias is a 58 y.o. male on Plavix  who returns for staple removal from a head lack on 7/31.  Patient denies any recurrent bleeding.  No persistent headaches, vision changes, vomiting, extremity weakness or numbness.  No fevers or chills.    Suture / Staple Removal Pertinent negatives include no headaches.   Past Medical History:  Diagnosis Date   Alcohol dependence in remission [facility])    Allergic rhinitis    Arthritis    Bipolar disorder (HCC)    CAD (coronary artery disease)    Cardiac catheterization May 2021 - medical therapy recommended   Chronic pain    Colon polyp    Tubular adenoma   COPD (chronic obstructive pulmonary disease) (HCC)    Gastric polyp    Tubular adenoma   GERD (gastroesophageal reflux disease)    Hypertension    Mixed hyperlipidemia        Prior to Admission medications   Medication Sig Start Date End Date Taking? Authorizing Provider  ALPRAZolam  (XANAX ) 1 MG tablet Take 2 mg by mouth at bedtime.  07/25/16   [provider]  aspirin  EC 81 MG tablet Take 1 tablet (81 mg total) by mouth daily. Swallow whole. 11/20/23   Henry Manuelita NOVAK, NP  aspirin -sod bicarb-citric acid (ALKA-SELTZER) 325 MG TBEF tablet Take 650 mg by mouth every 6 (six) hours as needed (indigestion).    [provider]  Biotin  800 MCG TABS Take by mouth daily.    [provider]  clopidogrel  (PLAVIX ) 75 MG tablet Take 1 tablet (75 mg total) by mouth daily. 03/22/24   Debera Jayson MATSU, MD  Evolocumab  (REPATHA  SURECLICK) 140 MG/ML SOAJ Inject 140 mg into the skin every 14 (fourteen) days. 07/01/24   Hilty, Vinie BROCKS, MD  fenofibrate  160 MG tablet Take 160 mg by mouth at bedtime.     [provider]  finasteride (PROSCAR) 5 MG tablet Take 5 mg by mouth daily.  02/17/24   [provider]  furosemide  (LASIX ) 40 MG tablet Take 1 tablet (40 mg total) by mouth daily for 3 days, THEN 1 tablet (40 mg total) daily as needed. 05/30/24 05/28/25  Debera Jayson MATSU, MD  HYDROcodone -acetaminophen  (NORCO) 10-325 MG tablet Take 1 tablet by mouth every 6 (six) hours as needed (for pain). 07/11/16   [provider]  isosorbide  mononitrate (IMDUR ) 30 MG 24 hr tablet Take 30 mg by mouth daily. 05/25/24   [provider]  magnesium  oxide (MAG-OX) 400 MG tablet Take 400 mg by mouth daily.    [provider]  metoprolol  succinate (TOPROL -XL) 25 MG 24 hr tablet TAKE 1 TABLET BY MOUTH ONCE DAILY. 11/09/23   Debera Jayson MATSU, MD  nitroGLYCERIN  (NITROSTAT ) 0.4 MG SL tablet Place 1 tablet (0.4 mg total) under the tongue every 5 (five) minutes as needed for chest pain. 11/20/23   Henry Manuelita NOVAK, NP  omeprazole  (PRILOSEC) 40 MG capsule Take 40 mg by mouth 2 (two) times daily.    [provider]  pantoprazole  (PROTONIX ) 40 MG tablet Take 40 mg by mouth daily. 07/06/24   [provider]  potassium chloride  (KLOR-CON ) 10 MEQ tablet Take 1 tablet (10 mEq total) by mouth daily for 3 days, THEN 1 tablet (10 mEq total) daily as needed. 03/11/24 03/09/25  Debera Jayson MATSU, MD  Probiotic Product (ALIGN) 4 MG CAPS Take 4 mg by mouth daily.    [provider]  rosuvastatin  (CRESTOR ) 10 MG tablet Take 1 tablet (10 mg total) by mouth daily. 09/03/23   Debera Jayson MATSU, MD  traZODone  (DESYREL ) 50 MG tablet Take 50 mg by mouth at bedtime. 12/18/22   [provider]  vitamin B-12 (CYANOCOBALAMIN) 500 MCG tablet Take 500 mcg by mouth daily.    [provider]  Vitamin D-Vitamin K (VITAMIN K2-VITAMIN D3 PO) Take 1 capsule by mouth daily.    [provider]  zinc  gluconate 50 MG tablet Take 50 mg by mouth daily.    [provider]    Allergies: Patient has no known allergies.    Review of Systems   Neurological:  Negative for headaches.    Updated Vital Signs BP (!) 146/85   Pulse 67   Temp 98.2 F (36.8 C) (Oral)   Ht 6' 3 (1.905 m)   Wt 95 kg   SpO2 95%   BMI 26.18 kg/m   Physical Exam Vitals and nursing note reviewed.  Constitutional:      General: He is not in acute distress.    Appearance: He is well-developed.  HENT:     Head:     Comments: Well-approximated, stapled head laceration without any evidence of dehiscence, persistent bleeding, erythema, warmth or discharge. Eyes:     Conjunctiva/sclera: Conjunctivae normal.  Pulmonary:     Effort: Pulmonary effort is normal. No respiratory distress.  Musculoskeletal:        General: No swelling.     Cervical back: Neck supple.  Skin:    General: Skin is warm and dry.     Capillary Refill: Capillary refill takes less than 2 seconds.  Neurological:     General: No focal deficit present.     Mental Status: He is alert and oriented to person, place, and time.  Psychiatric:        Mood and Affect: Mood normal.     (all labs ordered are listed, but only abnormal results are displayed) Labs Reviewed - No data to display  EKG: None  Radiology: No results found.   Procedures   Medications Ordered in the ED - No data to display                                  Medical Decision Making  This patient presents to the ED with chief complaint(s) of staple removal .  The complaint involves an extensive differential diagnosis and also carries with it a high risk of complications and morbidity.   Pertinent past medical history as listed in HPI  The differential diagnosis includes  Cellulitis, intracranial hemorrhage Additional history obtained: Records reviewed Care Everywhere/External Records  Assessment and management:   Hemodynamically stable patient presenting for staple removal placed 8 days ago.  Staples removed without complication.  No evidence of infection.  Neuroexam without any gross deficits.   Does not report any persistent headache, vomiting, weakness, vision changes.  Suitable for discharge with strict return precautions and wound care instructions.  Independent ECG interpretation:  none  Independent labs interpretation:  The following labs were independently interpreted:  none  Independent visualization and interpretation of imaging: I independently visualized the following imaging with scope of interpretation limited to determining acute life threatening conditions related to emergency care: none    Consultations  obtained:   none  Disposition:   Patient will be discharged home. The patient has been appropriately medically screened and/or stabilized in the ED. I have low suspicion for any other emergent medical condition which would require further screening, evaluation or treatment in the ED or require inpatient management. At time of discharge the patient is hemodynamically stable and in no acute distress. I have discussed work-up results and diagnosis with patient and answered all questions. Patient is agreeable with discharge plan. We discussed strict return precautions for returning to the emergency department and they verbalized understanding.     Social Determinants of Health:   none  This note was dictated with voice recognition software.  Despite best efforts at proofreading, errors may have occurred which can change the documentation meaning.       Final diagnoses:  Encounter for staple removal    ED Discharge Orders     None          Donnajean Lynwood VEAR DEVONNA 07/15/24 9075    Suzette Pac, MD 07/16/24 [phone]

## 2024-07-15 NOTE — Discharge Instructions (Signed)
 If you experience persistent bleeding please apply pressure directly x 20 minutes.  If the bleeding persist please return to emergency room.  Otherwise please clean the area daily with soapy water  and return if you experience any fevers, chills, worsening pain, drainage, increased warmth or redness.

## 2024-08-17 ENCOUNTER — Other Ambulatory Visit: Payer: Self-pay | Admitting: Cardiology

## 2024-08-17 ENCOUNTER — Ambulatory Visit: Attending: Cardiology | Admitting: Cardiology

## 2024-08-17 ENCOUNTER — Encounter: Payer: Self-pay | Admitting: Cardiology

## 2024-08-17 ENCOUNTER — Other Ambulatory Visit (HOSPITAL_COMMUNITY)
Admission: RE | Admit: 2024-08-17 | Discharge: 2024-08-17 | Disposition: A | Discharge status: Home / Self Care | Source: Ambulatory Visit | Attending: Cardiology | Admitting: Cardiology

## 2024-08-17 VITALS — BP 102/62 | HR 66 | Ht 75.0 in | Wt 209.0 lb

## 2024-08-17 DIAGNOSIS — I25119 Atherosclerotic heart disease of native coronary artery with unspecified angina pectoris: Secondary | ICD-10-CM | POA: Diagnosis not present

## 2024-08-17 DIAGNOSIS — E782 Mixed hyperlipidemia: Secondary | ICD-10-CM | POA: Diagnosis not present

## 2024-08-17 DIAGNOSIS — I10 Essential (primary) hypertension: Secondary | ICD-10-CM | POA: Diagnosis not present

## 2024-08-17 DIAGNOSIS — I200 Unstable angina: Secondary | ICD-10-CM | POA: Insufficient documentation

## 2024-08-17 LAB — BASIC METABOLIC PANEL WITH GFR
Anion gap: 12 (ref 5–15)
BUN: 15 mg/dL (ref 6–20)
CO2: 22 mmol/L (ref 22–32)
Calcium: 9.1 mg/dL (ref 8.9–10.3)
Chloride: 103 mmol/L (ref 98–111)
Creatinine, Ser: 1.07 mg/dL (ref 0.61–1.24)
GFR, Estimated: >60 mL/min (ref >60)
Glucose, Bld: 95 mg/dL (ref 70–99)
Potassium: 4 mmol/L (ref 3.5–5.1)
Sodium: 137 mmol/L (ref 135–145)

## 2024-08-17 LAB — CBC
HCT: 38.1 % — ABNORMAL LOW (ref 39.0–52.0)
Hemoglobin: 12.5 g/dL — ABNORMAL LOW (ref 13.0–17.0)
MCH: 29.5 pg (ref 26.0–34.0)
MCHC: 32.8 g/dL (ref 30.0–36.0)
MCV: 89.9 fL (ref 80.0–100.0)
Platelets: 286 K/uL (ref 150–400)
RBC: 4.24 MIL/uL (ref 4.22–5.81)
RDW: 13.2 % (ref 11.5–15.5)
WBC: 5.4 K/uL (ref 4.0–10.5)
nRBC: 0 % (ref 0.0–0.2)

## 2024-08-17 NOTE — Patient Instructions (Signed)
  [facility] A DEPT OF [place] [address] [place] [address] Dept: [phone] Loc: [phone]  KOOPER GODSHALL  08/17/2024  You are scheduled for a Cardiac Catheterization on Tuesday, September 16 with Dr. Ozell Fell.  1. Please arrive at the [facility] (Main Entrance A) at [facility]: [address] [place], [place] [address] at 8:30 AM (This time is 2 hour(s) before your procedure to ensure your preparation).   Free valet parking service is available. You will check in at ADMITTING. The support person will be asked to wait in the waiting room.  It is OK to have someone drop you off and come back when you are ready to be discharged.    Special note: Every effort is made to have your procedure done on time. Please understand that emergencies sometimes delay scheduled procedures.  2. Diet: Nothing to eat after midnight.   3. Hydration: You need to be well hydrated before your procedure. On September 16, you may drink approved liquids (see below) until 2 hours before the procedure, with 16 oz of water  as your last intake.   List of approved liquids water , clear juice, clear tea, black coffee, fruit juices, non-citric and without pulp, carbonated beverages, Gatorade, Kool -Aid, plain Jello-O and plain ice popsicles.  4. Labs: You will need to have blood drawn on TODAY CBC,BMET  5. Medication instructions in preparation for your procedure:   Contrast Allergy: No  HOLD Lasix  the morning of cath  On the morning of your procedure, take your Aspirin  81 mg and any morning medicines NOT listed above.  You may use sips of water .  6. Plan to go home the same day, you will only stay overnight if medically necessary. 7. Bring a current list of your medications and current insurance cards. 8. You MUST have a responsible person to drive you home. 9. Someone MUST be with you the first 24 hours after you  arrive home or your discharge will be delayed. 10. Please wear clothes that are easy to get on and off and wear slip-on shoes.  Thank you for allowing us  to care for you!   --  Invasive Cardiovascular services

## 2024-08-17 NOTE — Progress Notes (Signed)
    Cardiology Office Note  Date: 08/17/2024   ID: Paul Arias, DOB 08-03-1966, MRN [id number]  History of Present Illness: Paul Arias is a 58 y.o. male last seen in April.  He is here for a follow-up visit.  He tells me that he has been getting more short of breath with typical activities over the last several months.  Describes NYHA class III dyspnea walking up a flight of steps.  No definite chest tightness or interval nitroglycerin  use, but stamina has decreased as well.  Medications reviewed.  He reports compliance with his cardiac regimen.  Blood pressure well-controlled today.  I did review his interval lab work which is noted below.  He underwent DES to the mid LAD in December 2024 with known CTO of the RCA and left-to-right collaterals.  Given his symptoms, we discussed the risks and benefits of a follow-up cardiac catheterization and he is in agreement to proceed.  Physical Exam: VS:  BP 102/62 (BP Location: Left Arm, Cuff Size: Normal)   Pulse 66   Ht 6' 3 (1.905 m)   Wt 209 lb (94.8 kg)   BMI 26.12 kg/m , BMI Body mass index is 26.12 kg/m.  Wt Readings from Last 3 Encounters:  08/17/24 209 lb (94.8 kg)  07/15/24 209 lb 7 oz (95 kg)  07/07/24 209 lb 7 oz (95 kg)    General: Patient appears comfortable at rest. HEENT: Conjunctiva and lids normal. Neck: Supple, no elevated JVP or carotid bruits. Lungs: Clear to auscultation, nonlabored breathing at rest. Cardiac: Regular rate and rhythm, no S3 or significant systolic murmur, no pericardial rub. Abdomen: Soft, nontender, bowel sounds present. Extremities: No pitting edema.  ECG:  An ECG dated 11/20/2023 was personally reviewed today and demonstrated:  Sinus rhythm.  Labwork: 11/16/2023: BUN 15; Creatinine, Ser 1.12; Potassium 4.2; Sodium 133 01/13/2024: TSH 0.314 07/07/2024: Hemoglobin 12.1; Platelets 248  August 2025: Hemoglobin 11.6, platelets 284, BUN 18, creatinine 1.27, GFR 65, potassium 4.2, AST 28,  ALT 28, cholesterol 163, triglycerides 274, HDL 40, LDL 78, hemoglobin A1c 5.5%  Other Studies Reviewed Today:  No interval cardiac testing for review today.  Assessment and Plan:  1.  CAD with chronically occluded RCA associated with left-to-right collaterals and moderate proximal to mid LAD stenosis with IFR ratio of 0.88 by follow-up cardiac catheterization in December 2024 status post DES to the LAD at that time.  LVEF 60 to 65% by echocardiogram in September 2024.  He reports progressive dyspnea on exertion concerning for accelerating angina as discussed above despite consistent medication use.  We discussed the risks and benefits of a follow-up diagnostic cardiac catheterization and he in agreement to proceed.  Continue aspirin  81 mg daily, Plavix  75 mg daily, Repatha  140 mg every 14 days, Imdur  30 mg daily, and Crestor  10 mg daily.   2.  Mixed hyperlipidemia.  LDL 78 in August.  He is on Crestor  10 mg daily and Repatha  140 mg every 14 days.  3.  Primary hypertension.  Blood pressure well-controlled today.  No changes made to current regimen which includes Toprol -XL 25 mg daily.  Disposition:  Follow up after procedure.  Signed, Jayson JUDITHANN Sierras, M.D., F.A.C.C. [facility]

## 2024-08-22 ENCOUNTER — Telehealth: Payer: Self-pay

## 2024-08-22 ENCOUNTER — Telehealth: Payer: Self-pay | Admitting: Cardiology

## 2024-08-22 DIAGNOSIS — I200 Unstable angina: Secondary | ICD-10-CM

## 2024-08-22 NOTE — Telephone Encounter (Signed)
 Checking percert on the following patient for testing scheduled at [facility].   exercise myoview . ---08/25/2024  Accelerating angina Dr. Debera

## 2024-08-22 NOTE — Telephone Encounter (Signed)
 Cardiac cath procedure denied by insurance. Per staff message from Dr.McDowell, schedule exercise myoview . Millicent in cath lab notified, patient made aware.  I discussed exercise myoview  instructions with patient and he is instructed to be NPO 6 hours before, Hold his lasix  and metoprolol  that morning.   I sent exercise myoview  instructions to patient via MyChart at his request.

## 2024-08-23 ENCOUNTER — Ambulatory Visit (HOSPITAL_COMMUNITY): Admission: RE | Admit: 2024-08-23 | Source: Home / Self Care | Admitting: Cardiovascular Disease

## 2024-08-23 ENCOUNTER — Encounter (HOSPITAL_COMMUNITY): Admission: RE | Payer: Self-pay | Source: Home / Self Care

## 2024-08-23 SURGERY — LEFT HEART CATH AND CORONARY ANGIOGRAPHY
Anesthesia: LOCAL

## 2024-08-25 ENCOUNTER — Encounter (HOSPITAL_BASED_OUTPATIENT_CLINIC_OR_DEPARTMENT_OTHER)
Admission: RE | Admit: 2024-08-25 | Discharge: 2024-08-25 | Disposition: A | Discharge status: Home / Self Care | Source: Ambulatory Visit | Attending: Cardiology | Admitting: Cardiology

## 2024-08-25 ENCOUNTER — Telehealth: Payer: Self-pay | Admitting: Student

## 2024-08-25 ENCOUNTER — Ambulatory Visit: Payer: Self-pay | Admitting: Cardiology

## 2024-08-25 ENCOUNTER — Ambulatory Visit (HOSPITAL_COMMUNITY)
Admission: RE | Admit: 2024-08-25 | Discharge: 2024-08-25 | Disposition: A | Discharge status: Home / Self Care | Source: Ambulatory Visit | Attending: Cardiology | Admitting: Cardiology

## 2024-08-25 DIAGNOSIS — I200 Unstable angina: Secondary | ICD-10-CM

## 2024-08-25 LAB — NM MYOCAR MULTI W/SPECT W/WALL MOTION / EF
Estimated workload: 7
Exercise duration (min): 4 min
Exercise duration (sec): 36 s
LV dias vol: 120 mL (ref 62–150)
LV sys vol: 54 mL (ref 4.2–5.8)
MPHR: 160 {beats}/min
Nuc Stress EF: 55 %
Peak HR: 126 {beats}/min
Percent HR: 78 %
RATE: 0.4
RPE: 15
Rest HR: 53 {beats}/min
Rest Nuclear Isotope Dose: 10.7 mCi
SDS: 1
SRS: 1
SSS: 2
ST Depression (mm): 0 mm
Stress Nuclear Isotope Dose: 29.1 mCi
TID: 1.05

## 2024-08-25 MED ORDER — TECHNETIUM TC 99M TETROFOSMIN IV KIT
29.1000 | PACK | Freq: Once | INTRAVENOUS | Status: AC | PRN
  Administered 2024-08-25: 29.1 via INTRAVENOUS

## 2024-08-25 MED ORDER — TECHNETIUM TC 99M TETROFOSMIN IV KIT
10.7000 | PACK | Freq: Once | INTRAVENOUS | Status: AC | PRN
  Administered 2024-08-25: 10.7 via INTRAVENOUS

## 2024-08-25 MED ORDER — REGADENOSON 0.4 MG/5ML IV SOLN
INTRAVENOUS | Status: AC
  Administered 2024-08-25: 0.4 mg via INTRAVENOUS
  Filled 2024-08-25: qty 5

## 2024-08-25 MED ORDER — SODIUM CHLORIDE FLUSH 0.9 % IV SOLN
INTRAVENOUS | Status: AC
  Administered 2024-08-25: 10 mL via INTRAVENOUS
  Filled 2024-08-25: qty 10

## 2024-08-25 NOTE — Telephone Encounter (Signed)
     Brodyn M Termine presented for a Treadmill/Lexiscan  nuclear stress test today.  I Laymon CHRISTELLA Qua, PA-C, provided direct supervision and was present during the stress portion of the study today, which was completed without significant symptoms, immediate complications, or acute ST/T changes on ECG.  Stress imaging is pending at this time.  Preliminary ECG findings may be listed in the chart, but the stress test result will not be finalized until perfusion imaging is complete.  Laymon CHRISTELLA Qua, PA-C  08/25/2024, 11:07 AM

## 2024-08-26 MED ORDER — ISOSORBIDE MONONITRATE ER 30 MG PO TB24: 30.0000 mg | ORAL_TABLET | Freq: Two times a day (BID) | ORAL | 3 refills | Status: AC

## 2024-08-26 NOTE — Telephone Encounter (Signed)
 Patient is requesting call back to review results.

## 2024-08-26 NOTE — Telephone Encounter (Signed)
 The patient has been notified of the result and verbalized understanding.  All questions (if any) were answered. DOMINIC COLUMBUS, LPN [id number]/7974 1:62 AM

## 2024-09-12 ENCOUNTER — Other Ambulatory Visit: Payer: Self-pay

## 2024-09-12 MED ORDER — ROSUVASTATIN CALCIUM 10 MG PO TABS: 10.0000 mg | ORAL_TABLET | Freq: Every day | ORAL | 3 refills | Status: AC

## 2024-09-20 ENCOUNTER — Ambulatory Visit: Admitting: Student

## 2024-09-28 ENCOUNTER — Telehealth: Payer: Self-pay

## 2024-09-28 ENCOUNTER — Encounter: Payer: Self-pay | Admitting: Cardiology

## 2024-09-28 ENCOUNTER — Other Ambulatory Visit: Payer: Self-pay | Admitting: Cardiology

## 2024-09-28 ENCOUNTER — Other Ambulatory Visit (HOSPITAL_COMMUNITY)
Admission: RE | Admit: 2024-09-28 | Discharge: 2024-09-28 | Disposition: A | Discharge status: Home / Self Care | Source: Ambulatory Visit | Attending: Cardiology | Admitting: Cardiology

## 2024-09-28 ENCOUNTER — Ambulatory Visit: Attending: Cardiology | Admitting: Cardiology

## 2024-09-28 VITALS — BP 130/88 | HR 62 | Ht 75.0 in | Wt 216.6 lb

## 2024-09-28 DIAGNOSIS — I200 Unstable angina: Secondary | ICD-10-CM | POA: Insufficient documentation

## 2024-09-28 DIAGNOSIS — I25119 Atherosclerotic heart disease of native coronary artery with unspecified angina pectoris: Secondary | ICD-10-CM | POA: Diagnosis not present

## 2024-09-28 DIAGNOSIS — I10 Essential (primary) hypertension: Secondary | ICD-10-CM | POA: Diagnosis not present

## 2024-09-28 DIAGNOSIS — R0609 Other forms of dyspnea: Secondary | ICD-10-CM

## 2024-09-28 DIAGNOSIS — E782 Mixed hyperlipidemia: Secondary | ICD-10-CM

## 2024-09-28 LAB — CBC
HCT: 35.9 % — ABNORMAL LOW (ref 39.0–52.0)
Hemoglobin: 11.7 g/dL — ABNORMAL LOW (ref 13.0–17.0)
MCH: 29.2 pg (ref 26.0–34.0)
MCHC: 32.6 g/dL (ref 30.0–36.0)
MCV: 89.5 fL (ref 80.0–100.0)
Platelets: 242 K/uL (ref 150–400)
RBC: 4.01 MIL/uL — ABNORMAL LOW (ref 4.22–5.81)
RDW: 14.4 % (ref 11.5–15.5)
WBC: 4.3 K/uL (ref 4.0–10.5)
nRBC: 0 % (ref 0.0–0.2)

## 2024-09-28 LAB — BASIC METABOLIC PANEL WITH GFR
Anion gap: 9 (ref 5–15)
BUN: 18 mg/dL (ref 6–20)
CO2: 27 mmol/L (ref 22–32)
Calcium: 9.3 mg/dL (ref 8.9–10.3)
Chloride: 99 mmol/L (ref 98–111)
Creatinine, Ser: 1.04 mg/dL (ref 0.61–1.24)
GFR, Estimated: >60 mL/min (ref >60)
Glucose, Bld: 97 mg/dL (ref 70–99)
Potassium: 4.1 mmol/L (ref 3.5–5.1)
Sodium: 135 mmol/L (ref 135–145)

## 2024-09-28 NOTE — Telephone Encounter (Signed)
 Pt was made aware and verbalized understanding. Will wait to hear back from us .

## 2024-09-28 NOTE — H&P (View-Only) (Signed)
 Cardiology Office Note  Date: 09/28/2024   ID: Rochell, Mabie 08/04/1966, MRN [id number]  History of Present Illness: Paul Arias is a 58 y.o. male last seen in September.  We had discussed proceeding to a diagnostic cardiac catheterization at that time given increasing dyspnea on exertion and worsening stamina concerning for progressive angina.  Procedure was declined by his insurance provider.  We did pursue an exercise/Lexiscan  Myoview  which demonstrated inferior/inferoseptal infarct scar but no active ischemia and LVEF 55%.  We attempted an increase in his Imdur  to 30 mg twice daily and he presents today reporting no improvement in symptoms, in fact worsening stamina.  We went over his medications, he reports compliance with cardiovascular regimen.  I reviewed his ECG today which shows normal sinus rhythm.  Lab work from August is noted below.  Physical Exam: VS:  BP 130/88   Pulse 62   Ht 6' 3 (1.905 m)   Wt 216 lb 9.6 oz (98.2 kg)   SpO2 95%   BMI 27.07 kg/m , BMI Body mass index is 27.07 kg/m.  Wt Readings from Last 3 Encounters:  09/28/24 216 lb 9.6 oz (98.2 kg)  08/17/24 209 lb (94.8 kg)  07/15/24 209 lb 7 oz (95 kg)    General: Patient appears comfortable at rest. HEENT: Conjunctiva and lids normal. Neck: Supple, no elevated JVP or carotid bruits. Lungs: Clear to auscultation, nonlabored breathing at rest. Cardiac: Regular rate and rhythm, no S3 or significant systolic murmur. Extremities: No pitting edema.  ECG:  An ECG dated 11/20/2023 was personally reviewed today and demonstrated:  Sinus rhythm.  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 There is no current ischemia. The study is low risk.   No ST deviation was noted.   LV perfusion is abnormal. Large moderate intensity fixed inferior/inferoseptal defect.   Left ventricular function is normal. Nuclear stress EF: 55%. The left ventricular ejection fraction is normal (55-65%). End diastolic cavity size is normal.  Assessment and Plan:  1.  CAD with chronically occluded RCA associated with left-to-right collaterals and moderate proximal to mid LAD stenosis with IFR ratio of 0.88 by follow-up cardiac catheterization in December 2024 status post DES to the LAD at that time.  Follow-up Lexiscan  Myoview  in September showed inferior/inferoseptal scar with no active ischemia and LVEF 55%.  Despite his noninvasive cardiac testing, he continues to report worsening dyspnea on exertion and lack of stamina concerning for accelerating angina, symptoms consistent with coronary insufficiency by his recollection as well.  Increasing Imdur  to 30 mg twice daily has not been beneficial.  He otherwise continues on aspirin  81 mg daily, Plavix  75 mg daily, Crestor  10 mg daily, Repatha  140 mg every 14 days, Toprol -XL 25 mg daily, and as needed nitroglycerin .  Would recommend a diagnostic cardiac catheterization to mainly ensure LAD stent patency and also no new compromise of his left to right collaterals as a contributor to his exertional symptoms.  He is in agreement to proceed.   2.  Mixed hyperlipidemia.  LDL 78 in August.  He is on Crestor  10 mg daily and Repatha  140 mg every 14 days.   3.  Primary hypertension.  No additional changes to present cardiac regimen.  Disposition:  Follow up after procedure.  Signed, Jayson JUDITHANN Sierras, M.D., F.A.C.C. [facility]

## 2024-09-28 NOTE — Telephone Encounter (Signed)
 Let's wait until we see what his heart cath shows today because if he is placed on Plavix  it will make a difference what PPI we would/could use.

## 2024-09-28 NOTE — Telephone Encounter (Signed)
 Pt called requesting refills on omeprazole  40 mg bid to be sent to [facility]. Pt states that Dr. Shona changed it to pantoprazole  but that it is not working and wants to be put back on the omeprazole  twice daily that worked.

## 2024-09-28 NOTE — Progress Notes (Signed)
 Cardiology Office Note  Date: 09/28/2024   ID: Rochell, Mabie 08/04/1966, MRN [id number]  History of Present Illness: Paul Arias is a 58 y.o. male last seen in September.  We had discussed proceeding to a diagnostic cardiac catheterization at that time given increasing dyspnea on exertion and worsening stamina concerning for progressive angina.  Procedure was declined by his insurance provider.  We did pursue an exercise/Lexiscan  Myoview  which demonstrated inferior/inferoseptal infarct scar but no active ischemia and LVEF 55%.  We attempted an increase in his Imdur  to 30 mg twice daily and he presents today reporting no improvement in symptoms, in fact worsening stamina.  We went over his medications, he reports compliance with cardiovascular regimen.  I reviewed his ECG today which shows normal sinus rhythm.  Lab work from August is noted below.  Physical Exam: VS:  BP 130/88   Pulse 62   Ht 6' 3 (1.905 m)   Wt 216 lb 9.6 oz (98.2 kg)   SpO2 95%   BMI 27.07 kg/m , BMI Body mass index is 27.07 kg/m.  Wt Readings from Last 3 Encounters:  09/28/24 216 lb 9.6 oz (98.2 kg)  08/17/24 209 lb (94.8 kg)  07/15/24 209 lb 7 oz (95 kg)    General: Patient appears comfortable at rest. HEENT: Conjunctiva and lids normal. Neck: Supple, no elevated JVP or carotid bruits. Lungs: Clear to auscultation, nonlabored breathing at rest. Cardiac: Regular rate and rhythm, no S3 or significant systolic murmur. Extremities: No pitting edema.  ECG:  An ECG dated 11/20/2023 was personally reviewed today and demonstrated:  Sinus rhythm.  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 There is no current ischemia. The study is low risk.   No ST deviation was noted.   LV perfusion is abnormal. Large moderate intensity fixed inferior/inferoseptal defect.   Left ventricular function is normal. Nuclear stress EF: 55%. The left ventricular ejection fraction is normal (55-65%). End diastolic cavity size is normal.  Assessment and Plan:  1.  CAD with chronically occluded RCA associated with left-to-right collaterals and moderate proximal to mid LAD stenosis with IFR ratio of 0.88 by follow-up cardiac catheterization in December 2024 status post DES to the LAD at that time.  Follow-up Lexiscan  Myoview  in September showed inferior/inferoseptal scar with no active ischemia and LVEF 55%.  Despite his noninvasive cardiac testing, he continues to report worsening dyspnea on exertion and lack of stamina concerning for accelerating angina, symptoms consistent with coronary insufficiency by his recollection as well.  Increasing Imdur  to 30 mg twice daily has not been beneficial.  He otherwise continues on aspirin  81 mg daily, Plavix  75 mg daily, Crestor  10 mg daily, Repatha  140 mg every 14 days, Toprol -XL 25 mg daily, and as needed nitroglycerin .  Would recommend a diagnostic cardiac catheterization to mainly ensure LAD stent patency and also no new compromise of his left to right collaterals as a contributor to his exertional symptoms.  He is in agreement to proceed.   2.  Mixed hyperlipidemia.  LDL 78 in August.  He is on Crestor  10 mg daily and Repatha  140 mg every 14 days.   3.  Primary hypertension.  No additional changes to present cardiac regimen.  Disposition:  Follow up after procedure.  Signed, Jayson JUDITHANN Sierras, M.D., F.A.C.C. [facility]

## 2024-09-28 NOTE — Patient Instructions (Addendum)
  [facility] [place] [facility] OF [facility] [address]  [place] [address] Dept: [phone] Loc: [phone]  Paul Arias  09/28/2024  You are scheduled for a Cardiac Catheterization on wednesday, October 29 with Dr Alm Clay  1. Please arrive at the [facility] (Main Entrance A) at [facility]: [address] [place], [place] [address] at 8 am (This time is 2 hour(s) before your procedure to ensure your preparation).   Free valet parking service is available. You will check in at ADMITTING. The support person will be asked to wait in the waiting room.  It is OK to have someone drop you off and come back when you are ready to be discharged.    Special note: Every effort is made to have your procedure done on time. Please understand that emergencies sometimes delay scheduled procedures.  2. Diet: NPO: Nothing to eat OR drink after midnight. (For TEE and Cath the same day)   3. Hydration: You need to be well hydrated before your procedure. On October 29, you may drink approved liquids (see below) until 2 hours before the procedure, with 16 oz of water  as your last intake.   List of approved liquids water , clear juice, clear tea, black coffee, fruit juices, non-citric and without pulp, carbonated beverages, Gatorade, Kool -Aid, plain Jello-O and plain ice popsicles.  4. Labs: You will need to have blood drawn on TODAY 5. Medication instructions in preparation for your procedure:   Contrast Allergy: No   HOLD Lasix  the morning of cath   On the morning of your procedure, take your Aspirin  81 mg and any morning medicines NOT listed above.  You may use sips of water .  6. Plan to go home the same day, you will only stay overnight if medically necessary. 7. Bring a current list of your medications and current insurance cards. 8. You MUST have a responsible person to drive you home. 9. Someone MUST be with  you the first 24 hours after you arrive home or your discharge will be delayed. 10. Please wear clothes that are easy to get on and off and wear slip-on shoes.  Thank you for allowing us  to care for you!   -- [facility]

## 2024-10-04 ENCOUNTER — Telehealth: Payer: Self-pay | Admitting: *Deleted

## 2024-10-04 ENCOUNTER — Ambulatory Visit: Admitting: Gastroenterology

## 2024-10-04 ENCOUNTER — Encounter: Payer: Self-pay | Admitting: Gastroenterology

## 2024-10-04 VITALS — BP 134/80 | HR 65 | Temp 98.6°F | Ht 75.0 in | Wt 215.4 lb

## 2024-10-04 DIAGNOSIS — D649 Anemia, unspecified: Secondary | ICD-10-CM | POA: Diagnosis not present

## 2024-10-04 DIAGNOSIS — K625 Hemorrhage of anus and rectum: Secondary | ICD-10-CM | POA: Diagnosis not present

## 2024-10-04 DIAGNOSIS — D509 Iron deficiency anemia, unspecified: Secondary | ICD-10-CM | POA: Insufficient documentation

## 2024-10-04 DIAGNOSIS — R21 Rash and other nonspecific skin eruption: Secondary | ICD-10-CM | POA: Diagnosis not present

## 2024-10-04 DIAGNOSIS — D500 Iron deficiency anemia secondary to blood loss (chronic): Secondary | ICD-10-CM

## 2024-10-04 MED ORDER — KETOCONAZOLE 2 % EX CREA: 1.0000 | TOPICAL_CREAM | Freq: Two times a day (BID) | CUTANEOUS | 0 refills | Status: AC

## 2024-10-04 NOTE — Telephone Encounter (Signed)
 Cardiac Catheterization scheduled at [facility] for: Wednesday October 05, 2024 10 AM Arrival time [facility] Main Entrance A at: 8 AM  Diet: -Nothing to eat after midnight.  Hydration: -May drink clear liquids until 2 hours before the procedure.  Approved liquids: Water , clear tea, black coffee, fruit juices-non-citric and without pulp,Gatorade, plain Jello/popsicles.   -Please drink 16 oz of water  2 hours before procedure.  Medication instructions: -Hold:  Lasix /KCl-AM of procedure -Other usual morning medications can be taken including aspirin  81 mg and Plavix  75 mg.  Plan to go home the same day, you will only stay overnight if medically necessary.  You must have responsible adult to drive you home.  Someone must be with you the first 24 hours after you arrive home.  Call placed to patient to review instructions, no answer.

## 2024-10-04 NOTE — Progress Notes (Signed)
 GI Office Note    Referring Provider: Shona Norleen PEDLAR, MD Primary Care Physician:  Shona Norleen PEDLAR, MD  Primary Gastroenterologist: Ozell Hollingshead, MD   Chief Complaint   Chief Complaint  Patient presents with   Follow-up    Follow up for rectal bleeding. Pt has had a little bleeding off and on for weeks but last Sunday pt stated he had a lot of blood in toilet after BM    History of Present Illness   Paul Arias is a 58 y.o. male presenting today for next day appointment for rectal bleeding.  Patient was last seen in April. 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   He is scheduled for cardiac catheterization tomorrow for increasing dyspnea on exertion and worsening stamina concerning for progressive angina.  History of chronically occluded RCA associated with left-to-right collaterals and moderate proximal to mid LAD stenosis with cardiac catheterization December 2024 status post DES to LAD at that time.  Cardiac catheterization recommended at this time to ensure LAD stent patency no new compromise to his left-to-right collaterals.  Discussed the use of AI scribe software for clinical note transcription with the patient, who gave verbal consent to proceed.   He has been experiencing gastrointestinal bleeding, characterized by blood in the toilet and on tissue after bowel movements. The stool is described as 'kind of mushy' and not hard. He has observed black, tarry stools intermittently over the past couple of months, which he associates with starting an over-the-counter iron supplement. Occasional red blood in the stool occurs approximately once every two weeks, with a significant episode last Sunday. He recalls a similar episode of bleeding following a colonoscopy last EGD in 2017, which required intervention.  At that time he had an EGD with polypectomy of an adenomatous polyp in the duodenum  and polyp removed from the sigmoid colon.  He required repeat colonoscopy with clipping of the polypectomy site as well as an EGD with clipping of the duodenal polypectomy site.  He is currently taking pantoprazole  twice daily, which manages his acid reflux symptoms adequately.  He has a history of anemia, with a recent blood count of 11.4, down from 12.5 in September. He takes an over-the-counter iron supplement. He also experiences epistaxis approximately once a month, with a recent episode lasting about 30 minutes. States he has both B12 and IDA. Takes oral supplements for both.  He consumes alcohol regularly, approximately four beers per night, and occasionally liquor. He has not been taking NSAIDs such as ibuprofen or Advil since his first colonoscopy. He receives Repatha  injections every two weeks for cholesterol management.  He mentions a rash on his buttocks, attributed to sweat, for which he uses a cream prescribed by PCP. Rash is not improving after several weeks of use. No nausea, vomiting, or constipation. He reports a tingling sensation on his right upper side that lasted three to four days but has since resolved. He denies taking any other blood thinners or medications by injection, except for Repatha .         Prior Data   Labs from October 2025: Sodium 136, potassium 3.6, BUN 11, creatinine 1.1, hemoglobin 12.1, hematocrit 36.8, MCV 87.4, platelets 246, TIBC 477, iron 50, iron sats 10, B12 1014  EGD 04/2022: -minimal adenomatous changes in duodenal bulb, benign -abnormal esophagus c/w short segment Barrett's esophagus s/p bx c/w reflux no Barrett's on bx but confirmed in past, repeat  EGD in five years   Colonoscopy 04/2022: -nonbleedng internal hemorrhoids -one 8mm polyp splenic flexure, removed with cold snare, tubular adenoma -next colonoscopy five years  Medications   Current Outpatient Medications  Medication Sig Dispense Refill   ALPRAZolam  (XANAX ) 1 MG tablet Take 2 mg  by mouth at bedtime.      aspirin  EC 81 MG tablet Take 1 tablet (81 mg total) by mouth daily. Swallow whole. 90 tablet 2   Biotin  800 MCG TABS Take 800 mcg by mouth daily.     clopidogrel  (PLAVIX ) 75 MG tablet Take 1 tablet (75 mg total) by mouth daily. 90 tablet 3   Evolocumab  (REPATHA  SURECLICK) 140 MG/ML SOAJ Inject 140 mg into the skin every 14 (fourteen) days. 6 mL 3   fenofibrate  160 MG tablet Take 160 mg by mouth at bedtime.      ferrous sulfate 325 (65 FE) MG tablet Take 325 mg by mouth daily with breakfast.     finasteride (PROSCAR) 5 MG tablet Take 5 mg by mouth daily.     furosemide  (LASIX ) 40 MG tablet Take 1 tablet (40 mg total) by mouth daily for 3 days, THEN 1 tablet (40 mg total) daily as needed. 90 tablet 1   HYDROcodone -acetaminophen  (NORCO) 10-325 MG tablet Take 1 tablet by mouth in the morning, at noon, in the evening, and at bedtime.     isosorbide  mononitrate (IMDUR ) 30 MG 24 hr tablet Take 1 tablet (30 mg total) by mouth 2 (two) times daily. 180 tablet 3   ketoconazole (NIZORAL) 2 % cream Apply 1 Application topically 2 (two) times daily. For 2 weeks 30 g 0   magnesium  oxide (MAG-OX) 400 MG tablet Take 400 mg by mouth daily.     metoprolol  succinate (TOPROL -XL) 25 MG 24 hr tablet TAKE 1 TABLET BY MOUTH ONCE DAILY. 30 tablet 8   nitroGLYCERIN  (NITROSTAT ) 0.4 MG SL tablet Place 1 tablet (0.4 mg total) under the tongue every 5 (five) minutes as needed for chest pain. 25 tablet 3   nystatin cream (MYCOSTATIN) Apply 1 Application topically 2 (two) times daily.     pantoprazole  (PROTONIX ) 40 MG tablet Take 40 mg by mouth 2 (two) times daily.     potassium chloride  (KLOR-CON ) 10 MEQ tablet Take 10 mEq by mouth daily.     rosuvastatin  (CRESTOR ) 10 MG tablet Take 1 tablet (10 mg total) by mouth daily. 90 tablet 3   tetrahydrozoline 0.05 % ophthalmic solution Place 1 drop into both eyes as needed (dry eyes).     traZODone  (DESYREL ) 50 MG tablet Take 50-100 mg by mouth at bedtime.      vitamin B-12 (CYANOCOBALAMIN) 500 MCG tablet Take 500 mcg by mouth daily.     Vitamin D-Vitamin K (VITAMIN K2-VITAMIN D3 PO) Take 1 capsule by mouth daily.     zinc  gluconate 50 MG tablet Take 50 mg by mouth daily.     No current facility-administered medications for this visit.    Allergies   Allergies as of 10/04/2024   (No Known Allergies)     Past Medical History   Past Medical History:  Diagnosis Date   Alcohol dependence in remission [facility])    Allergic rhinitis    Arthritis    Bipolar disorder (HCC)    CAD (coronary artery disease)    Cardiac catheterization May 2021 - medical therapy recommended   Chronic pain    Colon polyp    Tubular adenoma   COPD (chronic obstructive pulmonary disease) (  HCC)    Gastric polyp    Tubular adenoma   GERD (gastroesophageal reflux disease)    Hypertension    Mixed hyperlipidemia     Past Surgical History   Past Surgical History:  Procedure Laterality Date   BIOPSY  02/09/2020   Procedure: BIOPSY;  Surgeon: Shaaron Lamar HERO, MD;  [facility]: [facility];  Service: Endoscopy;;   BIOPSY  12/17/2020   Procedure: BIOPSY;  Surgeon: Wilhelmenia Aloha Raddle., MD;  Location: [facility] ENDOSCOPY;  Service: Gastroenterology;;   BIOPSY  04/10/2022   Procedure: BIOPSY;  Surgeon: Shaaron Lamar HERO, MD;  Location: [facility];  Service: Endoscopy;;   CATARACT EXTRACTION W/PHACO Bilateral    COLONOSCOPY N/A 08/19/2016   Dr. Oneil Budge: 2 semi-pedunculated polyps in the distal sigmoid colon removed. Measure 2-5 mm. Tubular adenomas.   COLONOSCOPY WITH PROPOFOL  N/A 02/09/2020   7 polyps (tubular adenomas and hyperplastic)with recommended 5-year repeat (2026).   COLONOSCOPY WITH PROPOFOL  N/A 04/10/2022   Procedure: COLONOSCOPY WITH PROPOFOL ;  Surgeon: Shaaron Lamar HERO, MD;  [facility]: [facility];  Service: Endoscopy;  Laterality: N/A;  2:00pm   CORONARY BALLOON ANGIOPLASTY N/A 10/05/2024   Procedure: CORONARY BALLOON ANGIOPLASTY;  Surgeon: Anner Alm ORN, MD;  Location: [facility] INVASIVE CV LAB;  Service: Cardiovascular;  Laterality: N/A;   CORONARY PRESSURE/FFR STUDY N/A 11/20/2023   Procedure: CORONARY PRESSURE/FFR STUDY;  Surgeon: Darron Deatrice LABOR, MD;  Location: [facility] INVASIVE CV LAB;  Service: Cardiovascular;  Laterality: N/A;   CORONARY STENT INTERVENTION N/A 11/20/2023   Procedure: CORONARY STENT INTERVENTION;  Surgeon: Darron Deatrice LABOR, MD;  Location: [facility] INVASIVE CV LAB;  Service: Cardiovascular;  Laterality: N/A;   ENDOSCOPIC MUCOSAL RESECTION N/A 05/02/2020   Procedure: ENDOSCOPIC MUCOSAL RESECTION;  Surgeon: Wilhelmenia Aloha Raddle., MD;  Location: [facility];  Service: Gastroenterology;  Laterality: N/A;   ESOPHAGEAL DILATION N/A 05/13/2024   Procedure: DILATION, ESOPHAGUS;  Surgeon: Shaaron Lamar HERO, MD;  Location: [facility];  Service: Endoscopy;  Laterality: N/A;   ESOPHAGOGASTRODUODENOSCOPY N/A 08/19/2016   Dr. Budge: Villous appearing mucosa in the first portion of the duodenum, polyp removed with hot snare, pathology tubular adenoma. Medium sized hiatal hernia. Biopsy for CLOtest which was negative.   ESOPHAGOGASTRODUODENOSCOPY N/A 05/13/2024   Procedure: EGD (ESOPHAGOGASTRODUODENOSCOPY);  Surgeon: Shaaron Lamar HERO, MD;  [facility];  Service: Endoscopy;  Laterality: N/A;  2:30 pm, ok rm 1/2   ESOPHAGOGASTRODUODENOSCOPY (EGD) WITH PROPOFOL   08/20/2016   Dr. Shaaron: assisted Dr. Budge emergently. Esophagus normal. Medium sized hiatal hernia. One nonbleeding cratered stellate-shaped gastric ulcer measuring 8 mm. One cratered ulcerated area with adherent clot in the second portion of the duodenum. 25 mm in diameter. Visible vessel seen. Status post bleeding control therapy. Heaped up margins consistent with incomplete removal.    ESOPHAGOGASTRODUODENOSCOPY (EGD) WITH PROPOFOL  N/A 10/27/2016   PROPOFOL ;  Surgeon: Lamar HERO Shaaron, MD; Barrett's esophagus without dysplasia or malignancy.  Normal stomach with previous gastric  ulcer healed.  2 duodenal polyps revealing fragments of small bowel adenoma, otherwise normal.  Recommendations to repeat endoscopy in 1 year.   ESOPHAGOGASTRODUODENOSCOPY (EGD) WITH PROPOFOL  N/A 02/09/2020   probable short segment Barrett's s/p biopsy, multiple adenomatous appearing duodenal lesions s/p biopsy.   ESOPHAGOGASTRODUODENOSCOPY (EGD) WITH PROPOFOL  N/A 05/02/2020   small hiatal hernia, mucosal nodule in duodenum with complete removal s/p piecemeal mucosal resection and avulsion s/p clips.   ESOPHAGOGASTRODUODENOSCOPY (EGD) WITH PROPOFOL  N/A 12/17/2020   duodenal scar from prior EMR, small are of possible  recurrence s/p removal. One fragment of duodenal adenoma. Needs surveillance EGD Jan 2023.   ESOPHAGOGASTRODUODENOSCOPY (EGD) WITH PROPOFOL  N/A 04/10/2022   Procedure: ESOPHAGOGASTRODUODENOSCOPY (EGD) WITH PROPOFOL ;  Surgeon: Shaaron Lamar HERO, MD;  [facility];  Service: Endoscopy;  Laterality: N/A;   FLEXIBLE SIGMOIDOSCOPY N/A 08/20/2016   Dr. Mavis: Hematin found in mid sigmoid colon. One hemostatic clip placed.   HEMOSTASIS CLIP PLACEMENT  05/02/2020   Procedure: HEMOSTASIS CLIP PLACEMENT;  Surgeon: Wilhelmenia Aloha Raddle., MD;  Location: [facility];  Service: Gastroenterology;;   LEFT HEART CATH AND CORONARY ANGIOGRAPHY N/A 04/11/2020   Procedure: LEFT HEART CATH AND CORONARY ANGIOGRAPHY;  Surgeon: Wonda Sharper, MD;  Location: [facility] INVASIVE CV LAB;  Service: Cardiovascular;  Laterality: N/A;   LEFT HEART CATH AND CORONARY ANGIOGRAPHY N/A 11/20/2023   Procedure: LEFT HEART CATH AND CORONARY ANGIOGRAPHY;  Surgeon: Darron Deatrice LABOR, MD;  Location: [facility] INVASIVE CV LAB;  Service: Cardiovascular;  Laterality: N/A;   LEFT HEART CATH AND CORONARY ANGIOGRAPHY N/A 10/05/2024   Procedure: LEFT HEART CATH AND CORONARY ANGIOGRAPHY;  Surgeon: Anner Alm ORN, MD;  Location: [facility] INVASIVE CV LAB;  Service: Cardiovascular;  Laterality: N/A;   POLYPECTOMY  02/09/2020   Procedure:  POLYPECTOMY;  Surgeon: Shaaron Lamar HERO, MD;  [facility];  Service: Endoscopy;;   POLYPECTOMY  12/17/2020   Procedure: POLYPECTOMY;  Surgeon: Wilhelmenia Aloha Raddle., MD;  Location: [facility] ENDOSCOPY;  Service: Gastroenterology;;   POLYPECTOMY  04/10/2022   Procedure: POLYPECTOMY;  Surgeon: Shaaron Lamar HERO, MD;  [facility]: [facility];  Service: Endoscopy;;   SUBMUCOSAL LIFTING INJECTION  05/02/2020   Procedure: SUBMUCOSAL LIFTING INJECTION;  Surgeon: Wilhelmenia Aloha Raddle., MD;  Location: [facility];  Service: Gastroenterology;;    Past Family History   Family History  Problem Relation Age of Onset   Cirrhosis Mother    CAD Mother 6       Premature CAD   Heart disease Father 6       Premature CAD   Esophageal cancer Brother    Unexplained death Brother 52       died in his sleep   Colon cancer Neg Hx    Inflammatory bowel disease Neg Hx    Liver disease Neg Hx    Pancreatic cancer Neg Hx    Stomach cancer Neg Hx     Past Social History   Social History   Socioeconomic History   Marital status: Single    Spouse name: Not on file   Number of children: 2   Years of education: Not on file   Highest education level: Not on file  Occupational History   Not on file  Tobacco Use   Smoking status: Former    Current packs/day: 0.00    Average packs/day: 0.3 packs/day for 40.0 years (10.0 ttl pk-yrs)    Types: Cigarettes    Start date: 09/08/1979    Quit date: 09/08/2019    Years since quitting: 5.0    Passive exposure: Never   Smokeless tobacco: Never  Vaping Use   Vaping status: Never Used  Substance and Sexual Activity   Alcohol use: Yes    Alcohol/week: 3.0 - 4.0 standard drinks of alcohol    Types: 3 - 4 Cans of beer per week    Comment: daily   Drug use: No   Sexual activity: Not Currently    Birth control/protection: None  Other Topics Concern   Not on file  Social History Narrative  Not on file   Social Drivers of Health   Financial Resource  Strain: Not on file  Food Insecurity: No Food Insecurity (10/05/2024)   Hunger Vital Sign    Worried About Running Out of Food in the Last Year: Never true    Ran Out of Food in the Last Year: Never true  Transportation Needs: No Transportation Needs (10/05/2024)   PRAPARE - Administrator, Civil Service (Medical): No    Lack of Transportation (Non-Medical): No  Physical Activity: Not on file  Stress: Not on file  Social Connections: Not on file  Intimate Partner Violence: Not At Risk (10/05/2024)   Humiliation, Afraid, Rape, and Kick questionnaire    Fear of Current or Ex-Partner: No    Emotionally Abused: No    Physically Abused: No    Sexually Abused: No    Review of Systems   General: Negative for anorexia, weight loss, fever, chills, fatigue, weakness. ENT: Negative for hoarseness, difficulty swallowing , nasal congestion. CV: Negative for chest pain, angina, palpitations, +dyspnea on exertion, peripheral edema.  Respiratory: Negative for dyspnea at rest, +dyspnea on exertion, cough, sputum, wheezing.  GI: See history of present illness. GU:  Negative for dysuria, hematuria, urinary incontinence, urinary frequency, nocturnal urination.  Endo: Negative for unusual weight change.     Physical Exam   BP 134/80   Pulse 65   Temp 98.6 F (37 C)   Ht 6' 3 (1.905 m)   Wt 215 lb 6.4 oz (97.7 kg)   BMI 26.92 kg/m    General: Well-nourished, well-developed in no acute distress.  Eyes: No icterus. Mouth: Oropharyngeal mucosa moist and pink   Lungs: Clear to auscultation bilaterally.  Heart: Regular rate and rhythm, no murmurs rubs or gallops.  Abdomen: Bowel sounds are normal, nontender, nondistended, no hepatosplenomegaly or masses,  no abdominal bruits or hernia , no rebound or guarding.  Rectal: well demarcated erythematous rash noted throughout the perianal area extending into groin/upper inner thighs. No external hemorrhoids. No masses in rectal vault. No  gross blood per rectum Extremities: No lower extremity edema. No clubbing or deformities. Neuro: Alert and oriented x 4   Skin: Warm and dry, no jaundice.   Psych: Alert and cooperative, normal mood and affect.  Labs   Lab Results  Component Value Date   WBC 4.0 10/06/2024   HGB 12.1 (L) 10/06/2024   HCT 36.8 (L) 10/06/2024   MCV 87.4 10/06/2024   PLT 246 10/06/2024   Lab Results  Component Value Date   NA 136 10/06/2024   CL 101 10/06/2024   K 3.6 10/06/2024   CO2 24 10/06/2024   BUN 11 10/06/2024   CREATININE 1.10 10/06/2024   GFRNONAA >60 10/06/2024   CALCIUM  9.1 10/06/2024   ALBUMIN 3.4 (L) 08/19/2016   GLUCOSE 103 (H) 10/06/2024   Lab Results  Component Value Date   ALT 23 08/19/2016   AST 33 08/19/2016   ALKPHOS 43 08/19/2016   BILITOT 0.7 08/19/2016    Imaging Studies   CARDIAC CATHETERIZATION Result Date: 10/05/2024 Images from the original result were not included.   Previously placed DES in the prox LAD to Mid LAD is widely patent   1st Diag lesion is 40% stenosed.   Mid Cx to Dist Cx lesion is 60% stenosed.  Stable from last cath   Ost RCA to Prox RCA lesion is 99% stenosed.   Drug-coated balloon angioplasty was performed using a BALL DC AGENT 2.25X30.  Post  intervention, there is a 30% residual stenosis. Diagnostic:  Dominance: Co-dominant     Intervention RECOMMENDATIONS   In the absence of any other complications or medical issues, we expect the patient to be ready for discharge from an interventional cardiology perspective on 10/05/2024.   Plan will be to reassess the patient in 60-90 days with relook catheterization to reassess the extent of flow down the RCA.   Recommend dual antiplatelet therapy. Alm Clay, MD   Assessment/Plan:   Rectal bleeding/IDA: Intermittent gastrointestinal bleeding with episodes of hematochezia approximately every two weeks. Recent bleeding increased quantity. Stools dark on iron. Hgb has been in the 12 range recently  dropped to 11.4. Colonoscopy and EGD 2023 as outlined.  -suspect rectal bleeding secondary to internal hemorrhoids. Continue to monitor for now. If increased frequency, would consider updating colonoscopy -for IDA will offer small bowel capsule study -update CBC, and check folate level in 2 weeks  Gastroesophageal reflux disease (GERD): Symptoms well-controlled with pantoprazole  twice daily.   Perianal/perineal rash: -ketoconazole 2% cream bid until rash gone   -keep appt with dermaotlogy      Sonny RAMAN. Ezzard, MHS, PA-C [facility]

## 2024-10-04 NOTE — Telephone Encounter (Signed)
Addressed at ov today 

## 2024-10-04 NOTE — Patient Instructions (Addendum)
 Stop current cream and try ketoconazole cream twice daily for 14 days until you can be seen by dermatology.   Please complete labs at [facility] in 2 weeks.  After labs, we will work on scheduling a capsule test to look at small intestines for source of your anemia.  Call if you have any further blood in the stool.  Continue iron every other day.

## 2024-10-04 NOTE — Telephone Encounter (Signed)
 Reviewed procedure instructions with patient.

## 2024-10-05 ENCOUNTER — Encounter (HOSPITAL_COMMUNITY): Admission: RE | Disposition: A | Payer: Self-pay | Source: Home / Self Care | Attending: Cardiology

## 2024-10-05 ENCOUNTER — Ambulatory Visit (HOSPITAL_COMMUNITY)
Admission: RE | Admit: 2024-10-05 | Discharge: 2024-10-06 | Disposition: A | Discharge status: Home / Self Care | Attending: Cardiology | Admitting: Cardiology

## 2024-10-05 ENCOUNTER — Other Ambulatory Visit: Payer: Self-pay

## 2024-10-05 ENCOUNTER — Encounter (HOSPITAL_COMMUNITY): Payer: Self-pay | Admitting: Cardiology

## 2024-10-05 DIAGNOSIS — E782 Mixed hyperlipidemia: Secondary | ICD-10-CM | POA: Insufficient documentation

## 2024-10-05 DIAGNOSIS — I2584 Coronary atherosclerosis due to calcified coronary lesion: Secondary | ICD-10-CM | POA: Insufficient documentation

## 2024-10-05 DIAGNOSIS — R0609 Other forms of dyspnea: Secondary | ICD-10-CM

## 2024-10-05 DIAGNOSIS — I10 Essential (primary) hypertension: Secondary | ICD-10-CM | POA: Diagnosis not present

## 2024-10-05 DIAGNOSIS — I25110 Atherosclerotic heart disease of native coronary artery with unstable angina pectoris: Secondary | ICD-10-CM | POA: Insufficient documentation

## 2024-10-05 DIAGNOSIS — Z955 Presence of coronary angioplasty implant and graft: Secondary | ICD-10-CM | POA: Diagnosis not present

## 2024-10-05 DIAGNOSIS — I2510 Atherosclerotic heart disease of native coronary artery without angina pectoris: Secondary | ICD-10-CM | POA: Diagnosis present

## 2024-10-05 DIAGNOSIS — I200 Unstable angina: Secondary | ICD-10-CM | POA: Diagnosis present

## 2024-10-05 DIAGNOSIS — Z79899 Other long term (current) drug therapy: Secondary | ICD-10-CM | POA: Diagnosis not present

## 2024-10-05 DIAGNOSIS — Z9861 Coronary angioplasty status: Secondary | ICD-10-CM

## 2024-10-05 HISTORY — PX: LEFT HEART CATH AND CORONARY ANGIOGRAPHY: CATH118249

## 2024-10-05 HISTORY — PX: CORONARY BALLOON ANGIOPLASTY: CATH118233

## 2024-10-05 LAB — POCT ACTIVATED CLOTTING TIME
Activated Clotting Time: 314 s
Activated Clotting Time: 250 s

## 2024-10-05 MED ORDER — SODIUM CHLORIDE 0.9 % IV SOLN: 250.0000 mL | INTRAVENOUS | Status: DC | PRN

## 2024-10-05 MED ORDER — VITAMIN B-12 1000 MCG PO TABS
500.0000 ug | ORAL_TABLET | Freq: Every day | ORAL | Status: DC
  Administered 2024-10-06: 500 ug via ORAL
  Filled 2024-10-05 (×2): qty 1

## 2024-10-05 MED ORDER — ACETAMINOPHEN 325 MG PO TABS: 650.0000 mg | ORAL_TABLET | ORAL | Status: DC | PRN

## 2024-10-05 MED ORDER — MIDAZOLAM HCL (PF) 2 MG/2ML IJ SOLN
INTRAMUSCULAR | Status: DC | PRN
  Administered 2024-10-05: 2 mg via INTRAVENOUS

## 2024-10-05 MED ORDER — ISOSORBIDE MONONITRATE ER 30 MG PO TB24
30.0000 mg | ORAL_TABLET | Freq: Two times a day (BID) | ORAL | Status: DC
Start: 2024-10-05 — End: 2024-10-06
  Administered 2024-10-05 – 2024-10-06 (×2): 30 mg via ORAL
  Filled 2024-10-05 (×2): qty 1

## 2024-10-05 MED ORDER — FUROSEMIDE 40 MG PO TABS: 40.0000 mg | ORAL_TABLET | Freq: Every day | ORAL | Status: DC | PRN

## 2024-10-05 MED ORDER — FREE WATER
500.0000 mL | Freq: Once | Status: AC
  Administered 2024-10-05: 500 mL via ORAL

## 2024-10-05 MED ORDER — MIDAZOLAM HCL 2 MG/2ML IJ SOLN
INTRAMUSCULAR | Status: AC
  Filled 2024-10-05: qty 2

## 2024-10-05 MED ORDER — SODIUM CHLORIDE 0.9% FLUSH: 3.0000 mL | INTRAVENOUS | Status: DC | PRN

## 2024-10-05 MED ORDER — HEPARIN SODIUM (PORCINE) 1000 UNIT/ML IJ SOLN
INTRAMUSCULAR | Status: DC | PRN
  Administered 2024-10-05 (×2): 5000 [IU] via INTRAVENOUS
  Administered 2024-10-05: 2000 [IU] via INTRAVENOUS

## 2024-10-05 MED ORDER — SODIUM CHLORIDE 0.9% FLUSH: 3.0000 mL | Freq: Two times a day (BID) | INTRAVENOUS | Status: DC

## 2024-10-05 MED ORDER — FENOFIBRATE 160 MG PO TABS
160.0000 mg | ORAL_TABLET | Freq: Every day | ORAL | Status: DC
  Administered 2024-10-05: 160 mg via ORAL
  Filled 2024-10-05: qty 1

## 2024-10-05 MED ORDER — NITROGLYCERIN 1 MG/10 ML FOR IR/CATH LAB
INTRA_ARTERIAL | Status: AC
  Filled 2024-10-05: qty 10

## 2024-10-05 MED ORDER — TRAZODONE HCL 50 MG PO TABS
50.0000 mg | ORAL_TABLET | Freq: Every day | ORAL | Status: DC
  Administered 2024-10-05: 50 mg via ORAL
  Filled 2024-10-05: qty 2

## 2024-10-05 MED ORDER — SODIUM CHLORIDE 0.9% FLUSH
3.0000 mL | Freq: Two times a day (BID) | INTRAVENOUS | Status: DC
  Administered 2024-10-05 – 2024-10-06 (×3): 3 mL via INTRAVENOUS

## 2024-10-05 MED ORDER — ROSUVASTATIN CALCIUM 5 MG PO TABS
10.0000 mg | ORAL_TABLET | Freq: Every day | ORAL | Status: DC
Start: 2024-10-06 — End: 2024-10-06
  Administered 2024-10-06: 10 mg via ORAL
  Filled 2024-10-05: qty 2

## 2024-10-05 MED ORDER — ALPRAZOLAM 0.25 MG PO TABS
2.0000 mg | ORAL_TABLET | Freq: Every day | ORAL | Status: DC
  Administered 2024-10-05: 2 mg via ORAL
  Filled 2024-10-05: qty 8

## 2024-10-05 MED ORDER — HEPARIN SODIUM (PORCINE) 1000 UNIT/ML IJ SOLN
INTRAMUSCULAR | Status: AC
  Filled 2024-10-05: qty 10

## 2024-10-05 MED ORDER — FENTANYL CITRATE (PF) 100 MCG/2ML IJ SOLN
INTRAMUSCULAR | Status: DC | PRN
  Administered 2024-10-05: 25 ug via INTRAVENOUS

## 2024-10-05 MED ORDER — LIDOCAINE HCL (PF) 1 % IJ SOLN
INTRAMUSCULAR | Status: AC
  Filled 2024-10-05: qty 30

## 2024-10-05 MED ORDER — LIDOCAINE HCL (PF) 1 % IJ SOLN
INTRAMUSCULAR | Status: DC | PRN
  Administered 2024-10-05: 2 mL via INTRADERMAL

## 2024-10-05 MED ORDER — CHOLECALCIFEROL 10 MCG (400 UNIT) PO TABS
400.0000 [IU] | ORAL_TABLET | Freq: Every day | ORAL | Status: DC
  Administered 2024-10-06: 400 [IU] via ORAL
  Filled 2024-10-05: qty 1

## 2024-10-05 MED ORDER — NITROGLYCERIN 0.4 MG SL SUBL: 0.4000 mg | SUBLINGUAL_TABLET | SUBLINGUAL | Status: DC | PRN

## 2024-10-05 MED ORDER — ONDANSETRON HCL 4 MG/2ML IJ SOLN: 4.0000 mg | Freq: Four times a day (QID) | INTRAMUSCULAR | Status: DC | PRN

## 2024-10-05 MED ORDER — VERAPAMIL HCL 2.5 MG/ML IV SOLN
INTRAVENOUS | Status: DC | PRN
  Administered 2024-10-05: 10 mL via INTRA_ARTERIAL

## 2024-10-05 MED ORDER — POTASSIUM CHLORIDE CRYS ER 10 MEQ PO TBCR
10.0000 meq | EXTENDED_RELEASE_TABLET | Freq: Every day | ORAL | Status: DC
  Administered 2024-10-05 – 2024-10-06 (×2): 10 meq via ORAL
  Filled 2024-10-05 (×4): qty 1

## 2024-10-05 MED ORDER — HYDRALAZINE HCL 20 MG/ML IJ SOLN: 10.0000 mg | INTRAMUSCULAR | Status: AC | PRN

## 2024-10-05 MED ORDER — NITROGLYCERIN 1 MG/10 ML FOR IR/CATH LAB
INTRA_ARTERIAL | Status: DC | PRN
  Administered 2024-10-05: 100 ug via INTRACORONARY

## 2024-10-05 MED ORDER — ASPIRIN 81 MG PO CHEW
81.0000 mg | CHEWABLE_TABLET | ORAL | Status: AC
  Administered 2024-10-05: 81 mg via ORAL
  Filled 2024-10-05: qty 1

## 2024-10-05 MED ORDER — NAPHAZOLINE-GLYCERIN 0.012-0.25 % OP SOLN: 1.0000 [drp] | Freq: Four times a day (QID) | OPHTHALMIC | Status: DC | PRN

## 2024-10-05 MED ORDER — BIOTIN 800 MCG PO TABS: 800.0000 ug | ORAL_TABLET | Freq: Every day | ORAL | Status: DC

## 2024-10-05 MED ORDER — VERAPAMIL HCL 2.5 MG/ML IV SOLN
INTRAVENOUS | Status: AC
  Filled 2024-10-05: qty 2

## 2024-10-05 MED ORDER — FENTANYL CITRATE (PF) 100 MCG/2ML IJ SOLN
INTRAMUSCULAR | Status: AC
  Filled 2024-10-05: qty 2

## 2024-10-05 MED ORDER — IOHEXOL 350 MG/ML SOLN
INTRAVENOUS | Status: DC | PRN
Start: 2024-10-05 — End: 2024-10-05
  Administered 2024-10-05: 130 mL

## 2024-10-05 MED ORDER — METOPROLOL SUCCINATE ER 25 MG PO TB24
25.0000 mg | ORAL_TABLET | Freq: Every day | ORAL | Status: DC
Start: 2024-10-06 — End: 2024-10-06
  Administered 2024-10-06: 25 mg via ORAL
  Filled 2024-10-05: qty 1

## 2024-10-05 MED ORDER — MAGNESIUM OXIDE -MG SUPPLEMENT 400 (240 MG) MG PO TABS
400.0000 mg | ORAL_TABLET | Freq: Every day | ORAL | Status: DC
  Administered 2024-10-05 – 2024-10-06 (×2): 400 mg via ORAL
  Filled 2024-10-05 (×6): qty 1

## 2024-10-05 MED ORDER — ASPIRIN 81 MG PO TBEC
81.0000 mg | DELAYED_RELEASE_TABLET | Freq: Every day | ORAL | Status: DC
  Administered 2024-10-06: 81 mg via ORAL
  Filled 2024-10-05 (×2): qty 1

## 2024-10-05 MED ORDER — HYDROCODONE-ACETAMINOPHEN 10-325 MG PO TABS
1.0000 | ORAL_TABLET | Freq: Four times a day (QID) | ORAL | Status: DC | PRN
  Administered 2024-10-05: 1 via ORAL
  Administered 2024-10-06: 2 via ORAL
  Filled 2024-10-05 (×2): qty 2

## 2024-10-05 MED ORDER — FINASTERIDE 5 MG PO TABS
5.0000 mg | ORAL_TABLET | Freq: Every day | ORAL | Status: DC
Start: 2024-10-06 — End: 2024-10-06
  Administered 2024-10-06: 5 mg via ORAL
  Filled 2024-10-05: qty 1

## 2024-10-05 MED ORDER — CLOPIDOGREL BISULFATE 75 MG PO TABS
75.0000 mg | ORAL_TABLET | Freq: Every day | ORAL | Status: DC
  Administered 2024-10-06: 75 mg via ORAL
  Filled 2024-10-05: qty 1

## 2024-10-05 MED ORDER — LABETALOL HCL 5 MG/ML IV SOLN: 10.0000 mg | INTRAVENOUS | Status: AC | PRN

## 2024-10-05 MED ORDER — ZINC SULFATE 220 (50 ZN) MG PO CAPS
220.0000 mg | ORAL_CAPSULE | Freq: Every day | ORAL | Status: DC
Start: 2024-10-06 — End: 2024-10-06
  Administered 2024-10-06: 220 mg via ORAL
  Filled 2024-10-05: qty 1

## 2024-10-05 MED ORDER — FERROUS SULFATE 325 (65 FE) MG PO TABS
325.0000 mg | ORAL_TABLET | Freq: Every day | ORAL | Status: DC
Start: 2024-10-05 — End: 2024-10-06
  Administered 2024-10-06: 325 mg via ORAL
  Filled 2024-10-05: qty 1

## 2024-10-05 MED ORDER — HEPARIN (PORCINE) IN NACL 1000-0.9 UT/500ML-% IV SOLN
INTRAVENOUS | Status: DC | PRN
  Administered 2024-10-05: 1000 mL

## 2024-10-05 MED ORDER — PANTOPRAZOLE SODIUM 40 MG PO TBEC
40.0000 mg | DELAYED_RELEASE_TABLET | Freq: Two times a day (BID) | ORAL | Status: DC
  Administered 2024-10-05 – 2024-10-06 (×2): 40 mg via ORAL
  Filled 2024-10-05 (×2): qty 1

## 2024-10-05 NOTE — Plan of Care (Signed)
  Problem: Education: Goal: Individualized Educational Video(s) Outcome: Progressing   

## 2024-10-05 NOTE — Interval H&P Note (Signed)
 History and Physical Interval Note:  10/05/2024 11:41 AM  Dartanian CHRISTELLA Maiden  has presented today for surgery, with the diagnosis of angina.  The various methods of treatment have been discussed with the patient and family. After consideration of risks, benefits and other options for treatment, the patient has consented to  Procedure(s): LEFT HEART CATH AND CORONARY ANGIOGRAPHY (N/A)  PERCUTANEOUS CORONARY INTERVENTION   as a surgical intervention.  The patient's history has been reviewed, patient examined, no change in status, stable for surgery.  I have reviewed the patient's chart and labs.  Questions were answered to the patient's satisfaction.    Cath Lab Visit (complete for each Cath Lab visit)  Clinical Evaluation Leading to the Procedure:   ACS: No.  Non-ACS:    Anginal Classification: CCS III  Anti-ischemic medical therapy: Minimal Therapy (1 class of medications)  Non-Invasive Test Results: Intermediate-risk stress test findings: cardiac mortality 1-3%/year  Prior CABG: No previous CABG    Alm Clay

## 2024-10-05 NOTE — Progress Notes (Signed)
 Discussed with pt restrictions, Plavix  importance, diet, exercise, NTG and CRPII. Pt receptive. Will refer to [facility] CRPII. He works during the day therefore its difficult to do program.  [phone] Aliene Aris BS, ACSM-CEP 10/05/2024 3:35 PM

## 2024-10-06 ENCOUNTER — Encounter (HOSPITAL_COMMUNITY): Payer: Self-pay | Admitting: Cardiology

## 2024-10-06 DIAGNOSIS — I200 Unstable angina: Secondary | ICD-10-CM

## 2024-10-06 DIAGNOSIS — I25110 Atherosclerotic heart disease of native coronary artery with unstable angina pectoris: Secondary | ICD-10-CM | POA: Diagnosis not present

## 2024-10-06 DIAGNOSIS — E782 Mixed hyperlipidemia: Secondary | ICD-10-CM | POA: Diagnosis not present

## 2024-10-06 DIAGNOSIS — Z79899 Other long term (current) drug therapy: Secondary | ICD-10-CM | POA: Diagnosis not present

## 2024-10-06 DIAGNOSIS — I10 Essential (primary) hypertension: Secondary | ICD-10-CM | POA: Diagnosis not present

## 2024-10-06 LAB — BASIC METABOLIC PANEL WITH GFR
Anion gap: 11 (ref 5–15)
BUN: 11 mg/dL (ref 6–20)
CO2: 24 mmol/L (ref 22–32)
Calcium: 9.1 mg/dL (ref 8.9–10.3)
Chloride: 101 mmol/L (ref 98–111)
Creatinine, Ser: 1.1 mg/dL (ref 0.61–1.24)
GFR, Estimated: >60 mL/min (ref >60)
Glucose, Bld: 103 mg/dL — ABNORMAL HIGH (ref 70–99)
Potassium: 3.6 mmol/L (ref 3.5–5.1)
Sodium: 136 mmol/L (ref 135–145)

## 2024-10-06 LAB — CBC
HCT: 36.8 % — ABNORMAL LOW (ref 39.0–52.0)
Hemoglobin: 12.1 g/dL — ABNORMAL LOW (ref 13.0–17.0)
MCH: 28.7 pg (ref 26.0–34.0)
MCHC: 32.9 g/dL (ref 30.0–36.0)
MCV: 87.4 fL (ref 80.0–100.0)
Platelets: 246 K/uL (ref 150–400)
RBC: 4.21 MIL/uL — ABNORMAL LOW (ref 4.22–5.81)
RDW: 14.4 % (ref 11.5–15.5)
WBC: 4 K/uL (ref 4.0–10.5)
nRBC: 0 % (ref 0.0–0.2)

## 2024-10-06 NOTE — Progress Notes (Signed)
 Pt declined ambulating hallway, pt received education and referral to CR yesterday. Will s/o for now.   Garen FORBES Candy MS, ACSM-CEP  10/06/2024 9:19 AM

## 2024-10-06 NOTE — Discharge Summary (Signed)
 Discharge Summary   Patient ID: Paul Arias MRN: [id number]; DOB: 02/01/1966  Admit date: 10/05/2024 Discharge date: 10/06/2024  PCP:  Shona Norleen PEDLAR, MD    [facility] Providers Cardiologist:  Jayson Sierras, MD     Discharge Diagnoses  Principal Problem:   CAD S/P percutaneous coronary angioplasty Active Problems:   Unstable angina [facility])  Diagnostic Studies/Procedures   Cath: 10/05/2024    Previously placed DES in the prox LAD to Mid LAD is widely patent   1st Diag lesion is 40% stenosed.   Mid Cx to Dist Cx lesion is 60% stenosed.  Stable from last cath   Ost RCA to Prox RCA lesion is 99% stenosed.   Drug-coated balloon angioplasty was performed using a BALL DC AGENT 2.25X30.  Post intervention, there is a 30% residual stenosis.   Diagnostic:   Dominance: Co-dominant                                                Intervention    RECOMMENDATIONS   In the absence of any other complications or medical issues, we expect the patient to be ready for discharge from an interventional cardiology perspective on 10/05/2024.   Plan will be to reassess the patient in 60-90 days with relook catheterization to reassess the extent of flow down the RCA.   Recommend dual antiplatelet therapy.       Alm Clay, MD   _____________   History of Present Illness   Paul Arias is a 58 y.o. male with past medical history of CAD with chronically occluded RCA with associated left-to-right collaterals, hypertension and hyperlipidemia who was seen in the office on 10/22 by Dr. Sierras.  He had previously been seen back in September and discussed proceeding with diagnostic cardiac catheterization at that time given increased dyspnea on exertion and worsening stamina concerning for progressive angina.  His procedure was declined by his insurance provider and he ultimately underwent a Lexiscan  Myoview  which demonstrated inferior/inferior septal infarct scar and no active  ischemia.  Attempts were made to increase his antianginal therapy but presented back with ongoing symptoms. At that time it was recommended that he proceed with cardiac catheterization to ensure LAD patency and no compromise of left-to-right collaterals.   Hospital Course    Presented for outpatient cardiac catheterization 10/29 and noted to have patent previously placed proximal/mid LAD stent with 99% stenosis of ostial/proximal RCA that was treated with balloon angioplasty.  It was recommended that he continue on DAPT with aspirin /Plavix  with recommendations to bring back for reassessment in 60 to 90 days with relook catheterization to assess the extent of flow down the RCA.  No recurrent chest pain postprocedure.  Seen by cardiac rehab.  General: Well developed, well nourished, male appearing in no acute distress. Head: Normocephalic, atraumatic.  Neck: Supple without bruits, JVD. Lungs:  Resp regular and unlabored, CTA. Heart: RRR, S1, S2, no S3, S4, or murmur; no rub. Abdomen: Soft, non-tender, non-distended with normoactive bowel sounds. Extremities: No clubbing, cyanosis, edema. Distal pedal pulses are 2+ bilaterally. Right radial cath site stable without bruising or hematoma Neuro: Alert and oriented X 3. Moves all extremities spontaneously. Psych: Normal affect.  Was seen by Dr. Verlin and deemed stable for discharge home.  Follow-up arranged in the office with Dr. Clay to determine timing of relook cardiac catheterization.  Medication  sent to [facility] pharmacy.  Educated by [employer].D. prior to discharge.  Did the patient have an acute coronary syndrome (MI, NSTEMI, STEMI, etc) this admission?:  No                               Did the patient have a percutaneous coronary intervention (stent / angioplasty)?:  Yes.     Cath/PCI Registry Performance & Quality Measures: Aspirin  prescribed? - Yes ADP Receptor Inhibitor (Plavix /Clopidogrel , Brilinta/Ticagrelor or Effient/Prasugrel)  prescribed (includes medically managed patients)? - Yes High Intensity Statin (Lipitor 40-80mg  or Crestor  20-40mg ) prescribed? - No - on repatha  For EF <40%, was ACEI/ARB prescribed? - Not Applicable (EF >/= 40%) For EF <40%, Aldosterone Antagonist (Spironolactone or Eplerenone) prescribed? - Not Applicable (EF >/= 40%) Cardiac Rehab Phase II ordered? - Yes       The patient will be scheduled for a TOC follow up appointment in 10-14 days.  A message has been sent to the [facility] and Scheduling Pool at the office where the patient should be seen for follow up.  _____________  Discharge Vitals Blood pressure 135/76, pulse 68, temperature (!) 97.4 F (36.3 C), temperature source Oral, resp. rate 16, height 6' 3 (1.905 m), weight 98.4 kg, SpO2 96%.  Filed Weights   10/05/24 0856  Weight: 98.4 kg    Labs & Radiologic Studies  CBC Recent Labs    10/06/24 0222  WBC 4.0  HGB 12.1*  HCT 36.8*  MCV 87.4  PLT 246   Basic Metabolic Panel Recent Labs    89/69/74 0222  NA 136  K 3.6  CL 101  CO2 24  GLUCOSE 103*  BUN 11  CREATININE 1.10  CALCIUM  9.1   Liver Function Tests No results for input(s): AST, ALT, ALKPHOS, BILITOT, PROT, ALBUMIN in the last 72 hours. No results for input(s): LIPASE, AMYLASE in the last 72 hours. High Sensitivity Troponin:   No results for input(s): TROPONINIHS in the last 720 hours.  No results for input(s): TRNPT in the last 720 hours.  BNP Invalid input(s): POCBNP No results for input(s): PROBNP in the last 72 hours.  No results for input(s): BNP in the last 72 hours.  D-Dimer No results for input(s): DDIMER in the last 72 hours. Hemoglobin A1C No results for input(s): HGBA1C in the last 72 hours. Fasting Lipid Panel No results for input(s): CHOL, HDL, LDLCALC, TRIG, CHOLHDL, LDLDIRECT in the last 72 hours. Lipoprotein (a)  Date/Time Value Ref Range Status  03/04/2022 08:33 AM <8.4 <75.0 nmol/L Final     Comment:    (NOTE) **Results verified by repeat testing** Note:  Values greater than or equal to 75.0 nmol/L may       indicate an independent risk factor for CHD,       but must be evaluated with caution when applied       to non-Caucasian populations due to the       influence of genetic factors on Lp(a) across       ethnicities. Performed At: [facility] [address] [place], [place] [id number] Jennette Shorter MD Ey:[phone]     Thyroid Function Tests No results for input(s): TSH, T4TOTAL, T3FREE, THYROIDAB in the last 72 hours.  Invalid input(s): FREET3 _____________  CARDIAC CATHETERIZATION Result Date: 10/05/2024 Images from the original result were not included.   Previously placed DES in the prox LAD to Mid LAD is widely patent   1st  Diag lesion is 40% stenosed.   Mid Cx to Dist Cx lesion is 60% stenosed.  Stable from last cath   Ost RCA to Prox RCA lesion is 99% stenosed.   Drug-coated balloon angioplasty was performed using a BALL DC AGENT 2.25X30.  Post intervention, there is a 30% residual stenosis. Diagnostic:  Dominance: Co-dominant     Intervention RECOMMENDATIONS   In the absence of any other complications or medical issues, we expect the patient to be ready for discharge from an interventional cardiology perspective on 10/05/2024.   Plan will be to reassess the patient in 60-90 days with relook catheterization to reassess the extent of flow down the RCA.   Recommend dual antiplatelet therapy. Alm Clay, MD   Disposition Pt is being discharged home today in good condition.  Follow-up Plans & Appointments  Discharge Instructions     Amb Referral to Cardiac Rehabilitation   Complete by: As directed    Diagnosis: PTCA   After initial evaluation and assessments completed: Virtual Based Care may be provided alone or in conjunction with Phase 2 Cardiac Rehab based on patient barriers.: Yes   Intensive Cardiac Rehabilitation (ICR) [facility] location  only OR Traditional Cardiac Rehabilitation (TCR) *If criteria for ICR are not met will enroll in TCR [facility] only): Yes   Diet - low sodium heart healthy   Complete by: As directed    Discharge instructions   Complete by: As directed    Radial Site Care Refer to this sheet in the next few weeks. These instructions provide you with information on caring for yourself after your procedure. Your caregiver may also give you more specific instructions. Your treatment has been planned according to current medical practices, but problems sometimes occur. Call your caregiver if you have any problems or questions after your procedure. HOME CARE INSTRUCTIONS You may shower the day after the procedure. Remove the bandage (dressing) and gently wash the site with plain soap and water . Gently pat the site dry.  Do not apply powder or lotion to the site.  Do not submerge the affected site in water  for 3 to 5 days.  Inspect the site at least twice daily.  Do not flex or bend the affected arm for 24 hours.  No lifting over 5 pounds (2.3 kg) for 5 days after your procedure.  Do not drive home if you are discharged the same day of the procedure. Have someone else drive you.  You may drive 24 hours after the procedure unless otherwise instructed by your caregiver.  What to expect: Any bruising will usually fade within 1 to 2 weeks.  Blood that collects in the tissue (hematoma) may be painful to the touch. It should usually decrease in size and tenderness within 1 to 2 weeks.  SEEK IMMEDIATE MEDICAL CARE IF: You have unusual pain at the radial site.  You have redness, warmth, swelling, or pain at the radial site.  You have drainage (other than a small amount of blood on the dressing).  You have chills.  You have a fever or persistent symptoms for more than 72 hours.  You have a fever and your symptoms suddenly get worse.  Your arm becomes pale, cool, tingly, or numb.  You have heavy bleeding from the site. Hold  pressure on the site.   Increase activity slowly   Complete by: As directed        Discharge Medications Allergies as of 10/06/2024   No Known Allergies  Medication List     TAKE these medications    ALPRAZolam  1 MG tablet Commonly known as: XANAX  Take 2 mg by mouth at bedtime.   aspirin  EC 81 MG tablet Take 1 tablet (81 mg total) by mouth daily. Swallow whole.   Biotin  800 MCG Tabs Take 800 mcg by mouth daily.   clopidogrel  75 MG tablet Commonly known as: Plavix  Take 1 tablet (75 mg total) by mouth daily.   fenofibrate  160 MG tablet Take 160 mg by mouth at bedtime.   ferrous sulfate 325 (65 FE) MG tablet Take 325 mg by mouth daily with breakfast.   finasteride 5 MG tablet Commonly known as: PROSCAR Take 5 mg by mouth daily.   furosemide  40 MG tablet Commonly known as: LASIX  Take 1 tablet (40 mg total) by mouth daily for 3 days, THEN 1 tablet (40 mg total) daily as needed. Start taking on: May 30, 2024   HYDROcodone -acetaminophen  10-325 MG tablet Commonly known as: NORCO Take 1 tablet by mouth in the morning, at noon, in the evening, and at bedtime.   isosorbide  mononitrate 30 MG 24 hr tablet Commonly known as: IMDUR  Take 1 tablet (30 mg total) by mouth 2 (two) times daily.   ketoconazole 2 % cream Commonly known as: NIZORAL Apply 1 Application topically 2 (two) times daily. For 2 weeks   magnesium  oxide 400 MG tablet Commonly known as: MAG-OX Take 400 mg by mouth daily.   metoprolol  succinate 25 MG 24 hr tablet Commonly known as: TOPROL -XL TAKE 1 TABLET BY MOUTH ONCE DAILY.   nitroGLYCERIN  0.4 MG SL tablet Commonly known as: Nitrostat  Place 1 tablet (0.4 mg total) under the tongue every 5 (five) minutes as needed for chest pain.   nystatin cream Commonly known as: MYCOSTATIN Apply 1 Application topically 2 (two) times daily.   pantoprazole  40 MG tablet Commonly known as: PROTONIX  Take 40 mg by mouth 2 (two) times daily.   potassium  chloride 10 MEQ tablet Commonly known as: KLOR-CON  Take 10 mEq by mouth daily.   Repatha  SureClick 140 MG/ML Soaj Generic drug: Evolocumab  Inject 140 mg into the skin every 14 (fourteen) days.   rosuvastatin  10 MG tablet Commonly known as: CRESTOR  Take 1 tablet (10 mg total) by mouth daily.   tetrahydrozoline 0.05 % ophthalmic solution Place 1 drop into both eyes as needed (dry eyes).   traZODone  50 MG tablet Commonly known as: DESYREL  Take 50-100 mg by mouth at bedtime.   vitamin B-12 500 MCG tablet Commonly known as: CYANOCOBALAMIN Take 500 mcg by mouth daily.   VITAMIN K2-VITAMIN D3 PO Take 1 capsule by mouth daily.   zinc  gluconate 50 MG tablet Take 50 mg by mouth daily.         Outstanding Labs/Studies  N/a   Duration of Discharge Encounter: APP Time: 20 minutes   Signed, Manuelita Rummer, NP 10/06/2024, 10:25 AM  Paul Arias was seen by me today along with Manuelita Charleston, NP. I have personally performed an evaluation on this patient.  My findings are as follows: 58 y.o. male with CAD, HTN, HLD with recent chest pain c/w unstable angina. Cardiac cath yesterday per Dr. Anner with severe RCA stenosis treated with drug coated balloon angioplasty Doing well today. No chest pain or dyspnea  Data: EKG(s) and pertinent labs, studies, etc were personally reviewed and interpreted by me:  EKG NSR, LPFB-personally reviewed Tele:sinus-personally reviewed Labs reviewed Otherwise, I agree with data as outlined by the advanced practice provider.  Exam performed by me: Gen: NAD Neck: No JVD Cardiac: RRR Lungs: clear bilat Extremities: no LE edema  My Assessment and Plan:  CAD with unstable angina: s/p drug coated balloon angioplasty RCA yesterday. Doing well today. No chest pain. Cath site ok. Continue DAPT with ASA and Plavix  for a year. F/u dr. Anner to discuss relook cath in several months to evaluate the RCA post DEB angioplasty and see if there is  a need for a stent.   D/c home today  I have spent 30 minutes on chart review, note review, lab review, EKG review, cath film review, examination, plan formulation and note formulation.    Signed,  Lonni Cash, MD  10/06/2024 10:25 AM

## 2024-10-06 NOTE — Progress Notes (Signed)
Explained discharge instructions to patient. Reviewed follow up appointment and next medication administration times. Also reviewed education. Patient verbalized having an understanding for instructions given. All belongings are in the patient's possession. IV and telemetry were removed. CCMD was notified. No other needs verbalized. Transported downstairs for discharge. 

## 2024-10-06 NOTE — Plan of Care (Signed)

## 2024-10-10 ENCOUNTER — Encounter: Payer: Self-pay | Admitting: Gastroenterology

## 2024-10-12 ENCOUNTER — Telehealth: Payer: Self-pay | Admitting: Gastroenterology

## 2024-10-12 NOTE — Telephone Encounter (Signed)
 Patient came into the office to pick up paperwork and he said to let Sonny know that last Wed., 10/29 he had a catherization and he is ready to swallow the little camera.  He said that Sonny wanted to know when the catherization was complete

## 2024-10-13 ENCOUNTER — Ambulatory Visit: Payer: Self-pay | Admitting: Gastroenterology

## 2024-10-13 LAB — CBC WITH DIFFERENTIAL/PLATELET
Basophils Absolute: 0 x10E3/uL (ref 0.0–0.2)
Basos: 0 %
EOS (ABSOLUTE): 0.1 x10E3/uL (ref 0.0–0.4)
Eos: 1 %
Hematocrit: 42.6 % (ref 37.5–51.0)
Hemoglobin: 13.4 g/dL (ref 13.0–17.7)
Immature Grans (Abs): 0 x10E3/uL (ref 0.0–0.1)
Immature Granulocytes: 0 %
Lymphocytes Absolute: 1.6 x10E3/uL (ref 0.7–3.1)
Lymphs: 24 %
MCH: 28.7 pg (ref 26.6–33.0)
MCHC: 31.5 g/dL (ref 31.5–35.7)
MCV: 91 fL (ref 79–97)
Monocytes Absolute: 0.6 x10E3/uL (ref 0.1–0.9)
Monocytes: 10 %
Neutrophils Absolute: 4.1 x10E3/uL (ref 1.4–7.0)
Neutrophils: 65 %
Platelets: 314 x10E3/uL (ref 150–450)
RBC: 4.67 x10E6/uL (ref 4.14–5.80)
RDW: 14.5 % (ref 11.6–15.4)
WBC: 6.3 x10E3/uL (ref 3.4–10.8)

## 2024-10-13 LAB — FOLATE: Folate: 12.2 ng/mL (ref >3.0)

## 2024-10-16 NOTE — Telephone Encounter (Signed)
 Ok to schedule small bowel capsule study for IDA. Do not stop ASA/Plavix .

## 2024-10-17 NOTE — Telephone Encounter (Signed)
 Spoke with pt. He has been scheduled for 11/14/24. Aware will send instructions.

## 2024-10-17 NOTE — Telephone Encounter (Signed)
 PA approved via [facility] CPT Code GECAP Description: Capsule Endoscopy Authorization Number: [id number] Case Number: [id number] Review Date: 10/17/2024 10:49:55 AM Expiration Date: 01/15/2025 Status: Your case has been Approved.

## 2024-10-18 ENCOUNTER — Other Ambulatory Visit: Payer: Self-pay | Admitting: Cardiology

## 2024-10-26 ENCOUNTER — Encounter: Payer: Self-pay | Admitting: Cardiology

## 2024-10-26 ENCOUNTER — Ambulatory Visit: Attending: Cardiology | Admitting: Cardiology

## 2024-10-26 VITALS — BP 122/70 | HR 63 | Ht 75.0 in | Wt 212.6 lb

## 2024-10-26 DIAGNOSIS — K264 Chronic or unspecified duodenal ulcer with hemorrhage: Secondary | ICD-10-CM

## 2024-10-26 DIAGNOSIS — I2510 Atherosclerotic heart disease of native coronary artery without angina pectoris: Secondary | ICD-10-CM | POA: Diagnosis not present

## 2024-10-26 DIAGNOSIS — E782 Mixed hyperlipidemia: Secondary | ICD-10-CM | POA: Diagnosis not present

## 2024-10-26 DIAGNOSIS — Z9861 Coronary angioplasty status: Secondary | ICD-10-CM | POA: Diagnosis not present

## 2024-10-26 DIAGNOSIS — R0609 Other forms of dyspnea: Secondary | ICD-10-CM

## 2024-10-26 DIAGNOSIS — I25119 Atherosclerotic heart disease of native coronary artery with unspecified angina pectoris: Secondary | ICD-10-CM | POA: Diagnosis not present

## 2024-10-26 MED ORDER — METOPROLOL SUCCINATE ER 25 MG PO TB24: 25.0000 mg | ORAL_TABLET | Freq: Every day | ORAL | 1 refills | Status: DC

## 2024-10-26 NOTE — Progress Notes (Signed)
 Cardiology Office Note:  .   Date:  10/29/2024  ID:  Paul Arias, DOB 09-24-1966, MRN [id number] PCP: Shona Norleen PEDLAR, MD  [facility] Providers Cardiologist:  Jayson Sierras, MD     Chief Complaint  Patient presents with   Hospitalization Follow-up    Post cath follow-up   Coronary Artery Disease    Exertional dyspnea improved post PTCA, still notes fatigue    Patient Profile: Paul Arias     Paul Arias is a  58 y.o. male  with a PMH noted below who presents here for post cath-PTCA follow-up at the request of Dr. Sierras  PMH: CAD: RCA CTO, DES PCI to proximal-mid LAD (December 2024) DEB PTCA of sub-CTO of RCA November 2025 restoring inline flow HTN HLD     Paul Arias was referred for cardiac catheterization poss PCI  by Dr. Ruta ongoing symptoms of exertional dyspnea and fatigue.  He recently had cardiac catheterization with DEB PTCA of subtotal CTO of codominant RCA, and presents to discuss results.  Subjective  Discussed the use of AI scribe software for clinical note transcription with the patient, who gave verbal consent to proceed.  History of Present Illness Paul Arias is a 58 year old male with coronary artery disease who presents with leg weakness and fatigue. He was referred by Dr. Sierras for cardiac catheterization-PCI. He experiences leg weakness and fatigue, which he attributes to a low blood count persisting for two to three years. His legs feel tired, and he sometimes lacks energy. He is concerned about potential anemia due to passing blood and is scheduled for a capsule endoscopy to investigate possible bleeding in his small intestines.  He has a history of coronary artery disease and recently underwent a procedure to treat his heart condition. He is currently on aspirin  and Plavix  and has noticed blood in his stool since starting these medications. He also bleeds easily when cut, which he attributes to the  Plavix .  His current medications include Repatha , Crestor , and furosemide  as needed. He was previously prescribed Inderal at two tablets a day but did not notice any difference in his symptoms. He is also on metoprolol , which he suspects may contribute to his fatigue and drowsiness.  He reports improvement in breathing but continues to experience leg weakness and fatigue.  Cardiovascular ROS: positive for - notably improved exertional dyspnea but still has persistent fatigue and leg weakness.  Not back to baseline. negative for - edema, irregular heartbeat, orthopnea, palpitations, paroxysmal nocturnal dyspnea, rapid heart rate, shortness of breath, or syncope or near severe TIA or amaurosis fugax.  Claudication.     Objective   Pertinent CV medications - Aspirin  81 mg daily;- Plavix  75 mg daily - Toprol -XL 25 mg daily - Imdur  recently increased to 30 mg twice daily - Crestor  10 mg daily;- Repatha  140 mg every 2 weeks; fenofibrate  160 mg daily - PRN furosemide  40 mg    Studies Reviewed: Paul Arias   EKG Interpretation Date/Time:  Wednesday October 26 2024 14:12:15 EST Ventricular Rate:  63 PR Interval:  152 QRS Duration:  90 QT Interval:  414 QTC Calculation: 423 R Axis:   55  Text Interpretation: Normal sinus rhythm Normal ECG When compared with ECG of 06-Oct-2024 05:06, Left posterior fascicular block is no longer Present Confirmed by Anner Lenis ([id number]) on 10/29/2024 5:48:35 PM    Lab Results  Component Value Date   NA 136 10/06/2024   K 3.6 10/06/2024   CREATININE  1.10 10/06/2024   GFRNONAA >60 10/06/2024   GLUCOSE 103 (H) 10/06/2024  Labs from PCP August 2025 noted LDL 78 on Repatha  and 10 mg rosuvastatin   Cardiac Studies: Coronary CTA (03/27/2020): CAC 1740.  Heavily calcified coronary arteries.  Severe stenosis of proximal ostial RCA as well as mid LAD (7099%) heavily calcified plaque in the LCx with moderate stenosis.  FFR ct LCx 0.9, proximal LAD 0.91 tapers to 0.75  distally.  Unable to assess RCA due to significant artifact. Cardiac Cath 04/11/2020: Subtotal CTO of RCA with left-to-right collaterals.  Moderate calcific stenosis proximal to mid LAD.  Widely patent LCx/OM. => Recommended medical management Myoview  09/18/2023: EF 55 to 60%.  Large sized moderate intensity inferior wall defect with mild reversibility suggestive of artifact.  LOW RISK. Cardiac Cath-PCI 11/20/2023: Proximal to mid LAD 70% calcified-IFR 0.88 => DES PCI with Synergy XD 3.0 x 24 mm-postdilated to 3.3 mm.). Ost-Prox RCA ~100%. D1 40%. Mid-dist LCx 50%.  Myoview  08/25/2024: Prior inferior for septal infarction but no ischemia.  LOW RISK.  EF 55 Referred for cardiac catheterization due to ongoing symptoms  Cardiac Cath and PCI 10/05/2024: Widely patent proximal to mid LAD stent.  D1 stable 40%.  Mid to distal LCx 60%.  Recanalization of the ostial to proximal RCA (previously noted to be subtotal occluded) => successful DEB angioplasty reducing 99% to 30% using 2.25 mm x 30 mm Agent DEB. Initial plan to be to consider reassessing with relook angiography in 2 to 3 months if symptoms warrant.  Diagnostic:   Dominance: Co-dominant                                                Intervention   Risk Assessment/Calculations:           Physical Exam:   VS:  BP 122/70 (BP Location: Left Arm, Patient Position: Sitting, Cuff Size: Large)   Pulse 63   Ht 6' 3 (1.905 m)   Wt 212 lb 9.6 oz (96.4 kg)   SpO2 94%   BMI 26.57 kg/m    Wt Readings from Last 3 Encounters:  10/26/24 212 lb 9.6 oz (96.4 kg)  10/05/24 217 lb (98.4 kg)  10/04/24 215 lb 6.4 oz (97.7 kg)     GEN: Well nourished, well groomed in no acute distress; healthy-appearing NECK: No JVD; No carotid bruits CARDIAC: Normal S1, S2; RRR, no murmurs, rubs, gallops RESPIRATORY:  Clear to auscultation without rales, wheezing or rhonchi ; nonlabored, good air movement. ABDOMEN: Soft, non-tender, non-distended EXTREMITIES:  No edema;  No deformity; radial access site clean dry intact. ;    ASSESSMENT AND PLAN: .    Problem List Items Addressed This Visit       Cardiology Problems   CAD S/P percutaneous coronary angioplasty - Primary (Chronic)   Prior DES PCI to the LAD with widely patent stent.  Now with DEB PTCA of previously subtotaled RCA. Not a very large RCA, and Myoview  did not suggest inferior ischemia-suggested prior infarct.   However, post PTCA he does note some improvement in his exertional dyspnea.   Otherwise there is no notable disease to treat--moderate disease in the LCx but not likely flow-limiting.  S/p drug-eluting balloon intervention with improved breathing and no significant stenosis on stress test. Current vessel size is 2.25 mm, small for stenting. Recent trial supports drug-eluting balloon as  effective as stent for small vessels. No immediate need for re-intervention unless symptoms change. Original plans to relook in 2 to 3 months probably unnecessary based on recent studies indicating noninferiority between DEB PTCA and DES PCI for small vessels. Okay to stop aspirin  as of January (3 months post PTCA) With recent PTCA, would need to be on DAPT ASA/Plavix  for at least 3 months, but would prefer to stay on maintenance Plavix  monotherapy longer-term.      Relevant Medications   metoprolol  succinate (TOPROL  XL) 25 MG 24 hr tablet   Other Relevant Orders   EKG 12-Lead (Completed)   Coronary artery disease involving native coronary artery of native heart with angina pectoris (Chronic)   Three-vessel disease with widely patent LAD stent and now DEB PTCA to the RCA with moderate disease in the LCx.  He never really had true angina type chest pain symptoms-just mostly noticed exertional dyspnea and fatigue.  The exertional dyspnea part has definitely improved but he still has fatigue. - Continue isosorbide  at 30 mg twice daily. - Reduced metoprolol  dose to half tablet to assess impact on energy  levels. - Stop aspirin  on January 1st, 2026, continue Plavix  until then. - Reassess symptoms and vessel status at three-month follow-up.      Relevant Medications   metoprolol  succinate (TOPROL  XL) 25 MG 24 hr tablet   Mixed hyperlipidemia (Chronic)   Managed by Dr. Debera with combination of fenofibrate  and 60 mg daily, rosuvastatin  10 berry daily and Repatha  every 2 weeks.  Not quite at goal, notable improvement..      Relevant Medications   metoprolol  succinate (TOPROL  XL) 25 MG 24 hr tablet     Other   DOE (dyspnea on exertion); and fatigue (Chronic)   Exertional dyspnea and fatigue felt to be potentially his anginal equivalent.  Post PTCA, he does note dyspnea able to walk up steps without having to stop now but still has a fatigue and leg weakness.  Fatigue possibly related to medication or anemia Fatigue potentially related to metoprolol  or anemia. Metoprolol  dose reduction may improve energy levels. Anemia under evaluation by gastroenterologist. - Reduced metoprolol  dose to half tablet to assess impact on energy levels. - Monitor fatigue levels and reassess if symptoms persist.      Duodenal ulcer with hemorrhage (Chronic)   Suspected anemia contributing to fatigue. Gastroenterologist evaluating for potential gastrointestinal bleeding as a cause of anemia.  Suspected gastrointestinal bleeding under evaluation Suspected gastrointestinal bleeding with recent hematochezia. Gastroenterologist scheduled for capsule endoscopy to evaluate small intestine for bleeding source. - Proceed with scheduled capsule endoscopy on December 8th, 2025, to evaluate for gastrointestinal bleeding. - Okay to stop aspirin  as of December 08 2024.  Would prefer to maintain Plavix  monotherapy but if bleeding worsens, as of January 08, 2025 could hold for bleeding for procedures.               Follow-Up: Return in about 3 weeks (around 11/16/2024) for As scheduled with Dr.  Debera.     Signed, Alm MICAEL Clay, MD, MS Alm Clay, M.D., M.S. Interventional Cardiologist  [facility] Pager # [phone]

## 2024-10-26 NOTE — Patient Instructions (Addendum)
 Medication Instructions:  Your physician has recommended you make the following change in your medication:   DECREASE Metoprolol  (Toprol ) to 12.5 mg daily Continue Imdur  30 mg twice daily ON 12/1 STOP ASPIRIN  ON 03/08/25 STOP PLAVIX , AND RESTART ASPIRIN    *If you need a refill on your cardiac medications before your next appointment, please call your pharmacy*  Lab Work: None ordered  Testing/Procedures: None ordered  Follow-Up: At [facility], you and your health needs are our priority.  As part of our continuing mission to provide you with exceptional heart care, our providers are all part of one team.  This team includes your primary Cardiologist (physician) and Advanced Practice Providers or APPs (Physician Assistants and Nurse Practitioners) who all work together to provide you with the care you need, when you need it.  Your next appointment:   Keep your appointment with Dr. Debera on 11/16/24   Thank you for choosing [facility]!!   [phone]

## 2024-10-29 ENCOUNTER — Encounter: Payer: Self-pay | Admitting: Cardiology

## 2024-10-29 NOTE — Assessment & Plan Note (Addendum)
 Three-vessel disease with widely patent LAD stent and now DEB PTCA to the RCA with moderate disease in the LCx.  He never really had true angina type chest pain symptoms-just mostly noticed exertional dyspnea and fatigue.  The exertional dyspnea part has definitely improved but he still has fatigue. - Continue isosorbide  at 30 mg twice daily. - Reduced metoprolol  dose to half tablet to assess impact on energy levels. - Stop aspirin  on January 1st, 2026, continue Plavix  until then. - Reassess symptoms and vessel status at three-month follow-up.

## 2024-10-29 NOTE — Assessment & Plan Note (Signed)
 Managed by Dr. Debera with combination of fenofibrate  and 60 mg daily, rosuvastatin  10 berry daily and Repatha  every 2 weeks.  Not quite at goal, notable improvement.[name]

## 2024-10-29 NOTE — Assessment & Plan Note (Signed)
 Suspected anemia contributing to fatigue. Gastroenterologist evaluating for potential gastrointestinal bleeding as a cause of anemia.  Suspected gastrointestinal bleeding under evaluation Suspected gastrointestinal bleeding with recent hematochezia. Gastroenterologist scheduled for capsule endoscopy to evaluate small intestine for bleeding source. - Proceed with scheduled capsule endoscopy on December 8th, 2025, to evaluate for gastrointestinal bleeding. - Okay to stop aspirin  as of December 08 2024.  Would prefer to maintain Plavix  monotherapy but if bleeding worsens, as of January 08, 2025 could hold for bleeding for procedures.

## 2024-10-29 NOTE — Assessment & Plan Note (Signed)
 Exertional dyspnea and fatigue felt to be potentially his anginal equivalent.  Post PTCA, he does note dyspnea able to walk up steps without having to stop now but still has a fatigue and leg weakness.  Fatigue possibly related to medication or anemia Fatigue potentially related to metoprolol  or anemia. Metoprolol  dose reduction may improve energy levels. Anemia under evaluation by gastroenterologist. - Reduced metoprolol  dose to half tablet to assess impact on energy levels. - Monitor fatigue levels and reassess if symptoms persist.

## 2024-10-29 NOTE — Assessment & Plan Note (Addendum)
 Prior DES PCI to the LAD with widely patent stent.  Now with DEB PTCA of previously subtotaled RCA. Not a very large RCA, and Myoview  did not suggest inferior ischemia-suggested prior infarct.   However, post PTCA he does note some improvement in his exertional dyspnea.   Otherwise there is no notable disease to treat--moderate disease in the LCx but not likely flow-limiting.  S/p drug-eluting balloon intervention with improved breathing and no significant stenosis on stress test. Current vessel size is 2.25 mm, small for stenting. Recent trial supports drug-eluting balloon as effective as stent for small vessels. No immediate need for re-intervention unless symptoms change. Original plans to relook in 2 to 3 months probably unnecessary based on recent studies indicating noninferiority between DEB PTCA and DES PCI for small vessels. Okay to stop aspirin  as of January (3 months post PTCA) With recent PTCA, would need to be on DAPT ASA/Plavix  for at least 3 months, but would prefer to stay on maintenance Plavix  monotherapy longer-term.

## 2024-11-07 ENCOUNTER — Other Ambulatory Visit: Payer: Self-pay | Admitting: Cardiology

## 2024-11-11 MED ORDER — POTASSIUM CHLORIDE ER 10 MEQ PO TBCR: 10.0000 meq | EXTENDED_RELEASE_TABLET | Freq: Every day | ORAL | 3 refills | Status: AC

## 2024-11-14 ENCOUNTER — Ambulatory Visit (HOSPITAL_COMMUNITY)
Admission: RE | Admit: 2024-11-14 | Discharge: 2024-11-14 | Disposition: A | Discharge status: Home / Self Care | Attending: Internal Medicine | Admitting: Internal Medicine

## 2024-11-14 ENCOUNTER — Encounter (HOSPITAL_COMMUNITY): Admission: RE | Disposition: A | Payer: Self-pay | Source: Home / Self Care | Attending: Internal Medicine

## 2024-11-14 HISTORY — PX: GIVENS CAPSULE STUDY: SHX5432

## 2024-11-14 SURGERY — IMAGING PROCEDURE, GI TRACT, INTRALUMINAL, VIA CAPSULE

## 2024-11-15 ENCOUNTER — Encounter (HOSPITAL_COMMUNITY): Payer: Self-pay | Admitting: Internal Medicine

## 2024-11-16 ENCOUNTER — Encounter: Payer: Self-pay | Admitting: Cardiology

## 2024-11-16 ENCOUNTER — Ambulatory Visit: Attending: Cardiology | Admitting: Cardiology

## 2024-11-16 VITALS — BP 128/78 | HR 68 | Ht 75.0 in | Wt 219.8 lb

## 2024-11-16 DIAGNOSIS — I25119 Atherosclerotic heart disease of native coronary artery with unspecified angina pectoris: Secondary | ICD-10-CM

## 2024-11-16 DIAGNOSIS — I10 Essential (primary) hypertension: Secondary | ICD-10-CM | POA: Diagnosis not present

## 2024-11-16 DIAGNOSIS — Z79899 Other long term (current) drug therapy: Secondary | ICD-10-CM

## 2024-11-16 DIAGNOSIS — E782 Mixed hyperlipidemia: Secondary | ICD-10-CM

## 2024-11-16 MED ORDER — METOPROLOL SUCCINATE ER 25 MG PO TB24: 12.5000 mg | ORAL_TABLET | Freq: Every day | ORAL | Status: AC

## 2024-11-16 MED ORDER — ASPIRIN 81 MG PO TBEC: 81.0000 mg | DELAYED_RELEASE_TABLET | Freq: Every day | ORAL | Status: AC

## 2024-11-16 NOTE — Patient Instructions (Addendum)
 Medication Instructions:  Your physician has recommended you make the following change in your medication:  Stop aspirin  on 12/08/2024. Continue all other medications as prescribed  Labwork: Your physician recommends that you return for a FASTING lipid profile. Please do not eat or drink for at least 8 hours when you have this done. You may take your medications that morning with a sip of water . [employer] ([phone] [place])  Testing/Procedures: none  Follow-Up: Your physician recommends that you schedule a follow-up appointment in: 3 months  Any Other Special Instructions Will Be Listed Below (If Applicable).  If you need a refill on your cardiac medications before your next appointment, please call your pharmacy.

## 2024-11-16 NOTE — Progress Notes (Signed)
 Cardiology Office Note  Date: 11/16/2024   ID: Denarius, Sesler December 01, 1966, MRN [id number]  History of Present Illness: KEELEN QUEVEDO is a 58 y.o. male that I saw in October.  He was referred for a cardiac catheterization at that time given concerns for accelerating angina.  Procedure was performed by Dr. Anner with finding of patent proximal to mid LAD DES site, moderate stenosis within the circumflex distribution, and a 99% ostial to proximal RCA that was treated with drug-coated balloon angioplasty with residual 30% stenosis (small 2.25 mm vessel).  He had a follow-up visit with Dr. Anner in mid November with plan to continue medical therapy (DAPT for 3 months and Plavix  monotherapy thereafter).  Beta-blocker dose was reduced at that time as well. He tells me today that he has been feeling better, less dyspnea on exertion, no nitroglycerin  use.  Does have somewhat more energy.  Chart review also finds recent capsule endoscopy by Dr. Shaaron.  He is being evaluated for anemia.  We went over his medications and discussed plan to continue medical therapy and observation at this point given relative improvement in cardiac symptoms.  He does need follow-up lipid profile to see if LDL has continued to improve.  Physical Exam: VS:  BP 128/78 (BP Location: Left Arm)   Pulse 68   Ht 6' 3 (1.905 m)   Wt 219 lb 12.8 oz (99.7 kg)   SpO2 94%   BMI 27.47 kg/m , BMI Body mass index is 27.47 kg/m.  Wt Readings from Last 3 Encounters:  11/16/24 219 lb 12.8 oz (99.7 kg)  10/26/24 212 lb 9.6 oz (96.4 kg)  10/05/24 217 lb (98.4 kg)    General: Patient appears comfortable at rest. HEENT: Conjunctiva and lids normal. Neck: Supple, no elevated JVP or carotid bruits. Lungs: Clear to auscultation, nonlabored breathing at rest. Cardiac: Regular rate and rhythm, no S3 or significant systolic murmur. Extremities: No pitting edema.  ECG:  An ECG dated 10/26/2024 was personally reviewed  today and demonstrated:  Normal sinus rhythm.  Labwork: 01/13/2024: TSH 0.314 10/06/2024: BUN 11; Creatinine, Ser 1.10; Potassium 3.6; Sodium 136 10/12/2024: Hemoglobin 13.4; Platelets 314  August 2025: Cholesterol 163, triglycerides 274, HDL 40, LDL 78  Other Studies Reviewed Today:  Cardiac catheterization and PCI 10/05/2024:   Previously placed DES in the prox LAD to Mid LAD is widely patent   1st Diag lesion is 40% stenosed.   Mid Cx to Dist Cx lesion is 60% stenosed.  Stable from last cath   Ost RCA to Prox RCA lesion is 99% stenosed.   Drug-coated balloon angioplasty was performed using a BALL DC AGENT 2.25X30.  Post intervention, there is a 30% residual stenosis.  Assessment and Plan:  1.  CAD status post DES to the LAD in December 2024.  Most recent cardiac catheterization in October obtained in the setting of accelerating angina indicated patent LAD stent site with 99% ostial to proximal RCA stenosis which was managed with drug coated balloon angioplasty by Dr. Anner (small 2.25 mm vessel).  He has had symptomatic improvement, less dyspnea on exertion and no interval nitroglycerin  use.  Plan to continue aspirin  81 mg daily for now but discontinue in January 2026, stay on Plavix  75 mg daily, and continue Toprol -XL 12.5 mg daily.  2.  Mixed hyperlipidemia.  LDL 78 in August.  He is on Crestor  10 mg daily and Repatha  140 mg every 14 days.  Plan to recheck FLP, if LDL has  not continued to move toward goal, Crestor  can be further uptitrated.   3.  Primary hypertension.  Continue to track blood pressure.  4.  Iron deficiency anemia with history of hematochezia.  He underwent capsule endoscopy this week with Dr. Shaaron and continues on supplemental iron.  Recent hemoglobin obtained by PCP 11.8.  Disposition:  Follow up 3 months.  Signed, Jayson JUDITHANN Sierras, M.D., F.A.C.C. [facility] at [facility]

## 2024-11-18 NOTE — Op Note (Incomplete)
°  Small Bowel Givens Capsule Study Procedure date:  11/14/2024  Referring Provider:  Sonny RAMAN. Ezzard RIGGERS PCP:  Dr. Shona, Norleen PEDLAR, MD  Indication for procedure:  Iron deficiency anemia, intermittent rectal bleeding and black stools (on iron). EGD 05/2024 with unchanged short segment Barrett's esophagus no dysplasia, normal duodenal bulb and second portion of duodenum. Colonoscopy 04/2022 with nonbleeding internal hemorrhoids, one 8mm tubular adenoma removed from splenic flexure.   Patient data:  Wt: 215   Ht: 6'3  Findings:  Small bowel capsule complete. Capsule reached the colon. No active bleeding noted. Scattered areas of mucosal disruption in stomach and throughout the distal small bowel. At least 12 sites noted, for examples see the pictures below. Single site noted in the stomach. Small bowel sites noted starting around 76% small bowel progression through 94% small bowel progression.  First Gastric image:  47 sec First Duodenal image: 22 min 41 sec First Ileo-Cecal Valve image: 2 hr 30 sec First Cecal image: 2 hr 16 min 46 sec Gastric Passage time: 21 min Small Bowel Passage time:  1 hr 54 min  Summary & Recommendations: Scattered areas of mucosal disruption in the setting of plavix  and ASA 81mg  daily. May contribute to IDA picture. Likely does not explain his rectal bleeding. Rectal bleeding could be due to internal hemorrhoids. Pertinent images reviewed by Dr. Shaaron.  Will continue to monitor his anemia periodically. Recent labs from PCP 12/9 with Hgb 11.8, iron 48, iron sat 11. Recommend no NSAIDS. He will continue ASA 81mg  daily along with his Plavix  due to recent CAD intervention.  Hemorrhoid banding could be an option if ongoing rectal bleeding, likely would need to be able to hold Plavix  for several days however.        Sonny RAMAN. Ezzard RIGGERS [facility] 12/15/20257:49 AM  Attending note: Pertinent images reviewed.  Agree with assessment and  recommendations

## 2024-11-21 ENCOUNTER — Telehealth: Payer: Self-pay | Admitting: Gastroenterology

## 2024-11-21 NOTE — Telephone Encounter (Signed)
 Please let patient know his small bowel capsule results:  Scattered areas of mucosal disruption in the setting of plavix  and ASA 81mg  daily. May contribute to IDA picture. Likely does not explain his rectal bleeding. Rectal bleeding could be due to internal hemorrhoids. Pertinent images reviewed by Dr. Shaaron.  Will continue to monitor his anemia periodically. Last Hgb improved and normal on 10/12/24. Recommend no NSAIDS. He will continue ASA 81mg  daily along with his Plavix  due to recent CAD intervention.  Hemorrhoid banding could be an option if ongoing rectal bleeding, likely would need to be able to hold Plavix  for several days however.   Tammy:  Please ask patient if he is taking any NSAIDS, specifically inquire about ibuprofen (Advil), aspirin  powders, naprosyn (Aleve). His Hgb has decreased but similar to baseline at end of 09/2024 (not sure if the 13 in November was outlier). He needs to stop all NSAIDS, can use ASA 81mg  daily as per cardiology recommendations.   -We will need to check his labs in four weeks (CBC, iron/tibc/ferritin). -He needs to take ferrous sulfate  325mg  every other day (increased absorption can occur with every other day as opposed to every day dosing).  -He should have follow up with Dr. Shaaron in 6 weeks. We may need to consider updaing colonoscopy.

## 2024-11-22 ENCOUNTER — Other Ambulatory Visit: Payer: Self-pay

## 2024-11-22 DIAGNOSIS — D500 Iron deficiency anemia secondary to blood loss (chronic): Secondary | ICD-10-CM

## 2024-11-22 NOTE — Telephone Encounter (Signed)
 Pt was made aware and verbalized understanding. Pt stated that he does not take any NSAIDs and currently is not taking aspirin  81 mg per cardiologist instructions. Labs were ordered and will be mailed to the pt to have done in 4 weeks.  Ladonna: arrange f/u with Dr. Shaaron in 6 weeks.

## 2024-11-23 NOTE — Telephone Encounter (Signed)
 noted

## 2024-12-05 ENCOUNTER — Other Ambulatory Visit: Payer: Self-pay

## 2024-12-05 DIAGNOSIS — D500 Iron deficiency anemia secondary to blood loss (chronic): Secondary | ICD-10-CM

## 2025-01-03 ENCOUNTER — Ambulatory Visit: Admitting: Internal Medicine

## 2025-01-17 ENCOUNTER — Ambulatory Visit: Admitting: Internal Medicine

## 2025-02-14 ENCOUNTER — Ambulatory Visit: Payer: Self-pay | Admitting: Cardiology
